# Patient Record
Sex: Male | Born: 1950
Health system: Southern US, Community
[De-identification: ages and names within clinical notes are randomized; demographics above are authoritative.]

## PROBLEM LIST (undated history)

## (undated) DIAGNOSIS — K429 Umbilical hernia without obstruction or gangrene: Secondary | ICD-10-CM

## (undated) DIAGNOSIS — E119 Type 2 diabetes mellitus without complications: Secondary | ICD-10-CM

## (undated) DIAGNOSIS — I10 Essential (primary) hypertension: Secondary | ICD-10-CM

## (undated) DIAGNOSIS — J302 Other seasonal allergic rhinitis: Secondary | ICD-10-CM

## (undated) DIAGNOSIS — E7800 Pure hypercholesterolemia, unspecified: Secondary | ICD-10-CM

## (undated) HISTORY — DX: Type 2 diabetes mellitus without complications: E11.9

---

## 2001-02-27 ENCOUNTER — Encounter: Payer: Self-pay | Admitting: Family Medicine

## 2001-02-27 ENCOUNTER — Ambulatory Visit (HOSPITAL_COMMUNITY): Admission: RE | Admit: 2001-02-27 | Discharge: 2001-02-27 | Payer: Self-pay | Admitting: Family Medicine

## 2001-03-06 ENCOUNTER — Ambulatory Visit (HOSPITAL_COMMUNITY): Admission: RE | Admit: 2001-03-06 | Discharge: 2001-03-06 | Payer: Self-pay | Admitting: Family Medicine

## 2001-03-06 ENCOUNTER — Encounter: Payer: Self-pay | Admitting: Family Medicine

## 2002-06-03 ENCOUNTER — Ambulatory Visit (HOSPITAL_COMMUNITY): Admission: RE | Admit: 2002-06-03 | Discharge: 2002-06-03 | Payer: Self-pay | Admitting: Family Medicine

## 2002-06-03 ENCOUNTER — Encounter: Payer: Self-pay | Admitting: Family Medicine

## 2004-06-22 ENCOUNTER — Ambulatory Visit (HOSPITAL_COMMUNITY): Admission: RE | Admit: 2004-06-22 | Discharge: 2004-06-22 | Payer: Self-pay | Admitting: Family Medicine

## 2005-02-22 ENCOUNTER — Ambulatory Visit (HOSPITAL_COMMUNITY): Admission: RE | Admit: 2005-02-22 | Discharge: 2005-02-22 | Payer: Self-pay | Admitting: Gastroenterology

## 2005-08-31 ENCOUNTER — Ambulatory Visit (HOSPITAL_COMMUNITY): Admission: RE | Admit: 2005-08-31 | Discharge: 2005-08-31 | Payer: Self-pay | Admitting: Internal Medicine

## 2006-05-20 ENCOUNTER — Emergency Department (HOSPITAL_COMMUNITY): Admission: EM | Admit: 2006-05-20 | Discharge: 2006-05-20 | Payer: Self-pay | Admitting: Emergency Medicine

## 2007-12-17 ENCOUNTER — Inpatient Hospital Stay (HOSPITAL_COMMUNITY): Admission: RE | Admit: 2007-12-17 | Discharge: 2007-12-20 | Payer: Self-pay | Admitting: Orthopedic Surgery

## 2007-12-17 HISTORY — PX: TOTAL HIP ARTHROPLASTY: SHX124

## 2008-01-31 ENCOUNTER — Encounter (HOSPITAL_COMMUNITY): Admission: RE | Admit: 2008-01-31 | Discharge: 2008-03-01 | Payer: Self-pay | Admitting: Orthopedic Surgery

## 2008-03-04 ENCOUNTER — Encounter (HOSPITAL_COMMUNITY): Admission: RE | Admit: 2008-03-04 | Discharge: 2008-04-08 | Payer: Self-pay | Admitting: Orthopedic Surgery

## 2008-04-10 ENCOUNTER — Ambulatory Visit: Admission: RE | Admit: 2008-04-10 | Discharge: 2008-04-10 | Payer: Self-pay | Admitting: Orthopedic Surgery

## 2008-06-10 ENCOUNTER — Emergency Department (HOSPITAL_COMMUNITY): Admission: EM | Admit: 2008-06-10 | Discharge: 2008-06-10 | Payer: Self-pay | Admitting: Emergency Medicine

## 2008-06-16 ENCOUNTER — Ambulatory Visit (HOSPITAL_BASED_OUTPATIENT_CLINIC_OR_DEPARTMENT_OTHER): Admission: RE | Admit: 2008-06-16 | Discharge: 2008-06-16 | Payer: Self-pay | Admitting: Orthopedic Surgery

## 2008-06-16 HISTORY — PX: ORIF PATELLA FRACTURE: SUR947

## 2008-08-05 ENCOUNTER — Encounter (HOSPITAL_COMMUNITY): Admission: RE | Admit: 2008-08-05 | Discharge: 2008-09-11 | Payer: Self-pay | Admitting: Orthopedic Surgery

## 2009-08-20 IMAGING — CR DG CHEST 2V
2 series · 2 of 2 positions shown · non-contrast
Comparison: 06/22/2004

CLINICAL DATA: Preoperative respiratory exam for hip surgery.
Hypertension.  Smoking history.

CHEST - 2 VIEW

[view not recorded (1 of 2)]
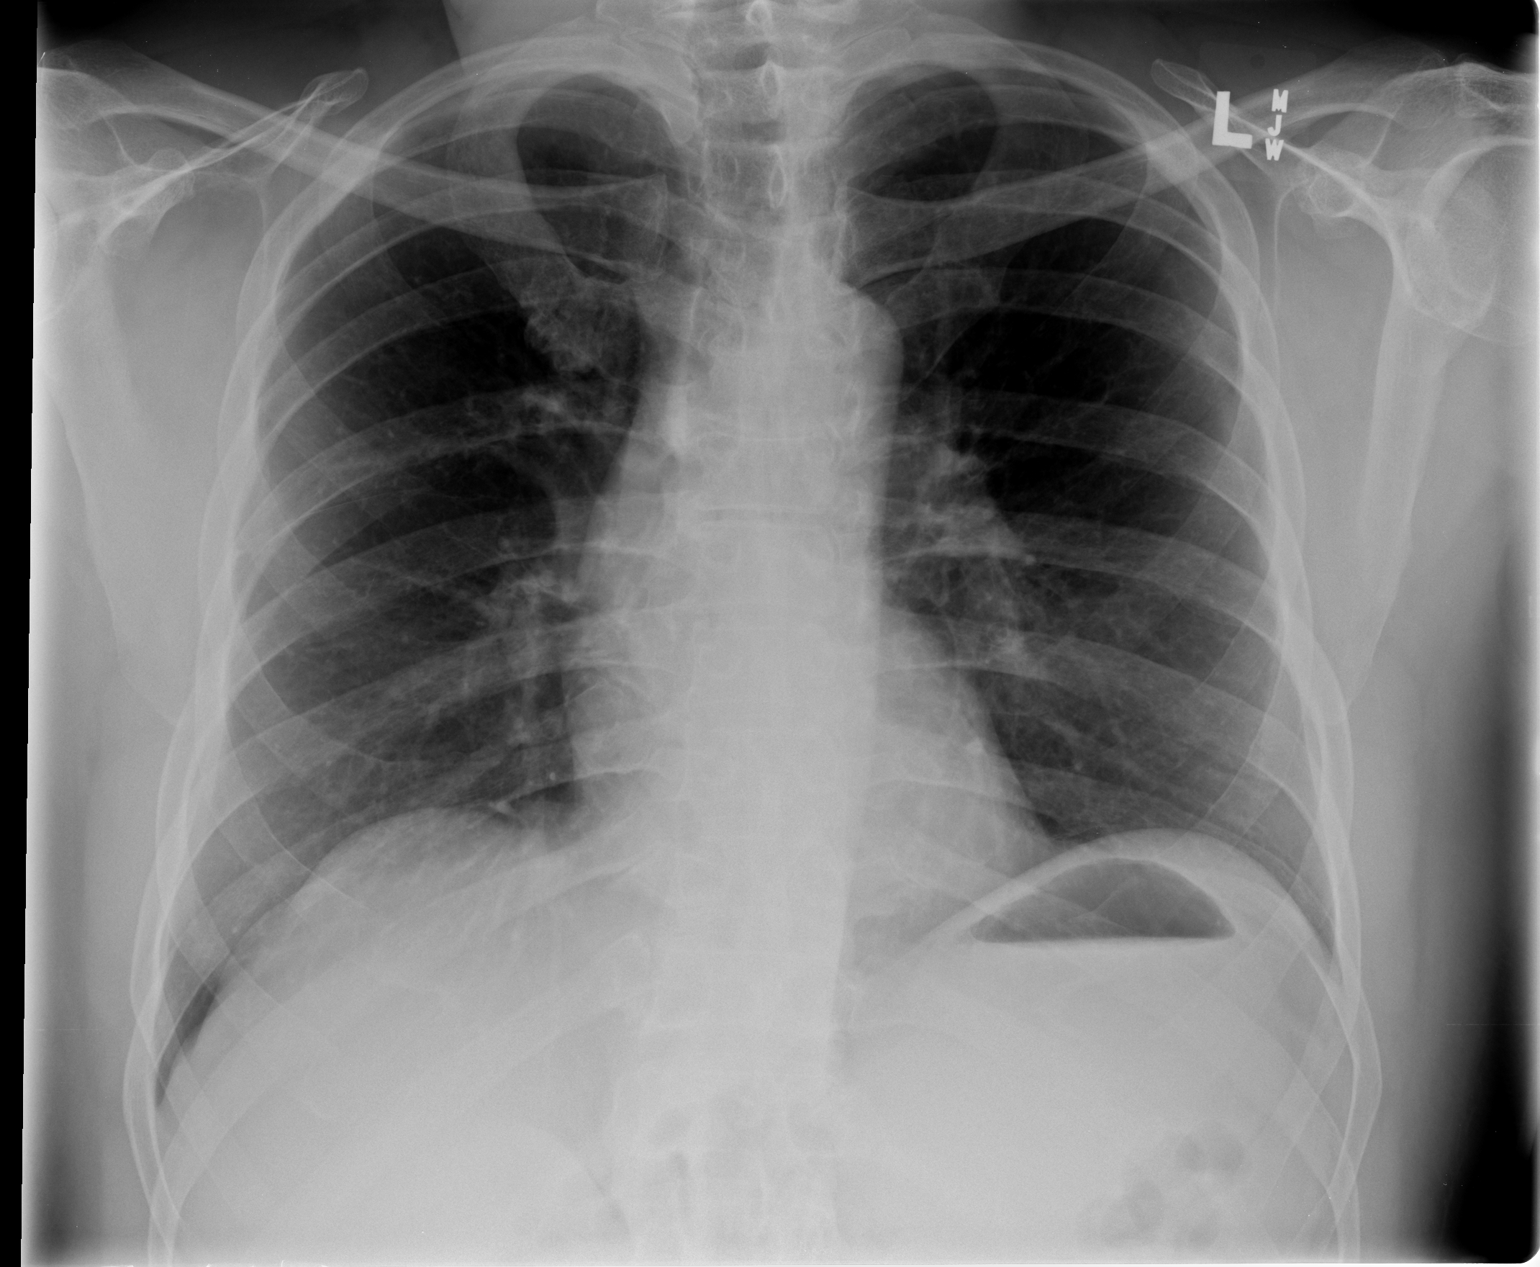

[view not recorded (2 of 2)]
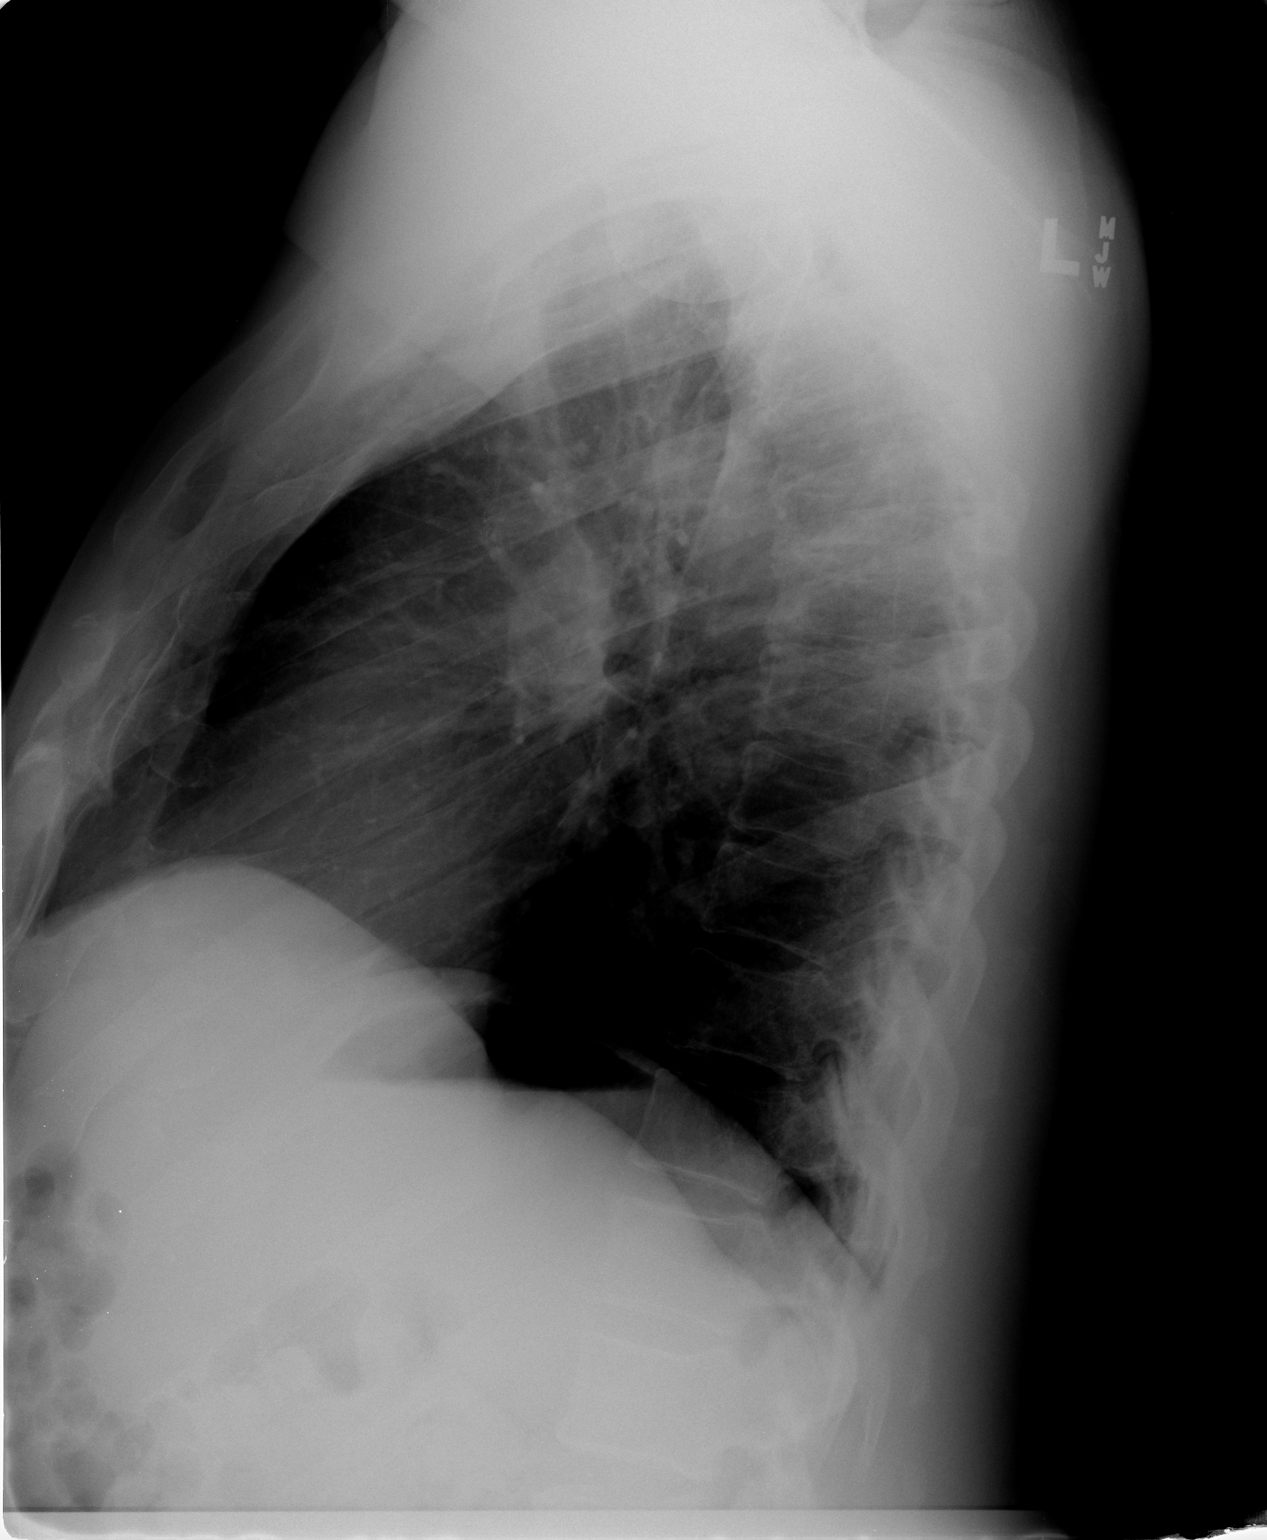

[2 of 2 positions shown; findings below may reference images not displayed]

FINDINGS: Heart size is normal.  There is mild pulmonary scarring
at the bases.  No active infiltrate, mass, effusion or collapse.
No significant bony finding.
IMPRESSION: No active disease

## 2010-02-18 IMAGING — CR DG KNEE COMPLETE 4+V*R*
4 series · 4 of 4 positions shown · non-contrast
Comparison: None.

CLINICAL DATA: Fell and injured right knee.

RIGHT KNEE - COMPLETE 4+ VIEW 06/10/2008:

[view not recorded (1 of 4)]
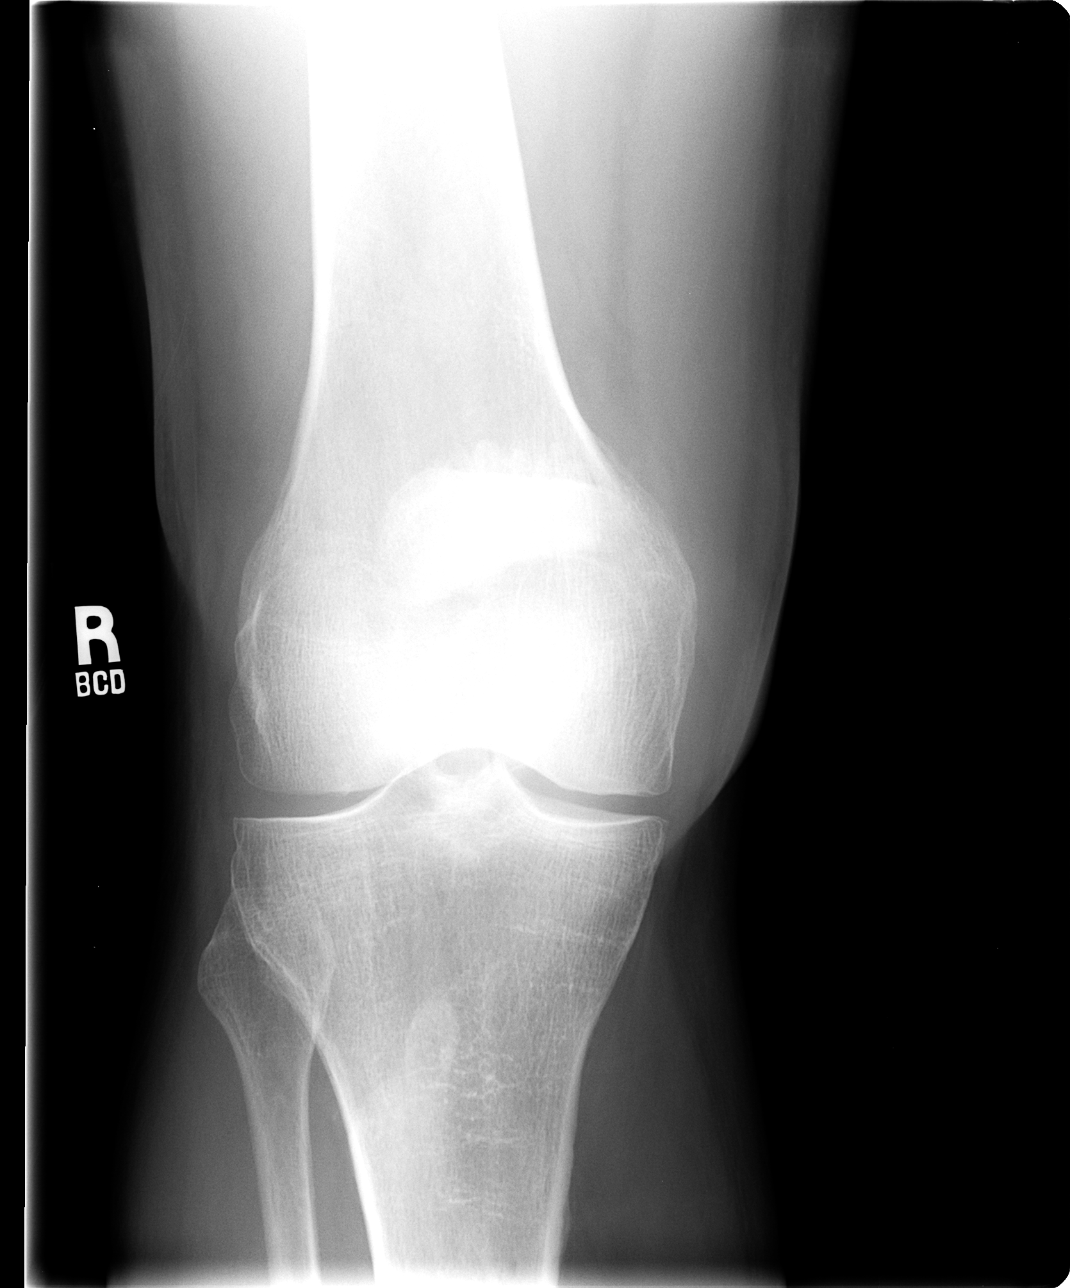

[view not recorded (2 of 4)]
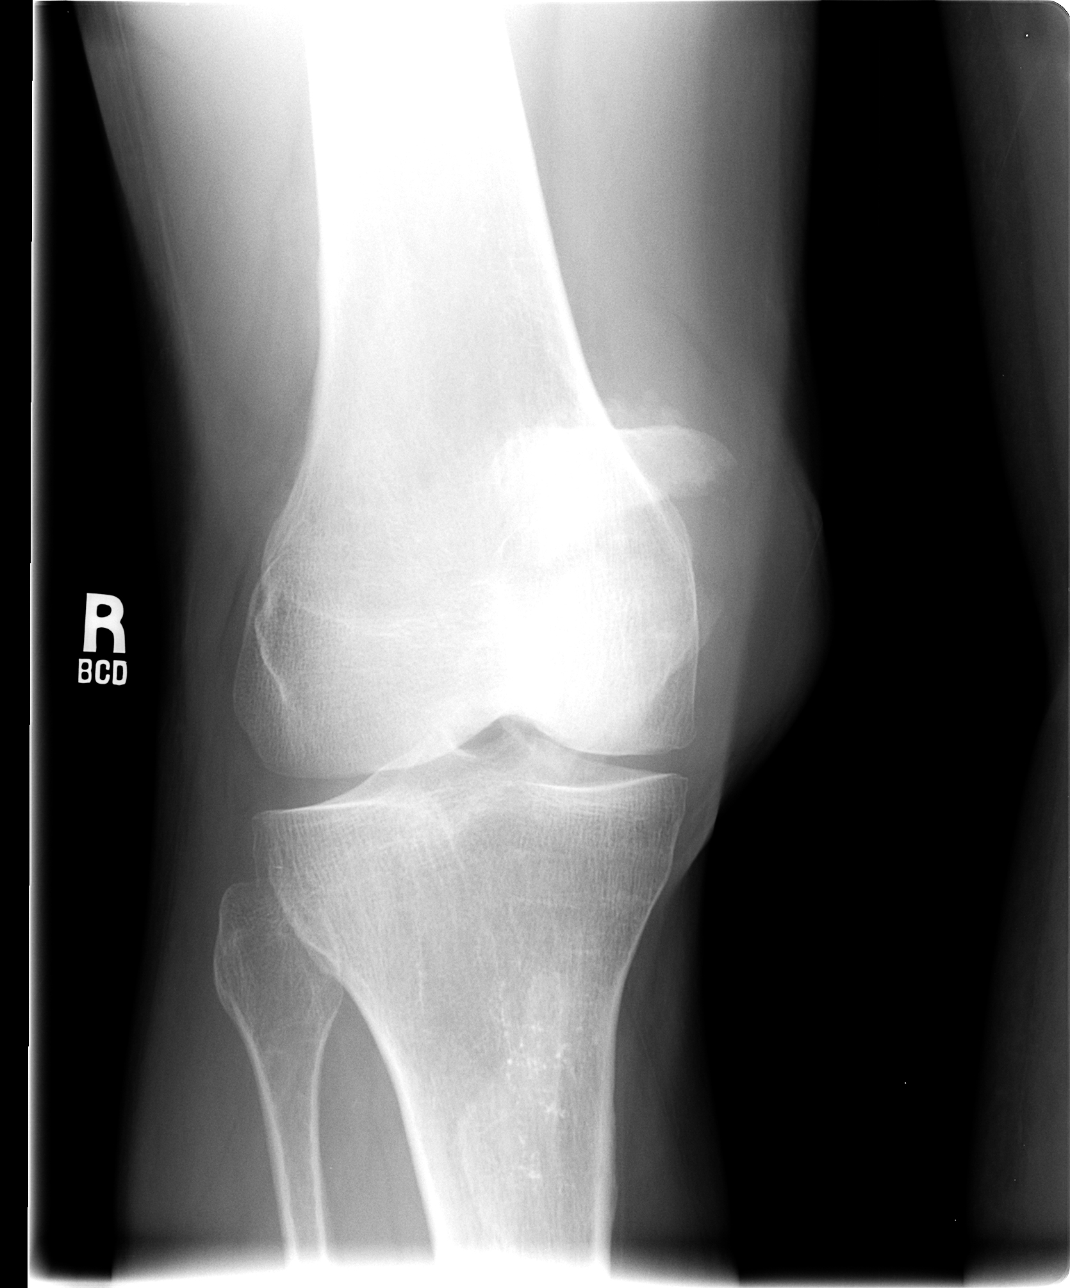

[view not recorded (3 of 4)]
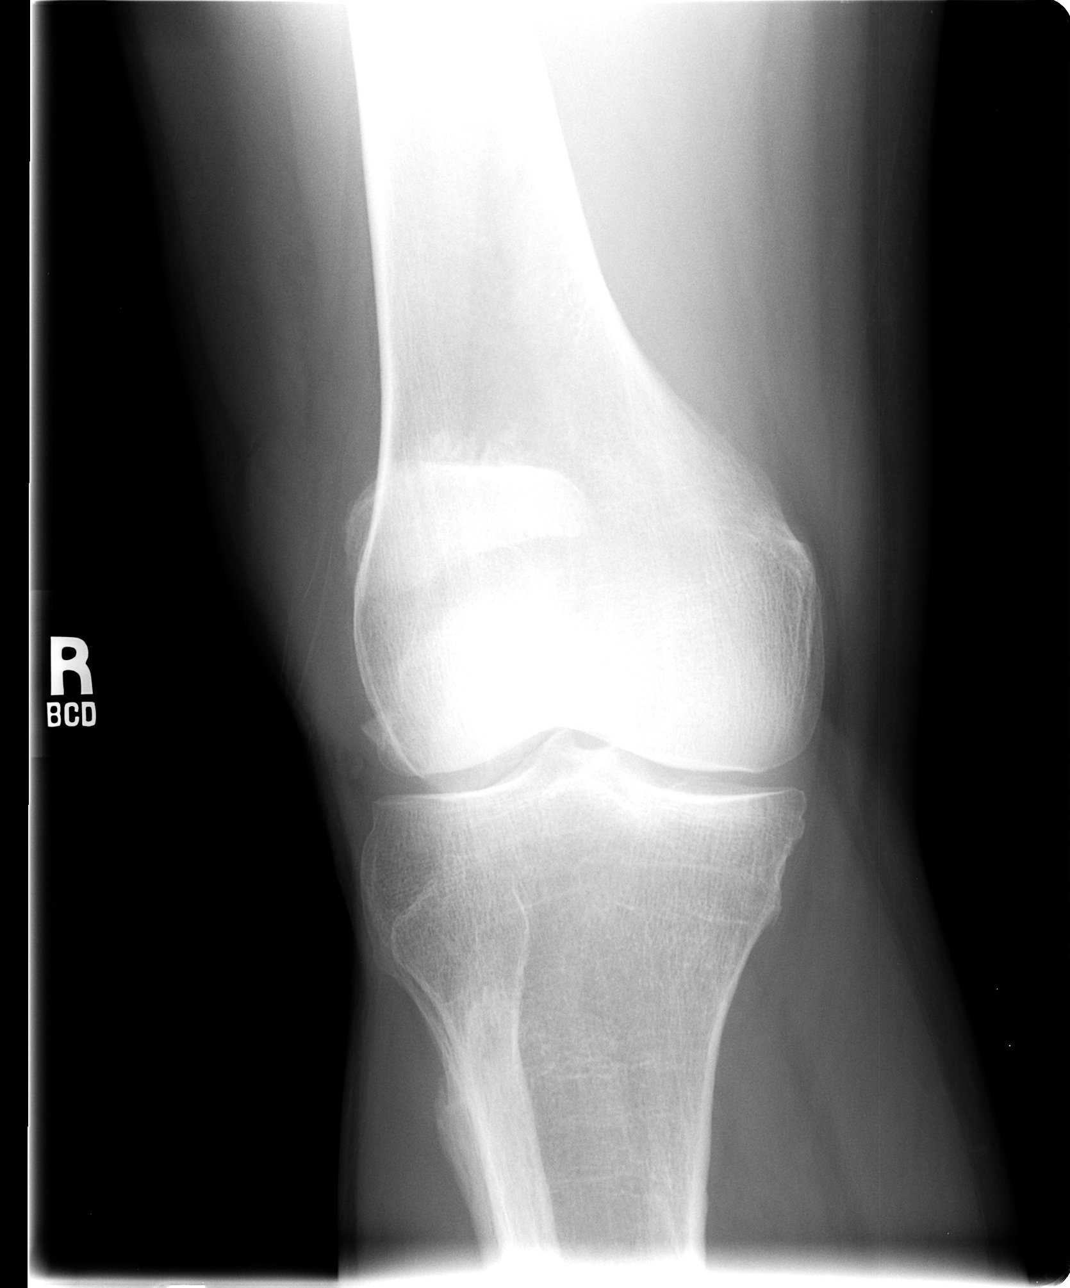

[view not recorded (4 of 4)]
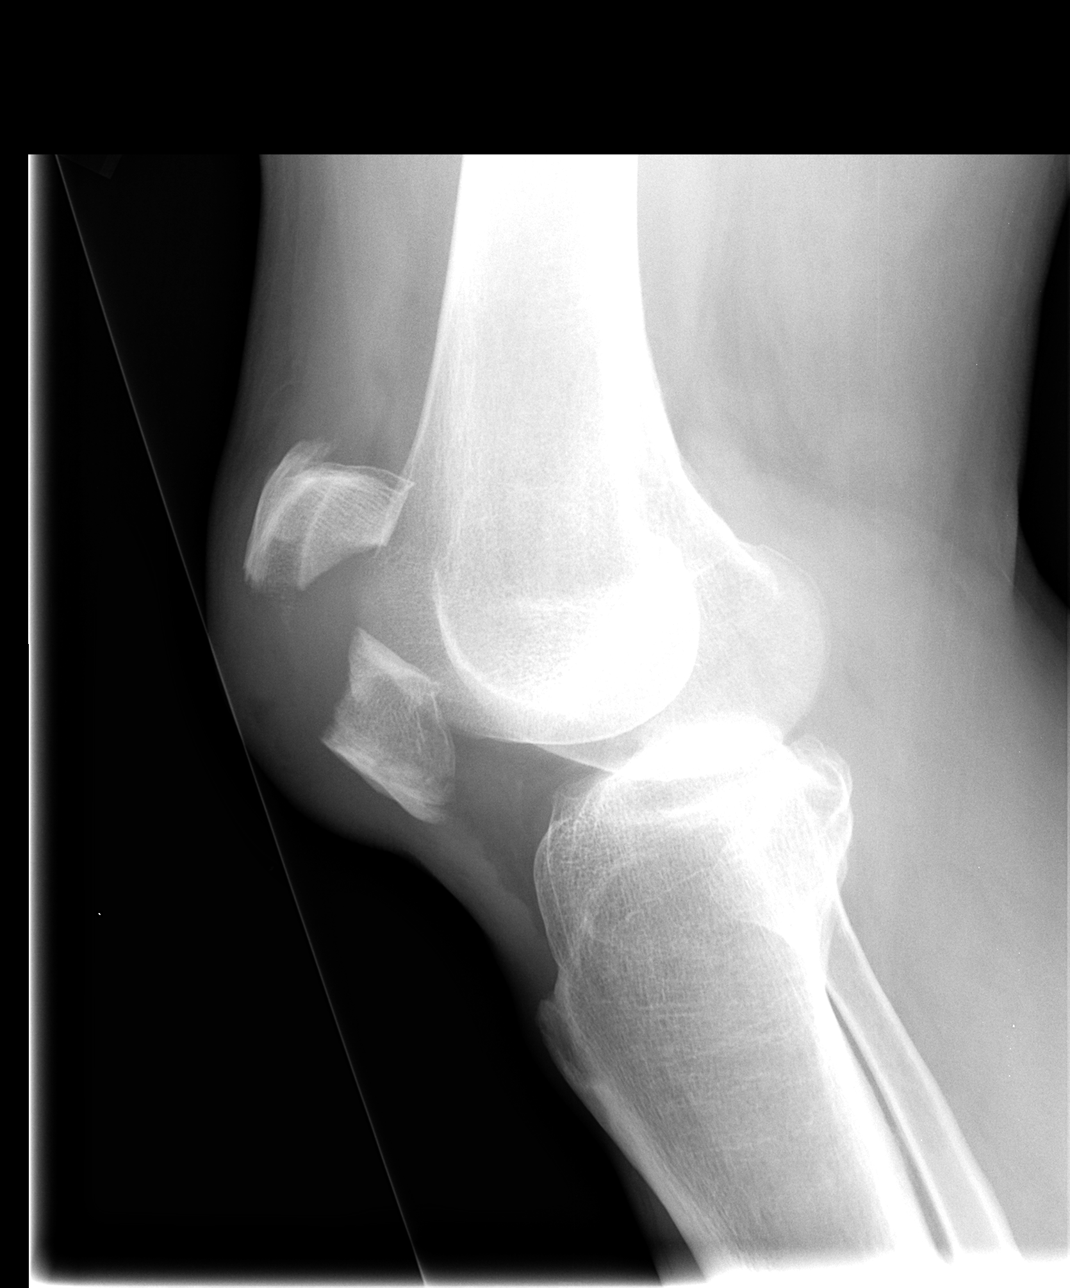

[4 of 4 positions shown; findings below may reference images not displayed]

FINDINGS: Fracture through the mid portion of the patella, with
just over 2 cm of distraction of the fracture fragments. Very large
joint effusion/hemarthrosis, and large hematoma in the subcutaneous
tissues anterior to the knee.  Ossification at the insertion of the
patellar and quadriceps tendons on both the inferior and superior
patella.  Prominent tibial tubercle.  No fractures elsewhere.
IMPRESSION: Fracture through the mid portion of the patella with just over 2 cm
of distraction of the fracture fragments.

## 2010-11-23 NOTE — Op Note (Signed)
NAMEGOR, Andres [name]               ACCOUNT NO.:  [id number]   MEDICAL RECORD NO.:  [id number]          PATIENT TYPE:  AMB   LOCATION:  DSC                          FACILITY:  [facility]   PHYSICIAN:  Feliberto Gottron. Turner Daniels, M.D.   DATE OF BIRTH:  October 03, 1950   DATE OF PROCEDURE:  06/16/2008  DATE OF DISCHARGE:                               OPERATIVE REPORT   PREOPERATIVE DIAGNOSIS:  Right patella fracture, displaced.   POSTOPERATIVE DIAGNOSIS:  Right patella fracture, displaced.   PROCEDURE:  Open reduction and internal fixation right patella fracture  using two parallel 0.062 K-wires and a double figure-of-eight #5  FiberWire loop.   SURGEON:  Feliberto Gottron.  Turner Daniels, MD   FIRST ASSISTANT:  Shirl Harris PA-C   ANESTHETIC:  General LMA.   ESTIMATED BLOOD LOSS:  100 mL.   FLUID REPLACEMENT:  1500 mL of crystalloid.   DRAINS PLACED:  None.   TOURNIQUET TIME:  45 minutes.   INDICATIONS FOR PROCEDURE:  A 60 year old man who fell and sustained a  displaced right patella fracture about 6 days ago.  Placed in a knee  immobilizer and prepared for reduction and stabilization of his patella.  He has no quad mechanism unless the patella is fixed.  Risks and  benefits of surgery were discussed and questions answered.   DESCRIPTION OF PROCEDURE:  The patient identified by armband, taken to  the operating room at [facility] after first receiving 2 g  of Ancef IV.  Appropriate anesthetic monitors were attached and general  LMA anesthesia induced with the patient in supine position.  A  tourniquet was applied high to the right thigh and the right lower  extremity prepped and draped in usual sterile fashion from the ankle to  the tourniquet.  Standard time-out procedure was then performed.  The  limb wrapped with an Esmarch bandage, tourniquet inflated to 350 mmHg.  The knee bent and we began the procedure by making the anterior midline  incision starting about 4 cm above the patella going  over the patella  and about 4 cm distal to the patella at the level of the tibial  tubercle.  Small bleeders in the skin and subcutaneous tissue identified  and cauterized.  Transverse retinaculum was reflected medially and  laterally exposing the fracture.  Early callus material was removed from  the fracture site as was periosteum and the edges were cleaned up  allowing basically an anatomic reduction.  It was primarily just a two-  part fracture with some small fragments laterally that were easily  debrided.  Indirect reduction was accomplished lining up the external  edges of the patella.  We could easily pass our finger into the joint  from the retinacular tears medially and laterally to confirm the  reduction.  Parallel 0.062 K-wires were then placed from superior in the  patella through the fracture site coming out inferiorly, one medial and  one lateral.  This provisionally stabilized the fracture and again there  was near anatomic to palpation palpating through the retinaculum and  cartilage.  We then fixed this with  two figure-of-eight #5 FiberWire  throwing at least 7-1/2 inches in each figure-of-eight loop after  bending the knee and tensioning it with two 1/2 inches.  After the  figure-of-eight FiberWire loops had been applied, the ends of the pins  were bent and cut at 90 degrees and then buried into the tendon.  C-arm  images were taken on the lateral plane confirming baseplate near  anatomic reduction.  They were taken at 0 and 45 degrees of flexion and  at 45 degrees flexion, the tension band were perfectly further reducing  the fracture from less than a millimeter to near invisible.  At this  point, the tourniquet was let down.  The wound irrigated out with normal  saline solution.  The retinacular tears medially and laterally were  closed with running #1 Vicryl suture, the subcutaneous tissue with 0  running undyed Vicryl suture and 2-0 undyed Vicryl suture and the  skin  with skin staples.  A dressing of Xeroform, 4x4 dressing sponges,  Webril, Ace wrap, a cool wrap and a knee immobilizer were then applied.  The patient was then awakened and taken to the recovery room without  difficulty.      Feliberto Gottron. Turner Daniels, M.D.  Electronically Signed     FJR/MEDQ  D:  06/16/2008  T:  06/17/2008  Job:  [id number]

## 2010-11-23 NOTE — Op Note (Signed)
NAMEDEMPSEY, AHONEN               ACCOUNT NO.:  [id number]   MEDICAL RECORD NO.:  [id number]          PATIENT TYPE:  INP   LOCATION:  5006                         FACILITY:  [facility]   PHYSICIAN:  Feliberto Gottron. Turner Daniels, M.D.   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  This would be a complex total hip.   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  This would be a complex total hip.   PROCEDURE:  Total hip arthroplasty using a DePuy 58-mm ASR cup, 22 x 19  x 175 x 42 inches S-ROM stem, 22D large cone, and NK+0 51-mm Ultimate  head.   SURGEON:  Feliberto Gottron.  Turner Daniels, MD   FIRST ASSISTANT:  Shirl Harris, PA-C   ANESTHESIA:  General endotracheal.   ESTIMATED BLOOD LOSS:  500 mL.   FLUID REPLACEMENT:  1500 mL of crystalloid.   URINE OUTPUT:  300 mL.   DRAINS:  Foley catheter.   INDICATIONS FOR PROCEDURE:  This is a 60 year old male with end-stage  arthritis of his left hip secondary to an injury as a child which has  left him with bone-on-bone arthritic changes, huge peripheral  osteophytes, and little for any motion.  The hip joint has at least a 45-  degree flexion contracture and severe unremitting pain.  In order to  decrease pain and increase function, he desires elective left total hip  arthroplasty.  Risks and benefits of surgery were discussed.  Questions  answered.   DESCRIPTION OF PROCEDURE:  The patient was identified by armband and  taken to the operating room at [facility].  Appropriate  anesthetic  monitors were attached and general endotracheal anesthesia  induced with the patient in supine position.  He was then rolled into  the right lateral decubitus position and fixed there with a Stulberg  Mark II pelvic clamp.  After insertion of a Foley catheter, he did  receive IV antibiotics preoperatively.  Once the pelvis was in the  vertical in the Stulberg clamp, the left lower extremity was prepped and  draped in usual sterile fashion from the ankle to the hemipelvis and the  skin along the lateral hip and thigh infiltrated with 20 mL of  0.5%Marcaine and epinephrine solution.  We then began the procedure by  making a posterolateral approach with the hip centered over the greater  trochanter 20 cm in length cutting through the skin and subcutaneous  tissue down to the level of the IT band which was then cut along with  skin incision exposing the greater trochanter.  A Cobra retractor was  then placed between the gluteus minimus and the superior hip joint  capsule which include a large superior osteophyte and a second between  the inferior hip joint capsule and the quadratus femoris, again  including a large osteophyte.  This allowed [place] to isolate the piriformis  and short external rotators which were then cut off their insertion on  the intertrochanteric crest.  Likewise, we incised the capsule posterior-  superior going out over the femoral neck and then out inferior-posterior  and tagged this with two #2 Ethibond sutures.  This exposed the  posterior aspect of the acetabulum which had a very large peripheral  osteophyte that had to be removed using an osteotome before we could  even see the femoral head and neck.  This went all the way from superior  to inferior and required 2 passes with the osteotome, removing about 1/2  inch of bone around the rim.  Once this had been accomplished, we  further loosened up the soft tissue superiorly and inferiorly and then  flexed, internally  rotated the hip dislocating the femoral head which  was grossly misshapen.  A standard neck cut was then performed 1  fingerbreadth above the lesser trochanter, giving [place] a good exposure of  the acetabulum.  More peripheral osteophytes were taken out anteriorly,  some up to an inch in size enhancing our exposure of the anterior column  in the anterior aspect of the acetabulum.  A posterior-inferior ring  retractor was then placed at the junction of the ischium and the  acetabulum and inferior Cobra and a superior Homan levering off the  anterior column bringing the femur forward.  We then sequentially reamed  up to a 57-mm basket reamer, touched up the edge with a 58-mm reamer in  preparation for a 58-mm ASR cup which was then inserted with the sharp  hammer blows and 45 degrees of abduction and about 20 degrees of  anteversion.  Even after the cup had been inserted, we removed more  osteophytes some up to 1/2 inch in length from anterior, posterior, and  inferior which also would allow for much better motion of the femur.  The hip was then flexed and internally rotated exposing the proximal  femur.  We opened with a box cutting chisel followed by the initiator  followed by axial reaming up to a 17-mm axial reamer, getting our first  shatter at 16.5.  We then reamed up to 17.5 full depth and touched the  proximal end of the femur for the first entry so with the 18 mm reamer.  We then conically milled up to a 22D cone to the appropriate depth for a  42 base neck and then calcar milled up to a 20D large calcar.  A 20D  large cone was then hammered into place followed by trial stem which had  an extra 20 degrees of anteversion built into it because of the  patient's deformity from childhood.  We then performed a trial reduction  using an NK+0 51-mm head.  This reduced nicely and could not be  dislocated in external rotation and extension.  The knee could be flexed  in 120 degrees extension  and then as far as flexion goes, it could flex  to 90 and internally rotate to 60 or 70 before there was any  instability.  At this point, the trial components were removed.  A 22D  large cone was inserted in the proximal femur.  We reamed through the  cone with a 17.5 reamer and then inserted a 22 x 17  x 175 x 42 stem in  20 degrees of anteversion in relation to the calcar and this seated  nicely.  An NK+0 51-mm ball was then hammered onto the stem.  The hip  was once again reduced and taken through range of motion with excellent  stability.  The wound was irrigated out with normal saline solution.  The capsule and short external rotators were repaired back to the  intertrochanteric crest through drill holes.  The IT band was closed  with running #1 Vicryl suture, the subcutaneous tissue with 0 and 2-0  undyed Vicryl suture, and the skin with running interlocking 3-0 nylon  suture.  Dressing of Xeroform and Mepilex was then applied.  The patient  was unclamped, rolled supine, awakened, and taken to the recovery room  without difficulty.  Postoperative x-rays show well-placed, well-fixed  prosthesis with no dislocation.      Feliberto Gottron. Turner Daniels, M.D.  Electronically Signed     FJR/MEDQ  D:  12/17/2007  T:  12/18/2007  Job:  [id number]

## 2010-11-26 NOTE — Discharge Summary (Signed)
NAMECLOYDE, OREGEL               ACCOUNT NO.:  [id number]   MEDICAL RECORD NO.:  [id number]          PATIENT TYPE:  INP   LOCATION:  5006                         FACILITY:  [facility]   PHYSICIAN:  Feliberto Gottron. Turner Daniels, M.D.   DATE OF BIRTH:  07/10/1951   DATE OF ADMISSION:  12/17/2007  DATE OF DISCHARGE:  12/20/2007                               DISCHARGE SUMMARY   CHIEF COMPLAINT:  Left hip pain.   HISTORY OF PRESENT ILLNESS:  Mr. Andres [name] is a 60 year old man who  sustained a traumatic injury to his left hip many years ago and  secondarily developed posttraumatic arthritis.  He is now having  unremitting hip pain in spite of conservative treatment including  physical therapy, nonsteroidal antiinflammatory drugs, and narcotic pain  medicines.  He desires a surgical intervention at this time.   PAST MEDICAL HISTORY:  Significant for hypertension and high  cholesterol.   PAST SURGICAL HISTORY:  Unremarkable.   PAST SOCIAL HISTORY:  He smokes half a pack of cigarettes per day and  drinks occasional alcohol.   FAMILY HISTORY:  Significant for hypertension.   ALLERGIES:  He has no known drug allergies.   CURRENT MEDICATIONS:  Allegra, Crestor, lisinopril, fluticasone cream  p.r.n., and multivitamin.   PHYSICAL EXAMINATION:  MUSCULOSKELETAL:  Examination of the left hip  demonstrates the patient to have flexion to approximately 50 degrees.  He has 0 degree of internal and external rotation.  Positive foot tap.  He walks with an antalgic gait.  He is neurovascularly intact.  X-rays  demonstrate bone-on-bone degenerative joint disease of the left hip with  collapse of the femoral head.   PREOPERATIVE LABORATORY DATA:  White blood cells 6.8, red blood cells  4.34, hemoglobin 13.8, hematocrit 40.2, and platelets 189.  PT 13.2, INR  1.0.  His urinalysis was within normal limits.   HOSPITAL COURSE:  He was admitted to [facility] on December 17, 2007, when  he underwent a total hip arthroplasty  on the left side.  He tolerated  the procedure well.  On the first operative day, his Foley catheter was  removed and his pain pump was discontinued.  On the second postoperative  day, he was eating well and walking with physical therapy.  On the third  postoperative day, he was ambulating independently and eating well.  No  drainage was noted from his surgical wound, so he was discharged to his  home.   DISPOSITION:  This patient was discharged to home on December 20, 2007.  Genevieve Norlander will be managing his wound, physical therapy, and Coumadin.   DISCHARGE MEDICATIONS:  Will be as per the HMR with the addition of  Percocet 5 mg tablets 1-2 tabs p.o. q.4 h. p.r.n. pain, and Coumadin 5  mg tablets, take as directed.   DISCHARGE INSTRUCTIONS:  He will be weightbearing as tolerated with the  walker and will return to the clinic to see Dr. Turner Daniels in one week.   FINAL DIAGNOSIS:  End-stage degenerative joint disease of the left hip.      Shirl Harris, [place]  Feliberto Gottron. Turner Daniels, M.D.  Electronically Signed    JW/MEDQ  D:  12/21/2007  T:  12/22/2007  Job:  [id number]

## 2011-04-07 LAB — CBC
HCT: 26 — ABNORMAL LOW
HCT: 27.6 — ABNORMAL LOW
HCT: 32 — ABNORMAL LOW
HCT: 40.2
Hemoglobin: 11 — ABNORMAL LOW
Hemoglobin: 13.8
Hemoglobin: 8.9 — ABNORMAL LOW
Hemoglobin: 9.6 — ABNORMAL LOW
MCHC: 34.3
MCHC: 34.4
MCHC: 34.4
MCHC: 34.8
MCV: 92.3
MCV: 92.5
MCV: 92.7
MCV: 93
Platelets: 147 — ABNORMAL LOW
Platelets: 149 — ABNORMAL LOW
Platelets: 176
Platelets: 189
RBC: 2.79 — ABNORMAL LOW
RBC: 2.99 — ABNORMAL LOW
RBC: 3.45 — ABNORMAL LOW
RBC: 4.34
RDW: 12.8
RDW: 12.9
RDW: 12.9
RDW: 13.1
WBC: 12.6 — ABNORMAL HIGH
WBC: 13.7 — ABNORMAL HIGH
WBC: 16.8 — ABNORMAL HIGH
WBC: 6.8

## 2011-04-07 LAB — BASIC METABOLIC PANEL
BUN: 10
BUN: 14
CO2: 27
CO2: 29
Calcium: 8 — ABNORMAL LOW
Calcium: 9.1
Chloride: 103
Chloride: 99
Creatinine, Ser: 1.18
Creatinine, Ser: 1.24
GFR calc non Af Amer: >60
GFR calc non Af Amer: >60
Glucose, Bld: 111 — ABNORMAL HIGH
Glucose, Bld: 122 — ABNORMAL HIGH
Potassium: 4
Potassium: 4.3
Sodium: 132 — ABNORMAL LOW
Sodium: 139

## 2011-04-07 LAB — DIFFERENTIAL
Basophils Absolute: 0
Basophils Relative: 0
Eosinophils Absolute: 0.5
Eosinophils Relative: 8 — ABNORMAL HIGH
Lymphocytes Relative: 20
Lymphs Abs: 1.3
Monocytes Absolute: 0.5
Monocytes Relative: 8
Neutro Abs: 4.4
Neutrophils Relative %: 65

## 2011-04-07 LAB — PROTIME-INR
INR: 1
INR: 1.1
INR: 1.4
INR: 1.5
Prothrombin Time: 13.2
Prothrombin Time: 14.5
Prothrombin Time: 17.8 — ABNORMAL HIGH
Prothrombin Time: 18.9 — ABNORMAL HIGH

## 2011-04-07 LAB — URINALYSIS, ROUTINE W REFLEX MICROSCOPIC
Bilirubin Urine: NEGATIVE
Glucose, UA: NEGATIVE
Hgb urine dipstick: NEGATIVE
Ketones, ur: NEGATIVE
Nitrite: NEGATIVE
Protein, ur: NEGATIVE
Specific Gravity, Urine: 1.025
Urobilinogen, UA: 0.2
pH: 5.5

## 2011-04-07 LAB — ABO/RH: ABO/RH(D): O POS

## 2011-04-07 LAB — TYPE AND SCREEN
ABO/RH(D): O POS
Antibody Screen: NEGATIVE

## 2011-04-07 LAB — APTT: aPTT: 32

## 2011-04-15 LAB — POCT I-STAT, CHEM 8
BUN: 17 mg/dL (ref 6–23)
Calcium, Ion: 1.14 mmol/L (ref 1.12–1.32)
Chloride: 100 mEq/L (ref 96–112)
Creatinine, Ser: 1.3 mg/dL (ref 0.4–1.5)
Glucose, Bld: 104 mg/dL — ABNORMAL HIGH (ref 70–99)
HCT: 41 % (ref 39.0–52.0)
Hemoglobin: 13.9 g/dL (ref 13.0–17.0)
Potassium: 4.5 mEq/L (ref 3.5–5.1)
Sodium: 138 mEq/L (ref 135–145)
TCO2: 27 mmol/L (ref 0–100)

## 2014-01-27 ENCOUNTER — Other Ambulatory Visit: Payer: Self-pay | Admitting: Internal Medicine

## 2014-01-27 DIAGNOSIS — R1900 Intra-abdominal and pelvic swelling, mass and lump, unspecified site: Secondary | ICD-10-CM

## 2014-02-07 ENCOUNTER — Ambulatory Visit
Admission: RE | Admit: 2014-02-07 | Discharge: 2014-02-07 | Disposition: A | Discharge status: Home / Self Care | Payer: Managed Care, Other (non HMO) | Source: Ambulatory Visit | Attending: Internal Medicine | Admitting: Internal Medicine

## 2014-02-07 DIAGNOSIS — R1900 Intra-abdominal and pelvic swelling, mass and lump, unspecified site: Secondary | ICD-10-CM

## 2014-02-07 MED ORDER — IOHEXOL 300 MG/ML  SOLN
125.0000 mL | Freq: Once | INTRAMUSCULAR | Status: AC | PRN
  Administered 2014-02-07: 125 mL via INTRAVENOUS

## 2014-08-15 ENCOUNTER — Ambulatory Visit (INDEPENDENT_AMBULATORY_CARE_PROVIDER_SITE_OTHER): Payer: Self-pay | Admitting: Surgery

## 2014-08-15 NOTE — H&P (Signed)
History of Present Illness Andres [name]. Andres Grismer MD; 08/15/2014 9:44 AM) Patient words: New patient.  The patient is a 64 year old male who presents with an umbilical hernia. Referred by Dr. Glendale Chard for evaluation of umbilical hernia.  This is a 64 year old male who presents with a one-year history of a palpable mass at his umbilicus. This has become slightly larger. He did have one episode where there was severe pain located in this area. When he is supine it does get softer. He denies obstructive symptoms.     CLINICAL DATA: Umbilical mass.  EXAM: CT ABDOMEN AND PELVIS WITH CONTRAST  TECHNIQUE: Multidetector CT imaging of the abdomen and pelvis was performed using the standard protocol following bolus administration of intravenous contrast.  CONTRAST: 123mL OMNIPAQUE IOHEXOL 300 MG/ML SOLN  COMPARISON: None.  FINDINGS: Diffuse fatty infiltration of the liver. Spleen normal. Pancreas is normal. Small duodenum diverticulum abutting the head of the pancreas. No biliary distention. The gallbladder is not distended.  Adrenals are unremarkable. 3.5 cm simple right renal cyst. No obstructing ureteral stone. Bladder is nondistended. Prostate is mildly prominent. Streak artifact in the pelvis from left hip replacement is present making evaluation difficult.  Prominent lymph nodes are noted in the left inguinal region, largest measures 1.7 cm. No iliac or retroperitoneal pathologic lymph nodes are identified. The abdominal aorta is atherosclerotic. No aneurysm. The visceral vessels are patent. Portal vein is patent.  Appendix is unremarkable. Sigmoid colonic diverticulosis is present. No evidence of diverticulitis. Large amount of stool throughout the colon. No evidence of bowel obstruction or free air. Stomach is nondistended. No mesenteric mass. Bilateral small inguinal hernias with herniation of fat only. Tiny umbilical hernia with herniation of fat only. No bowel  herniation. This most likely accounts for the palpable abnormality in this region.  Heart size normal. Lung bases clear. No acute bony abnormality. Degenerative changes lumbosacral spine. Pinpoint sclerotic density right iliac wing. Statistically this is most likely a bone island. No other sclerotic densities are noted to suggest blastic metastatic disease. Left hip replacement. Left gluteal muscular atrophy.  IMPRESSION: 1. Small umbilical hernia with herniation of fat only. No bowel herniation. This most likely accounts for palpable abnormality in the region of the umbilicus. No significant adjacent inflammation. 2. Prominent lymph nodes are noted in the left inguinal region, the largest measures 1.7 cm. Although these may be reactive, malignant adenopathy cannot be excluded 3. Diffuse fatty infiltration of the liver. 4. Sigmoid colonic diverticulosis, no diverticulitis. 5. Left hip replacement. Left gluteal muscular atrophy.   Electronically Signed By: Marcello Moores Register On: 02/07/2014 11:47      Other Problems Sharman Crate, LPN; 1/0/2585 2:77 AM) High blood pressure  Past Surgical History Sharman Crate, LPN; 02/09/4234 3:61 AM) Hip Surgery Left. Knee Surgery Right.  Diagnostic Studies History Sharman Crate, LPN; 10/12/3152 0:08 AM) Colonoscopy 5-10 years ago  Allergies Clarise Cruz Harlan, LPN; 12/15/6193 0:93 AM) No Known Drug Allergies02/11/2014  Medication History Sharman Crate, LPN; 08/16/7122 5:80 AM) Cialis (5MG  Tablet, Oral) Active. Vitamin D3 (1000UNIT Capsule, Oral) Active. Lisinopril-Hydrochlorothiazide (20-12.5MG  Tablet, Oral) Active. Allegra Allergy (60MG  Tablet, Oral) Active. Levocetirizine Dihydrochloride (5MG  Tablet, Oral) Active. Crestor (10MG  Tablet, Oral) Active.  Social History Sharman Crate, LPN; 03/19/8337 2:50 AM) Tobacco use Former smoker.  Family History Sharman Crate, LPN; 11/10/9765 3:41 AM) Arthritis Daughter.  Review of  Systems Clarise Cruz [facility] LPN; 03/13/7901 4:09 AM) General Not Present- Appetite Loss, Chills, Fatigue, Fever, Night Sweats, Weight Gain and Weight Loss. HEENT Not Present- Earache, Hearing  Loss, Hoarseness, Nose Bleed, Oral Ulcers, Ringing in the Ears, Seasonal Allergies, Sinus Pain, Sore Throat, Visual Disturbances, Wears glasses/contact lenses and Yellow Eyes. Respiratory Not Present- Bloody sputum, Chronic Cough, Difficulty Breathing, Snoring and Wheezing. Breast Not Present- Breast Mass, Breast Pain, Nipple Discharge and Skin Changes. Cardiovascular Not Present- Chest Pain, Difficulty Breathing Lying Down, Leg Cramps, Palpitations, Rapid Heart Rate, Shortness of Breath and Swelling of Extremities. Gastrointestinal Not Present- Abdominal Pain, Bloating, Bloody Stool, Change in Bowel Habits, Chronic diarrhea, Constipation, Difficulty Swallowing, Excessive gas, Gets full quickly at meals, Hemorrhoids, Indigestion, Nausea, Rectal Pain and Vomiting. Musculoskeletal Present- Joint Pain. Not Present- Back Pain, Joint Stiffness, Muscle Pain, Muscle Weakness and Swelling of Extremities. Neurological Not Present- Decreased Memory, Fainting, Headaches, Numbness, Seizures, Tingling, Tremor, Trouble walking and Weakness. Psychiatric Not Present- Anxiety, Bipolar, Change in Sleep Pattern, Depression, Fearful and Frequent crying. Endocrine Not Present- Cold Intolerance, Excessive Hunger, Hair Changes, Heat Intolerance, Hot flashes and New Diabetes. Hematology Not Present- Easy Bruising, Excessive bleeding, Gland problems, HIV and Persistent Infections.   Vitals Clarise Cruz [facility] LPN; 07/16/1094 0:45 AM) 08/15/2014 9:22 AM Weight: 246.25 lb Height: 72in Body Surface Area: 2.38 m Body Mass Index: 33.4 kg/m Temp.: 98.64F(Oral)  Pulse: 74 (Regular)  BP: 120/78 (Sitting, Right Arm, Standard)    Physical Exam Rodman Key K. Brelee Renk MD; 08/15/2014 9:44 AM) The physical exam findings are as follows: Note:WDWN in  NAD HEENT: EOMI, sclera anicteric Neck: No masses, no thyromegaly Lungs: CTA bilaterally; normal respiratory effort CV: Regular rate and rhythm; no murmurs Abd: +bowel sounds, soft, non-tender, protruding hernia at right upper side of umbilicus - partially reducible Ext: Well-perfused; no edema Skin: Warm, dry; no sign of jaundice    Assessment & Plan Rodman Key K. Mckinze Poirier MD; 4/0/9811 9:14 AM) UMBILICAL HERNIA WITHOUT OBSTRUCTION AND WITHOUT GANGRENE (553.1  K42.9) Current Plans  Schedule for Surgery -Umbilical hernia repair with mesh. The surgical procedure has been discussed with the patient. Potential risks, benefits, alternative treatments, and expected outcomes have been explained. All of the patient's questions at this time have been answered. The likelihood of reaching the patient's treatment goal is good. The patient understand the proposed surgical procedure and wishes to proceed.    Andres [name]. Georgette Dover, MD, [facility] Surgery  General/ Trauma Surgery  08/15/2014 9:46 AM

## 2014-09-09 DIAGNOSIS — K429 Umbilical hernia without obstruction or gangrene: Secondary | ICD-10-CM

## 2014-09-09 HISTORY — DX: Umbilical hernia without obstruction or gangrene: K42.9

## 2014-09-19 ENCOUNTER — Encounter (HOSPITAL_BASED_OUTPATIENT_CLINIC_OR_DEPARTMENT_OTHER): Payer: Self-pay | Admitting: *Deleted

## 2014-09-19 ENCOUNTER — Encounter (HOSPITAL_COMMUNITY)
Admission: RE | Admit: 2014-09-19 | Discharge: 2014-09-19 | Disposition: A | Discharge status: Home / Self Care | Payer: Managed Care, Other (non HMO) | Source: Ambulatory Visit | Attending: Surgery | Admitting: Surgery

## 2014-09-19 DIAGNOSIS — Z01812 Encounter for preprocedural laboratory examination: Secondary | ICD-10-CM | POA: Diagnosis present

## 2014-09-19 LAB — BASIC METABOLIC PANEL
Anion gap: 6 (ref 5–15)
BUN: 17 mg/dL (ref 6–23)
CO2: 27 mmol/L (ref 19–32)
Calcium: 9.1 mg/dL (ref 8.4–10.5)
Chloride: 105 mmol/L (ref 96–112)
Creatinine, Ser: 1.39 mg/dL — ABNORMAL HIGH (ref 0.50–1.35)
GFR calc Af Amer: 62 mL/min — ABNORMAL LOW (ref >90)
GFR calc non Af Amer: 53 mL/min — ABNORMAL LOW (ref >90)
Glucose, Bld: 117 mg/dL — ABNORMAL HIGH (ref 70–99)
Potassium: 4 mmol/L (ref 3.5–5.1)
Sodium: 138 mmol/L (ref 135–145)

## 2014-09-19 NOTE — Pre-Procedure Instructions (Signed)
EKG performed 07/19/2014, received from [facility].

## 2014-09-19 NOTE — Pre-Procedure Instructions (Addendum)
To go to [facility] for pre-op BMET EKG requested from Dr. Glendale Chard' office

## 2014-09-25 ENCOUNTER — Encounter (HOSPITAL_BASED_OUTPATIENT_CLINIC_OR_DEPARTMENT_OTHER)
Admission: RE | Disposition: A | Discharge status: Home / Self Care | Payer: Self-pay | Source: Ambulatory Visit | Attending: Surgery

## 2014-09-25 ENCOUNTER — Encounter (HOSPITAL_BASED_OUTPATIENT_CLINIC_OR_DEPARTMENT_OTHER): Payer: Self-pay | Admitting: *Deleted

## 2014-09-25 ENCOUNTER — Ambulatory Visit (HOSPITAL_BASED_OUTPATIENT_CLINIC_OR_DEPARTMENT_OTHER): Payer: Managed Care, Other (non HMO) | Admitting: Certified Registered"

## 2014-09-25 ENCOUNTER — Ambulatory Visit (HOSPITAL_BASED_OUTPATIENT_CLINIC_OR_DEPARTMENT_OTHER)
Admission: RE | Admit: 2014-09-25 | Discharge: 2014-09-25 | Disposition: A | Discharge status: Home / Self Care | Payer: Managed Care, Other (non HMO) | Source: Ambulatory Visit | Attending: Surgery | Admitting: Surgery

## 2014-09-25 DIAGNOSIS — Z87891 Personal history of nicotine dependence: Secondary | ICD-10-CM | POA: Diagnosis not present

## 2014-09-25 DIAGNOSIS — K429 Umbilical hernia without obstruction or gangrene: Secondary | ICD-10-CM | POA: Insufficient documentation

## 2014-09-25 DIAGNOSIS — Z7982 Long term (current) use of aspirin: Secondary | ICD-10-CM | POA: Diagnosis not present

## 2014-09-25 DIAGNOSIS — I10 Essential (primary) hypertension: Secondary | ICD-10-CM | POA: Insufficient documentation

## 2014-09-25 HISTORY — PX: UMBILICAL HERNIA REPAIR: SHX196

## 2014-09-25 HISTORY — PX: INSERTION OF MESH: SHX5868

## 2014-09-25 HISTORY — DX: Umbilical hernia without obstruction or gangrene: K42.9

## 2014-09-25 HISTORY — DX: Essential (primary) hypertension: I10

## 2014-09-25 HISTORY — DX: Other seasonal allergic rhinitis: J30.2

## 2014-09-25 HISTORY — DX: Pure hypercholesterolemia, unspecified: E78.00

## 2014-09-25 SURGERY — REPAIR, HERNIA, UMBILICAL, ADULT
Anesthesia: General | Site: Abdomen

## 2014-09-25 MED ORDER — OXYCODONE HCL 5 MG PO TABS
5.0000 mg | ORAL_TABLET | Freq: Once | ORAL | Status: AC | PRN
  Administered 2014-09-25: 5 mg via ORAL

## 2014-09-25 MED ORDER — MORPHINE SULFATE 2 MG/ML IJ SOLN: 2.0000 mg | INTRAMUSCULAR | Status: DC | PRN

## 2014-09-25 MED ORDER — ONDANSETRON HCL 4 MG/2ML IJ SOLN: 4.0000 mg | INTRAMUSCULAR | Status: DC | PRN

## 2014-09-25 MED ORDER — PROMETHAZINE HCL 25 MG/ML IJ SOLN: 6.2500 mg | INTRAMUSCULAR | Status: DC | PRN

## 2014-09-25 MED ORDER — CHLORHEXIDINE GLUCONATE 4 % EX LIQD: 1.0000 "application " | Freq: Once | CUTANEOUS | Status: DC

## 2014-09-25 MED ORDER — HYDROMORPHONE HCL 1 MG/ML IJ SOLN: 0.2500 mg | INTRAMUSCULAR | Status: DC | PRN

## 2014-09-25 MED ORDER — FENTANYL CITRATE 0.05 MG/ML IJ SOLN
INTRAMUSCULAR | Status: DC | PRN
  Administered 2014-09-25 (×2): 50 ug via INTRAVENOUS

## 2014-09-25 MED ORDER — FENTANYL CITRATE 0.05 MG/ML IJ SOLN: 50.0000 ug | INTRAMUSCULAR | Status: DC | PRN

## 2014-09-25 MED ORDER — EPHEDRINE SULFATE 50 MG/ML IJ SOLN
INTRAMUSCULAR | Status: DC | PRN
  Administered 2014-09-25: 10 mg via INTRAVENOUS
  Administered 2014-09-25 (×2): 5 mg via INTRAVENOUS

## 2014-09-25 MED ORDER — MIDAZOLAM HCL 2 MG/2ML IJ SOLN
INTRAMUSCULAR | Status: AC
  Filled 2014-09-25: qty 2

## 2014-09-25 MED ORDER — BUPIVACAINE-EPINEPHRINE (PF) 0.25% -1:200000 IJ SOLN
INTRAMUSCULAR | Status: AC
  Filled 2014-09-25: qty 30

## 2014-09-25 MED ORDER — OXYCODONE-ACETAMINOPHEN 5-325 MG PO TABS: 1.0000 | ORAL_TABLET | ORAL | Status: DC | PRN

## 2014-09-25 MED ORDER — OXYCODONE HCL 5 MG PO TABS
ORAL_TABLET | ORAL | Status: AC
  Filled 2014-09-25: qty 1

## 2014-09-25 MED ORDER — DEXAMETHASONE SODIUM PHOSPHATE 4 MG/ML IJ SOLN
INTRAMUSCULAR | Status: DC | PRN
  Administered 2014-09-25: 10 mg via INTRAVENOUS

## 2014-09-25 MED ORDER — FENTANYL CITRATE 0.05 MG/ML IJ SOLN
INTRAMUSCULAR | Status: AC
  Filled 2014-09-25: qty 6

## 2014-09-25 MED ORDER — BUPIVACAINE-EPINEPHRINE 0.25% -1:200000 IJ SOLN
INTRAMUSCULAR | Status: DC | PRN
  Administered 2014-09-25: 10 mL

## 2014-09-25 MED ORDER — PROPOFOL 10 MG/ML IV BOLUS
INTRAVENOUS | Status: DC | PRN
  Administered 2014-09-25: 200 mg via INTRAVENOUS

## 2014-09-25 MED ORDER — MIDAZOLAM HCL 2 MG/ML PO SYRP: 12.0000 mg | ORAL_SOLUTION | Freq: Once | ORAL | Status: DC | PRN

## 2014-09-25 MED ORDER — LACTATED RINGERS IV SOLN
INTRAVENOUS | Status: DC
  Administered 2014-09-25 (×2): via INTRAVENOUS

## 2014-09-25 MED ORDER — MIDAZOLAM HCL 5 MG/5ML IJ SOLN
INTRAMUSCULAR | Status: DC | PRN
  Administered 2014-09-25: 1 mg via INTRAVENOUS

## 2014-09-25 MED ORDER — MIDAZOLAM HCL 2 MG/2ML IJ SOLN: 1.0000 mg | INTRAMUSCULAR | Status: DC | PRN

## 2014-09-25 MED ORDER — LIDOCAINE HCL (CARDIAC) 20 MG/ML IV SOLN
INTRAVENOUS | Status: DC | PRN
  Administered 2014-09-25: 60 mg via INTRAVENOUS

## 2014-09-25 MED ORDER — ONDANSETRON HCL 4 MG/2ML IJ SOLN
INTRAMUSCULAR | Status: DC | PRN
  Administered 2014-09-25: 4 mg via INTRAVENOUS

## 2014-09-25 MED ORDER — CEFAZOLIN SODIUM-DEXTROSE 2-3 GM-% IV SOLR
INTRAVENOUS | Status: AC
  Filled 2014-09-25: qty 50

## 2014-09-25 MED ORDER — CEFAZOLIN SODIUM-DEXTROSE 2-3 GM-% IV SOLR
2.0000 g | INTRAVENOUS | Status: AC
  Administered 2014-09-25: 2 g via INTRAVENOUS

## 2014-09-25 MED ORDER — OXYCODONE HCL 5 MG/5ML PO SOLN: 5.0000 mg | Freq: Once | ORAL | Status: AC | PRN

## 2014-09-25 SURGICAL SUPPLY — 54 items
APPLICATOR COTTON TIP 6IN STRL (MISCELLANEOUS) IMPLANT
BENZOIN TINCTURE PRP APPL 2/3 (GAUZE/BANDAGES/DRESSINGS) ×2 IMPLANT
BLADE CLIPPER SURG (BLADE) ×2 IMPLANT
BLADE HEX COATED 2.75 (ELECTRODE) ×2 IMPLANT
BLADE SURG 15 STRL LF DISP TIS (BLADE) ×1 IMPLANT
BLADE SURG 15 STRL SS (BLADE) ×1
CANISTER SUCT 1200ML W/VALVE (MISCELLANEOUS) IMPLANT
CHLORAPREP W/TINT 26ML (MISCELLANEOUS) ×2 IMPLANT
COVER BACK TABLE 60X90IN (DRAPES) ×2 IMPLANT
COVER MAYO STAND STRL (DRAPES) ×2 IMPLANT
DECANTER SPIKE VIAL GLASS SM (MISCELLANEOUS) IMPLANT
DRAPE LAPAROTOMY T 102X78X121 (DRAPES) ×2 IMPLANT
DRAPE UTILITY XL STRL (DRAPES) ×2 IMPLANT
DRSG TEGADERM 4X4.75 (GAUZE/BANDAGES/DRESSINGS) ×2 IMPLANT
ELECT REM PT RETURN 9FT ADLT (ELECTROSURGICAL) ×2
ELECTRODE REM PT RTRN 9FT ADLT (ELECTROSURGICAL) ×1 IMPLANT
GLOVE BIO SURGEON STRL SZ7 (GLOVE) ×2 IMPLANT
GLOVE BIOGEL PI IND STRL 7.5 (GLOVE) ×1 IMPLANT
GLOVE BIOGEL PI INDICATOR 7.5 (GLOVE) ×1
GLOVE SURG SS PI 7.0 STRL IVOR (GLOVE) ×2 IMPLANT
GOWN STRL REUS W/ TWL LRG LVL3 (GOWN DISPOSABLE) ×3 IMPLANT
GOWN STRL REUS W/TWL LRG LVL3 (GOWN DISPOSABLE) ×3
MESH VENTRALEX ST 1-7/10 CRC S (Mesh General) ×2 IMPLANT
NDL SAFETY ECLIPSE 18X1.5 (NEEDLE) IMPLANT
NEEDLE HYPO 18GX1.5 SHARP (NEEDLE)
NEEDLE HYPO 25X1 1.5 SAFETY (NEEDLE) ×2 IMPLANT
NS IRRIG 1000ML POUR BTL (IV SOLUTION) ×2 IMPLANT
PACK BASIN DAY SURGERY FS (CUSTOM PROCEDURE TRAY) ×2 IMPLANT
PENCIL BUTTON HOLSTER BLD 10FT (ELECTRODE) ×2 IMPLANT
SLEEVE SCD COMPRESS KNEE MED (MISCELLANEOUS) ×2 IMPLANT
SPONGE GAUZE 2X2 8PLY STRL LF (GAUZE/BANDAGES/DRESSINGS) ×2 IMPLANT
SPONGE GAUZE 4X4 12PLY STER LF (GAUZE/BANDAGES/DRESSINGS) IMPLANT
STAPLER VISISTAT 35W (STAPLE) IMPLANT
STRIP CLOSURE SKIN 1/2X4 (GAUZE/BANDAGES/DRESSINGS) ×2 IMPLANT
SUT MNCRL AB 4-0 PS2 18 (SUTURE) ×2 IMPLANT
SUT NOVA 0 T19/GS 22DT (SUTURE) ×4 IMPLANT
SUT NOVA NAB DX-16 0-1 5-0 T12 (SUTURE) ×2 IMPLANT
SUT PDS AB 0 CT 36 (SUTURE) IMPLANT
SUT PROLENE 0 CT 1 CR/8 (SUTURE) IMPLANT
SUT SILK 3 0 TIES 17X18 (SUTURE)
SUT SILK 3-0 18XBRD TIE BLK (SUTURE) IMPLANT
SUT VIC AB 2-0 CT1 27 (SUTURE)
SUT VIC AB 2-0 CT1 TAPERPNT 27 (SUTURE) IMPLANT
SUT VIC AB 3-0 SH 27 (SUTURE) ×1
SUT VIC AB 3-0 SH 27X BRD (SUTURE) ×1 IMPLANT
SUT VIC AB 4-0 BRD 54 (SUTURE) IMPLANT
SUT VIC AB 4-0 SH 18 (SUTURE) IMPLANT
SUT VICRYL 4-0 PS2 18IN ABS (SUTURE) IMPLANT
SYR BULB 3OZ (MISCELLANEOUS) IMPLANT
SYR CONTROL 10ML LL (SYRINGE) ×2 IMPLANT
TOWEL OR 17X24 6PK STRL BLUE (TOWEL DISPOSABLE) ×2 IMPLANT
TOWEL OR NON WOVEN STRL DISP B (DISPOSABLE) ×2 IMPLANT
TUBE CONNECTING 20X1/4 (TUBING) IMPLANT
YANKAUER SUCT BULB TIP NO VENT (SUCTIONS) IMPLANT

## 2014-09-25 NOTE — Anesthesia Procedure Notes (Signed)
Procedure Name: LMA Insertion Date/Time: 09/25/2014 7:39 AM Performed by: Baxter Flattery Pre-anesthesia Checklist: Patient identified, Emergency Drugs available, Suction available and Patient being monitored Patient Re-evaluated:Patient Re-evaluated prior to inductionOxygen Delivery Method: Circle System Utilized Preoxygenation: Pre-oxygenation with 100% oxygen Intubation Type: IV induction Ventilation: Mask ventilation without difficulty LMA: LMA inserted LMA Size: 5.0 Number of attempts: 1 Airway Equipment and Method: Bite block Placement Confirmation: positive ETCO2 and breath sounds checked- equal and bilateral Tube secured with: Tape Dental Injury: Teeth and Oropharynx as per pre-operative assessment

## 2014-09-25 NOTE — H&P (Signed)
History of Present Illness  Patient words: New patient.  The patient is a 64 year old male who presents with an umbilical hernia. Referred by Dr. Glendale Chard for evaluation of umbilical hernia.  This is a 64 year old male who presents with a one-year history of a palpable mass at his umbilicus. This has become slightly larger. He did have one episode where there was severe pain located in this area. When he is supine it does get softer. He denies obstructive symptoms.     CLINICAL DATA: Umbilical mass.  EXAM: CT ABDOMEN AND PELVIS WITH CONTRAST  TECHNIQUE: Multidetector CT imaging of the abdomen and pelvis was performed using the standard protocol following bolus administration of intravenous contrast.  CONTRAST: 126mL OMNIPAQUE IOHEXOL 300 MG/ML SOLN  COMPARISON: None.  FINDINGS: Diffuse fatty infiltration of the liver. Spleen normal. Pancreas is normal. Small duodenum diverticulum abutting the head of the pancreas. No biliary distention. The gallbladder is not distended.  Adrenals are unremarkable. 3.5 cm simple right renal cyst. No obstructing ureteral stone. Bladder is nondistended. Prostate is mildly prominent. Streak artifact in the pelvis from left hip replacement is present making evaluation difficult.  Prominent lymph nodes are noted in the left inguinal region, largest measures 1.7 cm. No iliac or retroperitoneal pathologic lymph nodes are identified. The abdominal aorta is atherosclerotic. No aneurysm. The visceral vessels are patent. Portal vein is patent.  Appendix is unremarkable. Sigmoid colonic diverticulosis is present. No evidence of diverticulitis. Large amount of stool throughout the colon. No evidence of bowel obstruction or free air. Stomach is nondistended. No mesenteric mass. Bilateral small inguinal hernias with herniation of fat only. Tiny umbilical hernia with herniation of fat only. No bowel herniation. This most likely accounts for  the palpable abnormality in this region.  Heart size normal. Lung bases clear. No acute bony abnormality. Degenerative changes lumbosacral spine. Pinpoint sclerotic density right iliac wing. Statistically this is most likely a bone island. No other sclerotic densities are noted to suggest blastic metastatic disease. Left hip replacement. Left gluteal muscular atrophy.  IMPRESSION: 1. Small umbilical hernia with herniation of fat only. No bowel herniation. This most likely accounts for palpable abnormality in the region of the umbilicus. No significant adjacent inflammation. 2. Prominent lymph nodes are noted in the left inguinal region, the largest measures 1.7 cm. Although these may be reactive, malignant adenopathy cannot be excluded 3. Diffuse fatty infiltration of the liver. 4. Sigmoid colonic diverticulosis, no diverticulitis. 5. Left hip replacement. Left gluteal muscular atrophy.   Electronically Signed By: Marcello Moores Register On: 02/07/2014 11:47      Other Problems  High blood pressure  Past Surgical History  Hip Surgery Left. Knee Surgery Right.  Diagnostic Studies History  Colonoscopy 5-10 years ago  Allergies  No Known Drug Allergies02/11/2014  Medication History  Cialis (5MG  Tablet, Oral) Active. Vitamin D3 (1000UNIT Capsule, Oral) Active. Lisinopril-Hydrochlorothiazide (20-12.5MG  Tablet, Oral) Active. Allegra Allergy (60MG  Tablet, Oral) Active. Levocetirizine Dihydrochloride (5MG  Tablet, Oral) Active. Crestor (10MG  Tablet, Oral) Active.  Social History  Tobacco use Former smoker.  Family History  Arthritis Daughter.  Review of Systems  General Not Present- Appetite Loss, Chills, Fatigue, Fever, Night Sweats, Weight Gain and Weight Loss. HEENT Not Present- Earache, Hearing Loss, Hoarseness, Nose Bleed, Oral Ulcers, Ringing in the Ears, Seasonal Allergies, Sinus Pain, Sore Throat, Visual Disturbances, Wears glasses/contact lenses and  Yellow Eyes. Respiratory Not Present- Bloody sputum, Chronic Cough, Difficulty Breathing, Snoring and Wheezing. Breast Not Present- Breast Mass, Breast Pain, Nipple Discharge and  Skin Changes. Cardiovascular Not Present- Chest Pain, Difficulty Breathing Lying Down, Leg Cramps, Palpitations, Rapid Heart Rate, Shortness of Breath and Swelling of Extremities. Gastrointestinal Not Present- Abdominal Pain, Bloating, Bloody Stool, Change in Bowel Habits, Chronic diarrhea, Constipation, Difficulty Swallowing, Excessive gas, Gets full quickly at meals, Hemorrhoids, Indigestion, Nausea, Rectal Pain and Vomiting. Musculoskeletal Present- Joint Pain. Not Present- Back Pain, Joint Stiffness, Muscle Pain, Muscle Weakness and Swelling of Extremities. Neurological Not Present- Decreased Memory, Fainting, Headaches, Numbness, Seizures, Tingling, Tremor, Trouble walking and Weakness. Psychiatric Not Present- Anxiety, Bipolar, Change in Sleep Pattern, Depression, Fearful and Frequent crying. Endocrine Not Present- Cold Intolerance, Excessive Hunger, Hair Changes, Heat Intolerance, Hot flashes and New Diabetes. Hematology Not Present- Easy Bruising, Excessive bleeding, Gland problems, HIV and Persistent Infections.   Vitals  08/15/2014 9:22 AM Weight: 246.25 lb Height: 72in Body Surface Area: 2.38 m Body Mass Index: 33.4 kg/m Temp.: 98.62F(Oral)  Pulse: 74 (Regular)  BP: 120/78 (Sitting, Right Arm, Standard)    Physical Exam  The physical exam findings are as follows: Note:WDWN in NAD HEENT: EOMI, sclera anicteric Neck: No masses, no thyromegaly Lungs: CTA bilaterally; normal respiratory effort CV: Regular rate and rhythm; no murmurs Abd: +bowel sounds, soft, non-tender, protruding hernia at right upper side of umbilicus - partially reducible Ext: Well-perfused; no edema Skin: Warm, dry; no sign of jaundice    Assessment & Plan UMBILICAL HERNIA WITHOUT OBSTRUCTION AND WITHOUT GANGRENE  (553.1  K42.9) Current Plans  Schedule for Surgery -Umbilical hernia repair with mesh. The surgical procedure has been discussed with the patient. Potential risks, benefits, alternative treatments, and expected outcomes have been explained. All of the patient's questions at this time have been answered. The likelihood of reaching the patient's treatment goal is good. The patient understand the proposed surgical procedure and wishes to proceed.   Imogene Burn. Georgette Dover, MD, [facility]  General/ Trauma Surgery  09/25/2014 7:22 AM

## 2014-09-25 NOTE — Anesthesia Postprocedure Evaluation (Signed)
  Anesthesia Post-op Note  Patient: Andres [name]  Procedure(s) Performed: Procedure(s): UMBILICAL HERNIA REPAIR WITH MESH (N/A) INSERTION OF MESH (N/A)  Patient Location: PACU  Anesthesia Type:General  Level of Consciousness: awake and alert   Airway and Oxygen Therapy: Patient Spontanous Breathing  Post-op Pain: mild  Post-op Assessment: Post-op Vital signs reviewed  Post-op Vital Signs: Reviewed  Last Vitals:  Filed Vitals:   09/25/14 0854  BP:   Pulse: 65  Temp:   Resp: 18    Complications: No apparent anesthesia complications

## 2014-09-25 NOTE — Discharge Instructions (Signed)
[facility] ° °UMBILICAL HERNIA REPAIR: POST OP INSTRUCTIONS ° °Always review your discharge instruction sheet given to you by the facility where your surgery was performed. °IF YOU HAVE DISABILITY OR FAMILY LEAVE FORMS, YOU MUST BRING THEM TO THE OFFICE FOR PROCESSING.   °DO NOT GIVE THEM TO YOUR DOCTOR. ° °1. A  prescription for pain medication may be given to you upon discharge.  Take your pain medication as prescribed, if needed.  If narcotic pain medicine is not needed, then you may take acetaminophen (Tylenol) or ibuprofen (Advil) as needed. °2. Take your usually prescribed medications unless otherwise directed. °3. If you need a refill on your pain medication, please contact your pharmacy.  They will contact our office to request authorization. Prescriptions will not be filled after 5 pm or on week-ends. °4. You should follow a light diet the first 24 hours after arrival home, such as soup and crackers, etc.  Be sure to include lots of fluids daily.  Resume your normal diet the day after surgery. °5. Most patients will experience some swelling and bruising around the umbilicus or in the groin and scrotum.  Ice packs and reclining will help.  Swelling and bruising can take several days to resolve.  °6. It is common to experience some constipation if taking pain medication after surgery.  Increasing fluid intake and taking a stool softener (such as Colace) will usually help or prevent this problem from occurring.  A mild laxative (Milk of Magnesia or Miralax) should be taken according to package directions if there are no bowel movements after 48 hours. °7. Unless discharge instructions indicate otherwise, you may remove your bandages 24-48 hours after surgery, and you may shower at that time.  You will have steri-strips (small skin tapes) in place directly over the incision.  These strips should be left on the skin for 7-10 days. °8. ACTIVITIES:  You may resume regular (light) daily activities  beginning the next day--such as daily self-care, walking, climbing stairs--gradually increasing activities as tolerated.  You may have sexual intercourse when it is comfortable.  Refrain from any heavy lifting or straining until approved by your doctor. °a. You may drive when you are no longer taking prescription pain medication, you can comfortably wear a seatbelt, and you can safely maneuver your car and apply brakes. °b. RETURN TO WORK:  2-3 weeks with light duty - no lifting over 15 lbs. °9. You should see your doctor in the office for a follow-up appointment approximately 2-3 weeks after your surgery.  Make sure that you call for this appointment within a day or two after you arrive home to insure a convenient appointment time. °10. OTHER INSTRUCTIONS:  __________________________________________________________________________________________________________________________________________________________________________________________  °WHEN TO CALL YOUR DOCTOR: °1. Fever over 101.0 °2. Inability to urinate °3. Nausea and/or vomiting °4. Extreme swelling or bruising °5. Continued bleeding from incision. °6. Increased pain, redness, or drainage from the incision ° °The clinic staff is available to answer your questions during regular business hours.  Please don’t hesitate to call and ask to speak to one of the nurses for clinical concerns.  If you have a medical emergency, go to the nearest emergency room or call 911.  A surgeon from [facility] is always on call at the hospital ° ° °[address], [address], [place], [place]  [address] ? ° [address] [address], [place], [place]   [address] °[phone]    [phone]    FAX [phone] °Web site: [email or web address] ° ° °  Post Anesthesia Home Care Instructions ° °Activity: °Get plenty of rest for the remainder of the day. A responsible adult should stay with you for 24 hours following the procedure.  °For the next 24 hours, DO  NOT: °-Drive a car °-Operate machinery °-Drink alcoholic beverages °-Take any medication unless instructed by your physician °-Make any legal decisions or sign important papers. ° °Meals: °Start with liquid foods such as gelatin or soup. Progress to regular foods as tolerated. Avoid greasy, spicy, heavy foods. If nausea and/or vomiting occur, drink only clear liquids until the nausea and/or vomiting subsides. Call your physician if vomiting continues. ° °Special Instructions/Symptoms: °Your throat may feel dry or sore from the anesthesia or the breathing tube placed in your throat during surgery. If this causes discomfort, gargle with warm salt water. The discomfort should disappear within 24 hours. ° °

## 2014-09-25 NOTE — Op Note (Signed)
Indications:  The patient presented with a history of an enlarging umbilical hernia.  The patient was examined and we recommended umbilical hernia repair with mesh.  Pre-operative diagnosis:  Umbilical hernia  Post-operative diagnosis:  Same  Surgeon: Curties Conigliaro K.   Assistants: none  Anesthesia: General LMA anesthesia  ASA Class: 2   Procedure Details  The patient was seen again in the Holding Room. The risks, benefits, complications, treatment options, and expected outcomes were discussed with the patient. The possibilities of reaction to medication, pulmonary aspiration, perforation of viscus, bleeding, recurrent infection, the need for additional procedures, and development of a complication requiring transfusion or further operation were discussed with the patient and/or family. There was concurrence with the proposed plan, and informed consent was obtained. The site of surgery was properly noted/marked. The patient was taken to the Operating Room, identified as Andres [name], and the procedure verified as umbilical hernia repair. A Time Out was held and the above information confirmed.  After an adequate level of general anesthesia was obtained, the patient's abdomen was prepped with Chloraprep and draped in sterile fashion.  We made a transverse incision above the umbilicus.  Dissection was carried down to the hernia sac with cautery.  We dissected bluntly around the hernia sac down to the edge of the fascial defect.  We reduced the hernia sac back into the pre-peritoneal space.  The fascial defect measured 1 cm.  We cleared the fascia in all directions.  A small Ventralex mesh was inserted into the pre-peritoneal space and was deployed.  The mesh was secured with four trans-fascial sutures of 0 Novofil.  The fascial defect was closed with two interrupted figure-of-eight 1 Novofil sutures.  The base of the umbilicus was tacked down with 3-0 Vicryl.  3-0 Vicryl was used to close the  subcutaneous tissues and 4-0 Monocryl was used to close the skin.  Steri-strips and clean dressing were applied.  The patient was extubated and brought to the recovery room in stable condition.  All sponge, instrument, and needle counts were correct prior to closure and at the conclusion of the case.   Estimated Blood Loss: Minimal          Complications: None; patient tolerated the procedure well.         Disposition: PACU - hemodynamically stable.         Condition: stable   Imogene Burn. Georgette Dover, MD, [facility]  General/ Trauma Surgery  09/25/2014 8:13 AM

## 2014-09-25 NOTE — Anesthesia Preprocedure Evaluation (Addendum)
Anesthesia Evaluation  Patient identified by MRN, date of birth, ID band Patient awake    Reviewed: Allergy & Precautions, NPO status , Patient's Chart, lab work & pertinent test results  Airway Mallampati: III  TM Distance: >3 FB Neck ROM: Full    Dental  (+) Teeth Intact, Dental Advisory Given   Pulmonary former smoker,  breath sounds clear to auscultation        Cardiovascular hypertension, Pt. on medications Rhythm:Regular Rate:Normal     Neuro/Psych negative neurological ROS     GI/Hepatic negative GI ROS, Neg liver ROS,   Endo/Other  negative endocrine ROS  Renal/GU negative Renal ROS     Musculoskeletal   Abdominal   Peds  Hematology negative hematology ROS (+)   Anesthesia Other Findings   Reproductive/Obstetrics                            Anesthesia Physical Anesthesia Plan  ASA: II  Anesthesia Plan: General   Post-op Pain Management:    Induction: Intravenous  Airway Management Planned: LMA and Oral ETT  Additional Equipment:   Intra-op Plan:   Post-operative Plan: Extubation in OR  Informed Consent: I have reviewed the patients History and Physical, chart, labs and discussed the procedure including the risks, benefits and alternatives for the proposed anesthesia with the patient or authorized representative who has indicated his/her understanding and acceptance.   Dental advisory given  Plan Discussed with: CRNA  Anesthesia Plan Comments:         Anesthesia Quick Evaluation

## 2014-09-25 NOTE — Transfer of Care (Signed)
Immediate Anesthesia Transfer of Care Note  Patient: Andres [name]  Procedure(s) Performed: Procedure(s): UMBILICAL HERNIA REPAIR WITH MESH (N/A) INSERTION OF MESH (N/A)  Patient Location: PACU  Anesthesia Type:General  Level of Consciousness: awake, alert  and oriented  Airway & Oxygen Therapy: Patient Spontanous Breathing and Patient connected to face mask oxygen  Post-op Assessment: Report given to RN, Post -op Vital signs reviewed and stable and Patient moving all extremities  Post vital signs: Reviewed and stable  Last Vitals:  Filed Vitals:   09/25/14 0634  BP: 115/73  Pulse: 53  Temp: 36.6 C  Resp: 16    Complications: No apparent anesthesia complications

## 2014-09-26 ENCOUNTER — Encounter (HOSPITAL_BASED_OUTPATIENT_CLINIC_OR_DEPARTMENT_OTHER): Payer: Self-pay | Admitting: Surgery

## 2015-04-07 ENCOUNTER — Encounter: Payer: Self-pay | Admitting: Internal Medicine

## 2016-04-06 LAB — HM COLONOSCOPY

## 2017-10-18 ENCOUNTER — Other Ambulatory Visit: Payer: Self-pay | Admitting: Internal Medicine

## 2017-10-18 ENCOUNTER — Ambulatory Visit
Admission: RE | Admit: 2017-10-18 | Discharge: 2017-10-18 | Disposition: A | Discharge status: Home / Self Care | Payer: Managed Care, Other (non HMO) | Source: Ambulatory Visit | Attending: Internal Medicine | Admitting: Internal Medicine

## 2017-10-18 DIAGNOSIS — M542 Cervicalgia: Secondary | ICD-10-CM

## 2017-10-26 ENCOUNTER — Other Ambulatory Visit: Payer: Self-pay | Admitting: Internal Medicine

## 2017-10-26 DIAGNOSIS — M1990 Unspecified osteoarthritis, unspecified site: Secondary | ICD-10-CM

## 2017-11-01 ENCOUNTER — Other Ambulatory Visit: Payer: No Typology Code available for payment source

## 2017-11-21 LAB — HM DIABETES EYE EXAM

## 2018-01-15 DIAGNOSIS — R351 Nocturia: Secondary | ICD-10-CM | POA: Diagnosis not present

## 2018-01-15 DIAGNOSIS — N401 Enlarged prostate with lower urinary tract symptoms: Secondary | ICD-10-CM | POA: Diagnosis not present

## 2018-01-15 DIAGNOSIS — N400 Enlarged prostate without lower urinary tract symptoms: Secondary | ICD-10-CM | POA: Diagnosis not present

## 2018-01-25 ENCOUNTER — Telehealth: Payer: Self-pay | Admitting: Cardiology

## 2018-01-25 ENCOUNTER — Ambulatory Visit: Payer: No Typology Code available for payment source | Admitting: Cardiology

## 2018-01-25 NOTE — Telephone Encounter (Signed)
Called pt to r/s apt due to hospital census being high per Dr. Harl Bowie-- pt returned call and was upset that he was having to reschedule apt since he'd already rescheduled an eye Dr. apt so he could make this apt today, I informed Andres [name] that we would just leave him on the schedule for today and to please come on in. Andres [name] stated that he would just cancel the apt and speak with his Dr to find somewhere else to go.

## 2018-02-13 DIAGNOSIS — I129 Hypertensive chronic kidney disease with stage 1 through stage 4 chronic kidney disease, or unspecified chronic kidney disease: Secondary | ICD-10-CM | POA: Insufficient documentation

## 2018-02-13 DIAGNOSIS — E1122 Type 2 diabetes mellitus with diabetic chronic kidney disease: Secondary | ICD-10-CM | POA: Diagnosis not present

## 2018-02-13 DIAGNOSIS — Z794 Long term (current) use of insulin: Secondary | ICD-10-CM | POA: Diagnosis not present

## 2018-02-13 DIAGNOSIS — N182 Chronic kidney disease, stage 2 (mild): Secondary | ICD-10-CM | POA: Diagnosis not present

## 2018-02-13 DIAGNOSIS — N08 Glomerular disorders in diseases classified elsewhere: Secondary | ICD-10-CM | POA: Insufficient documentation

## 2018-02-13 LAB — HEMOGLOBIN A1C: Hemoglobin A1C: 7.7

## 2018-03-13 DIAGNOSIS — Z6831 Body mass index (BMI) 31.0-31.9, adult: Secondary | ICD-10-CM | POA: Diagnosis not present

## 2018-03-13 DIAGNOSIS — M542 Cervicalgia: Secondary | ICD-10-CM | POA: Diagnosis not present

## 2018-03-31 ENCOUNTER — Encounter: Payer: Self-pay | Admitting: Internal Medicine

## 2018-03-31 DIAGNOSIS — N08 Glomerular disorders in diseases classified elsewhere: Secondary | ICD-10-CM

## 2018-04-11 ENCOUNTER — Encounter: Payer: Self-pay | Admitting: Internal Medicine

## 2018-04-11 ENCOUNTER — Ambulatory Visit: Payer: Self-pay | Admitting: Internal Medicine

## 2018-04-11 ENCOUNTER — Ambulatory Visit (INDEPENDENT_AMBULATORY_CARE_PROVIDER_SITE_OTHER): Payer: Medicare HMO | Admitting: Internal Medicine

## 2018-04-11 VITALS — BP 122/78 | HR 62 | Temp 97.6°F | Ht 72.0 in | Wt 221.2 lb

## 2018-04-11 DIAGNOSIS — N182 Chronic kidney disease, stage 2 (mild): Secondary | ICD-10-CM | POA: Diagnosis not present

## 2018-04-11 DIAGNOSIS — Z79899 Other long term (current) drug therapy: Secondary | ICD-10-CM | POA: Diagnosis not present

## 2018-04-11 DIAGNOSIS — Z794 Long term (current) use of insulin: Secondary | ICD-10-CM | POA: Diagnosis not present

## 2018-04-11 DIAGNOSIS — E1122 Type 2 diabetes mellitus with diabetic chronic kidney disease: Secondary | ICD-10-CM | POA: Diagnosis not present

## 2018-04-11 DIAGNOSIS — Z5181 Encounter for therapeutic drug level monitoring: Secondary | ICD-10-CM

## 2018-04-11 DIAGNOSIS — I129 Hypertensive chronic kidney disease with stage 1 through stage 4 chronic kidney disease, or unspecified chronic kidney disease: Secondary | ICD-10-CM

## 2018-04-11 DIAGNOSIS — N186 End stage renal disease: Secondary | ICD-10-CM | POA: Diagnosis not present

## 2018-04-11 DIAGNOSIS — Z23 Encounter for immunization: Secondary | ICD-10-CM

## 2018-04-11 LAB — CMP14+EGFR
ALT: 19 IU/L (ref 0–44)
AST: 22 IU/L (ref 0–40)
Albumin/Globulin Ratio: 1.4 (ref 1.2–2.2)
Albumin: 4.4 g/dL (ref 3.6–4.8)
Alkaline Phosphatase: 62 IU/L (ref 39–117)
BUN/Creatinine Ratio: 11 (ref 10–24)
BUN: 12 mg/dL (ref 8–27)
Bilirubin Total: 0.4 mg/dL (ref 0.0–1.2)
CO2: 24 mmol/L (ref 20–29)
Calcium: 9.5 mg/dL (ref 8.6–10.2)
Chloride: 97 mmol/L (ref 96–106)
Creatinine, Ser: 1.13 mg/dL (ref 0.76–1.27)
GFR calc Af Amer: 78 mL/min/{1.73_m2} (ref >59)
GFR calc non Af Amer: 68 mL/min/{1.73_m2} (ref >59)
Globulin, Total: 3.2 g/dL (ref 1.5–4.5)
Glucose: 112 mg/dL — ABNORMAL HIGH (ref 65–99)
Potassium: 4.6 mmol/L (ref 3.5–5.2)
Sodium: 138 mmol/L (ref 134–144)
Total Protein: 7.6 g/dL (ref 6.0–8.5)

## 2018-04-11 LAB — HEMOGLOBIN A1C
Est. average glucose Bld gHb Est-mCnc: 131 mg/dL
Hgb A1c MFr Bld: 6.2 % — ABNORMAL HIGH (ref 4.8–5.6)

## 2018-04-11 NOTE — Progress Notes (Addendum)
  Subjective:     Patient ID: Andres [name], male   DOB: 1950-10-03, 67 y.o.   MRN: [id number]  Diabetes  He presents for his follow-up diabetic visit. He has type 2 diabetes mellitus. His disease course has been improving. There are no hypoglycemic associated symptoms. There are no diabetic associated symptoms. Pertinent negatives for diabetes include no blurred vision and no chest pain. Diabetic complications include nephropathy. Risk factors for coronary artery disease include diabetes mellitus, dyslipidemia, hypertension and male sex. He is compliant with treatment all of the time. He participates in exercise three times a week. His breakfast blood glucose is taken between 8-9 am. His breakfast blood glucose range is generally 110-130 mg/dl. Eye exam is current.  Hypertension  This is a chronic problem. The current episode started more than 1 year ago. The problem is unchanged. The problem is controlled. Pertinent negatives include no blurred vision, chest pain or shortness of breath. Hypertensive end-organ damage includes kidney disease.  Reports compliance with meds.   Review of Systems  Constitutional: Negative.   HENT: Negative.   Eyes: Negative.  Negative for blurred vision.  Respiratory: Negative.  Negative for shortness of breath.   Cardiovascular: Negative.  Negative for chest pain.  Genitourinary: Negative.   Psychiatric/Behavioral: Negative.        Vitals:   04/11/18 1006  Weight: 221 lb 3.2 oz (100.3 kg)  Height: 6' (1.829 m)   Vitals:   04/11/18 1006  BP: 122/78  Pulse: 62  Temp: 97.6 F (36.4 C)   Body mass index is 30 kg/m. Objective:   Physical Exam  Constitutional: He appears well-developed and well-nourished.  HENT:  Head: Normocephalic and atraumatic.  Eyes: Pupils are equal, round, and reactive to light. Conjunctivae and EOM are normal.  Neck: Normal range of motion. Neck supple.  Cardiovascular: Normal rate, regular rhythm and normal heart sounds.   Skin: Skin is warm.  Psychiatric: He has a normal mood and affect.       Assessment:    Type 2 diabetes mellitus with end-stage renal disease ([place]) - HE WAS CONGRATULATED ON HIS LIFESTYLE CHANGES AND ENCOURAGED TO KEEP UP HIS EFFORTS. HE WILL CONTINUE WITH CURRENT MEDS FOR NOW. - Plan: Hemoglobin A1c, CMP14+EGFR  Chronic kidney disease, stage II (mild) - CHRONIC, I WILL CHECK A GFR, CR TODAY. HE IS ENCOURAGED TO STAY WELL HYDRATED.   Benign hypertensive renal disease - WELL CONTROLLED.  HE WILL CONTINUE WITH HIS CURRENT MEDS. HE IS ENCOURAGED TO LIMIT HIS SALT INTAKE.   Need for prophylactic vaccination and inoculation against influenza - HE WAS GIVEN HIGH DOSE FLU VACCINE.   Encounter for monitoring antihypertensive use - Plan: CMP14+EGFR     PLAN:  HE INQUIRED ABOUT LENGTH OF TIME HE WOULD NEED TO BE ON INSULIN. HE STATES HIS CHILDREN ARE CONCERNED THAT IT MAY CAUSE HIM HARM. PT REMINDED OF PRESENCE OF NEPHROPATHY, SO HE IS ALREADY EXPERIENCING COMPLICATIONS OF NEPHROPATHY.  I WILL CHECK AN A1C TODAY AND MAKE FURTHER RECOMMENDATIONS AFTER REVIEWING HIS LABS. I WILL CONSIDER USE OF GLP-1 THERAPY IN THE FUTURE. HE IS ACCEPTING OF THIS INFORMATION.     Addendum: Correct CKD code is E11.22 w/ Stage 2 CKD.

## 2018-04-15 NOTE — Progress Notes (Signed)
Your hb1c is 6.2, this is great! I am so proud of you! Keep up the great work. Katawbba - pls update his meds, I did not see his insulin listed (it could be me). Be sure to continue with his new lifestyle. I want him to decrease his insulin by 2 units (pls enter new direction in chart)/

## 2018-04-16 ENCOUNTER — Other Ambulatory Visit: Payer: Self-pay

## 2018-06-18 ENCOUNTER — Other Ambulatory Visit: Payer: Self-pay | Admitting: Internal Medicine

## 2018-07-13 ENCOUNTER — Other Ambulatory Visit: Payer: Self-pay | Admitting: Internal Medicine

## 2018-07-23 ENCOUNTER — Encounter: Payer: Self-pay | Admitting: Internal Medicine

## 2018-07-23 ENCOUNTER — Ambulatory Visit (INDEPENDENT_AMBULATORY_CARE_PROVIDER_SITE_OTHER): Payer: Medicare HMO | Admitting: Internal Medicine

## 2018-07-23 VITALS — BP 118/74 | HR 99 | Temp 97.9°F | Ht 69.5 in | Wt 226.4 lb

## 2018-07-23 DIAGNOSIS — Z794 Long term (current) use of insulin: Secondary | ICD-10-CM | POA: Diagnosis not present

## 2018-07-23 DIAGNOSIS — I129 Hypertensive chronic kidney disease with stage 1 through stage 4 chronic kidney disease, or unspecified chronic kidney disease: Secondary | ICD-10-CM | POA: Diagnosis not present

## 2018-07-23 DIAGNOSIS — Z23 Encounter for immunization: Secondary | ICD-10-CM | POA: Diagnosis not present

## 2018-07-23 DIAGNOSIS — E6609 Other obesity due to excess calories: Secondary | ICD-10-CM

## 2018-07-23 DIAGNOSIS — E1122 Type 2 diabetes mellitus with diabetic chronic kidney disease: Secondary | ICD-10-CM

## 2018-07-23 DIAGNOSIS — E7800 Pure hypercholesterolemia, unspecified: Secondary | ICD-10-CM | POA: Diagnosis not present

## 2018-07-23 DIAGNOSIS — N182 Chronic kidney disease, stage 2 (mild): Secondary | ICD-10-CM

## 2018-07-23 DIAGNOSIS — Z79899 Other long term (current) drug therapy: Secondary | ICD-10-CM

## 2018-07-23 DIAGNOSIS — Z6832 Body mass index (BMI) 32.0-32.9, adult: Secondary | ICD-10-CM | POA: Diagnosis not present

## 2018-07-23 DIAGNOSIS — I10 Essential (primary) hypertension: Secondary | ICD-10-CM | POA: Insufficient documentation

## 2018-07-23 MED ORDER — TETANUS-DIPHTH-ACELL PERTUSSIS 5-2.5-18.5 LF-MCG/0.5 IM SUSP: 0.5000 mL | Freq: Once | INTRAMUSCULAR | 0 refills | Status: AC

## 2018-07-23 MED ORDER — SITAGLIPTIN PHOS-METFORMIN HCL 50-1000 MG PO TABS: 1.0000 | ORAL_TABLET | Freq: Two times a day (BID) | ORAL | 5 refills | Status: DC

## 2018-07-23 NOTE — Patient Instructions (Signed)
Diabetes Mellitus and Exercise Exercising regularly is important for your overall health, especially when you have diabetes (diabetes mellitus). Exercising is not only about losing weight. It has many other health benefits, such as increasing muscle strength and bone density and reducing body fat and stress. This leads to improved fitness, flexibility, and endurance, all of which result in better overall health. Exercise has additional benefits for people with diabetes, including:  Reducing appetite.  Helping to lower and control blood glucose.  Lowering blood pressure.  Helping to control amounts of fatty substances (lipids) in the blood, such as cholesterol and triglycerides.  Helping the body to respond better to insulin (improving insulin sensitivity).  Reducing how much insulin the body needs.  Decreasing the risk for heart disease by: ? Lowering cholesterol and triglyceride levels. ? Increasing the levels of good cholesterol. ? Lowering blood glucose levels. What is my activity plan? Your health care provider or certified diabetes educator can help you make a plan for the type and frequency of exercise (activity plan) that works for you. Make sure that you:  Do at least 150 minutes of moderate-intensity or vigorous-intensity exercise each week. This could be brisk walking, biking, or water aerobics. ? Do stretching and strength exercises, such as yoga or weightlifting, at least 2 times a week. ? Spread out your activity over at least 3 days of the week.  Get some form of physical activity every day. ? Do not go more than 2 days in a row without some kind of physical activity. ? Avoid being inactive for more than 30 minutes at a time. Take frequent breaks to walk or stretch.  Choose a type of exercise or activity that you enjoy, and set realistic goals.  Start slowly, and gradually increase the intensity of your exercise over time. What do I need to know about managing my  diabetes?   Check your blood glucose before and after exercising. ? If your blood glucose is 240 mg/dL (13.3 mmol/L) or higher before you exercise, check your urine for ketones. If you have ketones in your urine, do not exercise until your blood glucose returns to normal. ? If your blood glucose is 100 mg/dL (5.6 mmol/L) or lower, eat a snack containing 15-20 grams of carbohydrate. Check your blood glucose 15 minutes after the snack to make sure that your level is above 100 mg/dL (5.6 mmol/L) before you start your exercise.  Know the symptoms of low blood glucose (hypoglycemia) and how to treat it. Your risk for hypoglycemia increases during and after exercise. Common symptoms of hypoglycemia can include: ? Hunger. ? Anxiety. ? Sweating and feeling clammy. ? Confusion. ? Dizziness or feeling light-headed. ? Increased heart rate or palpitations. ? Blurry vision. ? Tingling or numbness around the mouth, lips, or tongue. ? Tremors or shakes. ? Irritability.  Keep a rapid-acting carbohydrate snack available before, during, and after exercise to help prevent or treat hypoglycemia.  Avoid injecting insulin into areas of the body that are going to be exercised. For example, avoid injecting insulin into: ? The arms, when playing tennis. ? The legs, when jogging.  Keep records of your exercise habits. Doing this can help you and your health care provider adjust your diabetes management plan as needed. Write down: ? Food that you eat before and after you exercise. ? Blood glucose levels before and after you exercise. ? The type and amount of exercise you have done. ? When your insulin is expected to peak, if you use   insulin. Avoid exercising at times when your insulin is peaking.  When you start a new exercise or activity, work with your health care provider to make sure the activity is safe for you, and to adjust your insulin, medicines, or food intake as needed.  Drink plenty of water while  you exercise to prevent dehydration or heat stroke. Drink enough fluid to keep your urine clear or pale yellow. Summary  Exercising regularly is important for your overall health, especially when you have diabetes (diabetes mellitus).  Exercising has many health benefits, such as increasing muscle strength and bone density and reducing body fat and stress.  Your health care provider or certified diabetes educator can help you make a plan for the type and frequency of exercise (activity plan) that works for you.  When you start a new exercise or activity, work with your health care provider to make sure the activity is safe for you, and to adjust your insulin, medicines, or food intake as needed. This information is not intended to replace advice given to you by your health care provider. Make sure you discuss any questions you have with your health care provider. Document Released: 09/17/2003 Document Revised: 01/05/2017 Document Reviewed: 12/07/2015 Elsevier Interactive Patient Education  2019 Elsevier Inc.  

## 2018-07-23 NOTE — Progress Notes (Signed)
Subjective:     Patient ID: Andres [name] , male    DOB: 1950/08/19 , 68 y.o.   MRN: [id number]   Chief Complaint  Patient presents with  . Diabetes  . Hypertension    HPI  Diabetes  He presents for his follow-up diabetic visit. He has type 2 diabetes mellitus. His disease course has been stable. There are no hypoglycemic associated symptoms. Pertinent negatives for diabetes include no blurred vision and no chest pain. There are no hypoglycemic complications. Diabetic complications include nephropathy. Risk factors for coronary artery disease include diabetes mellitus, dyslipidemia, hypertension, male sex, obesity and sedentary lifestyle.  Hypertension  This is a chronic problem. The current episode started more than 1 year ago. The problem has been gradually improving since onset. The problem is controlled. Pertinent negatives include no blurred vision, chest pain, palpitations or shortness of breath.     Past Medical History:  Diagnosis Date  . Diabetes ([place])   . High cholesterol   . Hypertension    states under control with med., has been on med. x 5 yr.  . Seasonal allergies   . Umbilical hernia [id number]/8464     Family History  Problem Relation Age of Onset  . Healthy Mother   . Early death Father      Current Outpatient Medications:  .  ACCU-CHEK FASTCLIX LANCETS MISC, USE TO CHECK BLOOD SUGAR TID BEFORE MEALS, Disp: , Rfl: 11 .  ACCU-CHEK GUIDE test strip, USE TO CHECK BLOOD SUGAR TID BEFORE MEALS, Disp: , Rfl: 11 .  Arginine 500 MG CAPS, Take by mouth., Disp: , Rfl:  .  cholecalciferol (VITAMIN D) 1000 UNITS tablet, Take 1,000 Units by mouth daily., Disp: , Rfl:  .  Cyanocobalamin (VITAMIN B 12 PO), Take 1 capsule by mouth., Disp: , Rfl:  .  fluticasone (CUTIVATE) 0.05 % cream, , Disp: , Rfl:  .  Insulin Glargine (TOUJEO MAX SOLOSTAR) 300 UNIT/ML SOPN, Inject 3 Units into the skin at bedtime. , Disp: , Rfl:  .  levocetirizine (XYZAL) 5 MG tablet, Take 5 mg by mouth  every evening., Disp: , Rfl:  .  Multiple Vitamin (MULTIVITAMIN) tablet, Take 1 tablet by mouth daily., Disp: , Rfl:  .  olmesartan-hydrochlorothiazide (BENICAR HCT) 20-12.5 MG tablet, TAKE 1 TABLET BY MOUTH ONCE DAILY., Disp: 90 tablet, Rfl: 1 .  rosuvastatin (CRESTOR) 10 MG tablet, Take 10 mg by mouth daily., Disp: , Rfl:  .  sitaGLIPtin-metformin ([name]) 50-1000 MG tablet, Take 1 tablet by mouth 2 (two) times daily with a meal., Disp: 60 tablet, Rfl: 5 .  SURE COMFORT PEN NEEDLES 31G X 8 MM MISC, USE AS DIRECTED WITH INSULIN PEN., Disp: 100 each, Rfl: 0 .  tadalafil (CIALIS) 5 MG tablet, TAKE 1 TABLET BY MOUTH ONCE A DAY., Disp: 30 tablet, Rfl: 0 .  aspirin 81 MG tablet, Take 81 mg by mouth daily., Disp: , Rfl:  .  Tdap (BOOSTRIX) 5-2.5-18.5 LF-MCG/0.5 injection, Inject 0.5 mLs into the muscle once for 1 dose., Disp: 0.5 mL, Rfl: 0   No Known Allergies   Review of Systems  Constitutional: Negative.   Eyes: Negative for blurred vision.  Respiratory: Negative.  Negative for shortness of breath.   Cardiovascular: Negative.  Negative for chest pain and palpitations.  Gastrointestinal: Negative.   Neurological: Negative.   Psychiatric/Behavioral: Negative.      Today's Vitals   07/23/18 1010  BP: 118/74  Pulse: 99  Temp: 97.9 F (36.6 C)  TempSrc: Oral  Weight: 226 lb 6.4 oz (102.7 kg)  Height: 5' 9.5" (1.765 m)  PainSc: 0-No pain   Body mass index is 32.95 kg/m.   Objective:  Physical Exam Vitals signs and nursing note reviewed.  Constitutional:      Appearance: Normal appearance. He is obese.  HENT:     Head: Normocephalic and atraumatic.  Neck:     Musculoskeletal: Normal range of motion.  Cardiovascular:     Rate and Rhythm: Normal rate and regular rhythm.     Heart sounds: Normal heart sounds.  Pulmonary:     Effort: Pulmonary effort is normal.     Breath sounds: Normal breath sounds.  Skin:    General: Skin is warm.  Neurological:     General: No focal  deficit present.     Mental Status: He is alert.         Assessment And Plan:     1. Type 2 diabetes mellitus with stage 2 chronic kidney disease, with long-term current use of insulin ([place])  He was congratulated on the lifestyle changes he has made thus far. I will check labs as listed below. He will rto in 3 months for re-evaluation. He is hoping that he can be weaned off of insulin. I will make further recommendations once his labs are available for review.   - CMP14+EGFR - Hemoglobin A1c - Testosterone, Total - Lipid Profile  2. Hypertensive nephropathy  Well controlled. He will continue with current meds. He is encouraged to avoid adding salt to his foods.   3. Pure hypercholesterolemia  I will check a lipid panel and LFTs. He is encouraged to avoid fried foods, increase his fish intake and exercise 30 minutes five days weekly.   4. Class 1 obesity due to excess calories with serious comorbidity and body mass index (BMI) of 32.0 to 32.9 in adult  He is encouraged to strive for BMI less than 28 to decrease cardiac risk. He is encouraged to exercise 30 minutes five days weekly. He was congratulated on the dietary changes he has made thus far and encouraged to keep up the great work.   5. Need for vaccination  He was given pneumovax today.   6. Drug therapy   [name], MD

## 2018-07-24 LAB — CMP14+EGFR
ALT: 21 IU/L (ref 0–44)
AST: 21 IU/L (ref 0–40)
Albumin/Globulin Ratio: 1.4 (ref 1.2–2.2)
Albumin: 4.4 g/dL (ref 3.6–4.8)
Alkaline Phosphatase: 61 IU/L (ref 39–117)
BUN/Creatinine Ratio: 13 (ref 10–24)
BUN: 16 mg/dL (ref 8–27)
Bilirubin Total: 0.5 mg/dL (ref 0.0–1.2)
CO2: 22 mmol/L (ref 20–29)
Calcium: 9.5 mg/dL (ref 8.6–10.2)
Chloride: 95 mmol/L — ABNORMAL LOW (ref 96–106)
Creatinine, Ser: 1.22 mg/dL (ref 0.76–1.27)
GFR calc Af Amer: 71 mL/min/{1.73_m2} (ref >59)
GFR calc non Af Amer: 62 mL/min/{1.73_m2} (ref >59)
Globulin, Total: 3.2 g/dL (ref 1.5–4.5)
Glucose: 126 mg/dL — ABNORMAL HIGH (ref 65–99)
Potassium: 4.7 mmol/L (ref 3.5–5.2)
Sodium: 141 mmol/L (ref 134–144)
Total Protein: 7.6 g/dL (ref 6.0–8.5)

## 2018-07-24 LAB — LIPID PANEL
Chol/HDL Ratio: 3.6 ratio (ref 0.0–5.0)
Cholesterol, Total: 136 mg/dL (ref 100–199)
HDL: 38 mg/dL — ABNORMAL LOW (ref >39)
LDL Calculated: 79 mg/dL (ref 0–99)
Triglycerides: 95 mg/dL (ref 0–149)
VLDL Cholesterol Cal: 19 mg/dL (ref 5–40)

## 2018-07-24 LAB — TESTOSTERONE: Testosterone: 238 ng/dL — ABNORMAL LOW (ref 264–916)

## 2018-07-24 LAB — HEMOGLOBIN A1C
Est. average glucose Bld gHb Est-mCnc: 131 mg/dL
Hgb A1c MFr Bld: 6.2 % — ABNORMAL HIGH (ref 4.8–5.6)

## 2018-07-24 NOTE — Progress Notes (Signed)
Your kidney fxn is nl. Your liver fxn is nl. Your hba1c is 6.2, this is wonderful. We can stop the insulin. You have done great! I am so proud of you! You will need to continue with the janumet. I will see you in 3 months. Your sugars will go up some, this is expected. I need to know if it goes over 180. Your testosterone levels are low. We can check them again after you try the supplement you purchased. Your chol is okay, shows me you need to exercise more.

## 2018-07-26 ENCOUNTER — Other Ambulatory Visit: Payer: Self-pay | Admitting: Internal Medicine

## 2018-07-27 ENCOUNTER — Other Ambulatory Visit: Payer: Self-pay

## 2018-07-27 MED ORDER — SITAGLIPTIN PHOS-METFORMIN HCL 50-1000 MG PO TABS: 1.0000 | ORAL_TABLET | Freq: Two times a day (BID) | ORAL | 5 refills | Status: DC

## 2018-08-13 ENCOUNTER — Other Ambulatory Visit: Payer: Self-pay | Admitting: Internal Medicine

## 2018-09-17 ENCOUNTER — Other Ambulatory Visit: Payer: Self-pay

## 2018-09-17 DIAGNOSIS — N529 Male erectile dysfunction, unspecified: Secondary | ICD-10-CM

## 2018-09-17 MED ORDER — TADALAFIL 5 MG PO TABS: 5.0000 mg | ORAL_TABLET | Freq: Every day | ORAL | 1 refills | Status: DC

## 2018-10-22 ENCOUNTER — Ambulatory Visit: Payer: Medicare HMO | Admitting: Internal Medicine

## 2018-10-23 ENCOUNTER — Ambulatory Visit: Payer: Medicare HMO | Admitting: Internal Medicine

## 2018-10-30 ENCOUNTER — Encounter: Payer: Self-pay | Admitting: Internal Medicine

## 2018-10-30 ENCOUNTER — Other Ambulatory Visit: Payer: Self-pay

## 2018-10-30 ENCOUNTER — Other Ambulatory Visit: Payer: Self-pay | Admitting: Internal Medicine

## 2018-10-30 ENCOUNTER — Ambulatory Visit (INDEPENDENT_AMBULATORY_CARE_PROVIDER_SITE_OTHER): Payer: Medicare HMO | Admitting: Internal Medicine

## 2018-10-30 VITALS — BP 116/78 | HR 90 | Temp 98.1°F | Ht 70.8 in | Wt 224.8 lb

## 2018-10-30 DIAGNOSIS — E1122 Type 2 diabetes mellitus with diabetic chronic kidney disease: Secondary | ICD-10-CM | POA: Diagnosis not present

## 2018-10-30 DIAGNOSIS — Z794 Long term (current) use of insulin: Secondary | ICD-10-CM

## 2018-10-30 DIAGNOSIS — I129 Hypertensive chronic kidney disease with stage 1 through stage 4 chronic kidney disease, or unspecified chronic kidney disease: Secondary | ICD-10-CM

## 2018-10-30 DIAGNOSIS — Z0000 Encounter for general adult medical examination without abnormal findings: Secondary | ICD-10-CM

## 2018-10-30 DIAGNOSIS — Z6831 Body mass index (BMI) 31.0-31.9, adult: Secondary | ICD-10-CM | POA: Diagnosis not present

## 2018-10-30 DIAGNOSIS — E66811 Obesity, class 1: Secondary | ICD-10-CM

## 2018-10-30 DIAGNOSIS — E7800 Pure hypercholesterolemia, unspecified: Secondary | ICD-10-CM | POA: Diagnosis not present

## 2018-10-30 DIAGNOSIS — N182 Chronic kidney disease, stage 2 (mild): Secondary | ICD-10-CM

## 2018-10-30 DIAGNOSIS — E6609 Other obesity due to excess calories: Secondary | ICD-10-CM | POA: Diagnosis not present

## 2018-10-30 LAB — POCT URINALYSIS DIPSTICK
Bilirubin, UA: NEGATIVE
Blood, UA: NEGATIVE
Glucose, UA: NEGATIVE
Ketones, UA: NEGATIVE
Leukocytes, UA: NEGATIVE
Nitrite, UA: NEGATIVE
Protein, UA: NEGATIVE
Spec Grav, UA: 1.025 (ref 1.010–1.025)
Urobilinogen, UA: 0.2 E.U./dL
pH, UA: 5.5 (ref 5.0–8.0)

## 2018-10-30 LAB — POCT UA - MICROALBUMIN
Albumin/Creatinine Ratio, Urine, POC: 30
Creatinine, POC: 200 mg/dL
Microalbumin Ur, POC: 10 mg/L

## 2018-10-30 MED ORDER — FLUTICASONE PROPIONATE 0.05 % EX CREA: TOPICAL_CREAM | Freq: Two times a day (BID) | CUTANEOUS | 2 refills | Status: DC

## 2018-10-30 MED ORDER — SITAGLIPTIN PHOS-METFORMIN HCL 50-1000 MG PO TABS: 1.0000 | ORAL_TABLET | Freq: Two times a day (BID) | ORAL | 2 refills | Status: DC

## 2018-10-30 NOTE — Patient Instructions (Addendum)
  Mr. Andres [name] , Thank you for taking time to come for your Medicare Wellness Visit. I appreciate your ongoing commitment to your health goals. Please review the following plan we discussed and let me know if I can assist you in the future.   These are the goals we discussed: Goals    . Exercise 150 min/wk Moderate Activity       This is a list of the screening recommended for you and due dates:  Health Maintenance  Topic Date Due  . Tetanus Vaccine  02/04/1972  . HIV Screening  07/24/2019*  . Eye exam for diabetics  11/22/2018  . Hemoglobin A1C  01/21/2019  . Flu Shot  02/09/2019  . Complete foot exam   10/30/2019  . Colon Cancer Screening  04/06/2025  .  Hepatitis C: One time screening is recommended by Center for Disease Control  (CDC) for  adults born from [birth year] through 1963.   Completed  . Pneumonia vaccines  Completed  *Topic was postponed. The date shown is not the original due date.

## 2018-10-30 NOTE — Progress Notes (Signed)
Subjective:   Andres [name] is a 68 y.o. male who presents for a Welcome to Medicare exam.   Diabetes  He presents for his follow-up diabetic visit. He has type 2 diabetes mellitus. His disease course has been stable. There are no hypoglycemic associated symptoms. Pertinent negatives for diabetes include no blurred vision and no chest pain. There are no hypoglycemic complications. Diabetic complications include nephropathy. Risk factors for coronary artery disease include diabetes mellitus, dyslipidemia, hypertension and male sex. His breakfast blood glucose is taken between 8-9 am. His breakfast blood glucose range is generally 90-110 mg/dl. An ACE inhibitor/angiotensin II receptor blocker is being taken.  Hypertension  This is a chronic problem. The current episode started more than 1 year ago. The problem has been gradually improving since onset. The problem is controlled. Pertinent negatives include no blurred vision, chest pain, palpitations or shortness of breath.  He reports compliance with meds.    Review of Systems:  Review of Systems  Constitutional: Negative.   HENT: Negative.   Eyes: Negative.  Negative for blurred vision.  Respiratory: Negative.  Negative for shortness of breath.   Cardiovascular: Negative.  Negative for chest pain and palpitations.  Gastrointestinal: Negative.   Genitourinary: Negative.   Musculoskeletal: Negative.   Skin: Negative.   Neurological: Negative.   Endo/Heme/Allergies: Negative.   Psychiatric/Behavioral: Negative.    Cardiac Risk Factors include: diabetes mellitus;hypertension, high cholesterol     Objective:     Vitals:   10/30/18 0939  BP: 116/78  Pulse: 90  Temp: 98.1 F (36.7 C)  Body mass index is 31.53 kg/m.  Physical Exam  Constitutional: He is oriented to person, place, and time and well-developed, well-nourished, and in no distress.  HENT:  Head: Normocephalic and atraumatic.  Right Ear: Hearing normal.  Left Ear:  Hearing normal.  Nose: Nose normal.  Mouth/Throat: Oropharynx is clear and moist. No oropharyngeal exudate.  B/l excess cerumen  Eyes: Pupils are equal, round, and reactive to light. Conjunctivae and EOM are normal.  Neck: Normal range of motion. Neck supple.  Cardiovascular: Normal rate, regular rhythm, normal heart sounds and intact distal pulses.  Pulmonary/Chest: Effort normal and breath sounds normal.  Abdominal: Soft. Bowel sounds are normal.  Genitourinary:    Genitourinary Comments: Deferred as per patient   Musculoskeletal: Normal range of motion.     Comments: Diabetic foot exam performed bilaterally. Five areas sensed, all five were positive. Pos peripheral pulses, PT/DP. No callus.   Neurological: He is alert and oriented to person, place, and time. He has normal reflexes. No cranial nerve deficit. Gait normal. GCS score is 15.  Skin: Skin is warm and dry.  Psychiatric: Affect normal.  Nursing note and vitals reviewed.   Medications Outpatient Encounter Medications as of 10/30/2018  Medication Sig  . ACCU-CHEK FASTCLIX LANCETS MISC USE TO CHECK BLOOD SUGAR TID BEFORE MEALS  . ACCU-CHEK GUIDE test strip USE TO CHECK BLOOD SUGAR TID BEFORE MEALS  . Arginine 500 MG CAPS Take by mouth.  [name] aspirin 81 MG tablet Take 81 mg by mouth daily.  . cholecalciferol (VITAMIN D) 1000 UNITS tablet Take 1,000 Units by mouth daily.  . Cyanocobalamin (VITAMIN B 12 PO) Take 1 capsule by mouth.  . fluticasone (CUTIVATE) 0.05 % cream Apply topically 2 (two) times daily.  [name] levocetirizine (XYZAL) 5 MG tablet Take 5 mg by mouth every evening.  . Multiple Vitamin (MULTIVITAMIN) tablet Take 1 tablet by mouth daily.  [name] olmesartan-hydrochlorothiazide (BENICAR HCT) 20-12.5  MG tablet TAKE 1 TABLET BY MOUTH ONCE DAILY.  . rosuvastatin (CRESTOR) 10 MG tablet Take 10 mg by mouth daily.  . sitaGLIPtin-metformin ([name]) 50-1000 MG tablet Take 1 tablet by mouth 2 (two) times daily with a meal.  . tadalafil  (CIALIS) 5 MG tablet Take 1 tablet (5 mg total) by mouth daily.  . [DISCONTINUED] fluticasone (CUTIVATE) 0.05 % cream   . [DISCONTINUED] Insulin Glargine (TOUJEO MAX SOLOSTAR) 300 UNIT/ML SOPN Inject 3 Units into the skin at bedtime.   . [DISCONTINUED] sitaGLIPtin-metformin ([name]) 50-1000 MG tablet Take 1 tablet by mouth 2 (two) times daily with a meal.  . [DISCONTINUED] SURE COMFORT PEN NEEDLES 31G X 8 MM MISC USE AS DIRECTED WITH INSULIN PEN.   No facility-administered encounter medications on file as of 10/30/2018.      History: Past Medical History:  Diagnosis Date  . Diabetes ([place])   . High cholesterol   . Hypertension    states under control with med., has been on med. x 5 yr.  . Seasonal allergies   . Umbilical hernia 03/3266   Past Surgical History:  Procedure Laterality Date  . INSERTION OF MESH N/A 09/25/2014   Procedure: INSERTION OF MESH;  Surgeon: Donnie Mesa, MD;  Location: [facility];  Service: General;  Laterality: N/A;  . ORIF PATELLA FRACTURE Right 06/16/2008  . TOTAL HIP ARTHROPLASTY Left 12/17/2007  . UMBILICAL HERNIA REPAIR N/A 09/25/2014   Procedure: UMBILICAL HERNIA REPAIR WITH MESH;  Surgeon: Donnie Mesa, MD;  Location: [facility];  Service: General;  Laterality: N/A;    Family History  Problem Relation Age of Onset  . Healthy Mother   . Early death Father    Social History   Occupational History  . Not on file  Tobacco Use  . Smoking status: Former Smoker    Packs/day: 0.50    Years: 22.00    Pack years: 11.00    Types: Cigarettes    Last attempt to quit: 07/11/2011    Years since quitting: 7.3  . Smokeless tobacco: Never Used  Substance and Sexual Activity  . Alcohol use: No  . Drug use: No  . Sexual activity: Not on file   Tobacco Counseling Counseling given: Not Answered   Immunizations and Health Maintenance Immunization History  Administered Date(s) Administered  . Influenza, High Dose Seasonal PF  04/11/2018  . Pneumococcal Polysaccharide-23 07/23/2018   Health Maintenance Due  Topic Date Due  . TETANUS/TDAP  02/04/1972    Activities of Daily Living In your present state of health, do you have any difficulty performing the following activities: 10/30/2018  Hearing? N  Vision? N  Difficulty concentrating or making decisions? N  Walking or climbing stairs? N  Dressing or bathing? N  Doing errands, shopping? N  Preparing Food and eating ? N  Using the Toilet? N  In the past six months, have you accidently leaked urine? N  Do you have problems with loss of bowel control? N  Managing your Medications? N  Managing your Finances? N  Housekeeping or managing your Housekeeping? N  Some recent data might be hidden    Advanced Directives: Does Patient Have a Medical Advance Directive?: No Would patient like information on creating a medical advance directive?: Yes (MAU/Ambulatory/Procedural Areas - Information given)    Assessment:    This is a routine wellness  examination for this patient .   Vision/Hearing screen  Hearing Screening   125Hz  250Hz  500Hz  1000Hz  2000Hz  3000Hz   4000Hz  6000Hz  8000Hz   Right ear:   25 25 25  25     Left ear:   25 25 25  25       Visual Acuity Screening   Right eye Left eye Both eyes  Without correction:     With correction: 20/30 20/30 20/20     Dietary issues and exercise activities discussed:  Current Exercise Habits: Home exercise routine, Type of exercise: walking, Time (Minutes): 60, Frequency (Times/Week): 3, Weekly Exercise (Minutes/Week): 180, Intensity: Moderate  Goals    . Exercise 150 min/wk Moderate Activity       Depression Screen PHQ 2/9 Scores 10/30/2018 07/23/2018 04/11/2018  PHQ - 2 Score 0 0 0     Fall Risk Fall Risk  10/30/2018  Falls in the past year? 0    Cognitive Function     6CIT Screen 10/30/2018  What Year? 0 points  What month? 0 points  What time? 0 points  Count back from 20 0 points  Months in reverse  0 points  Repeat phrase 2 points  Total Score 2    Patient Care Team: Glendale Chard, MD as PCP - General (Internal Medicine) Harl Bowie, Alphonse Guild, MD as PCP - Cardiology (Cardiology)     Plan:   1. Encounter for initial preventive physical examination covered by Medicare  A full exam was performed. DRE deferred per patient's request. THE ANNUAL WELLNESS VISIT WAS PERFORMED INCLUDING DISCUSSION OF ADVANCED DIRECTIVES AND ASSESSMENT OF COGNITIVE FUNCTION. PATIENT HAS BEEN ADVISED TO GET 30-45 MINUTES REGULAR EXERCISE NO LESS THAN FOUR TO FIVE DAYS PER WEEK - BOTH WEIGHTBEARING EXERCISES AND AEROBIC ARE RECOMMENDED.  HE IS ADVISED TO FOLLOW A HEALTHY DIET WITH AT LEAST SIX FRUITS/VEGGIES PER DAY, DECREASE INTAKE OF RED MEAT, AND TO INCREASE FISH INTAKE TO TWO DAYS PER WEEK.  MEATS/FISH SHOULD NOT BE FRIED, BAKED OR BROILED IS PREFERABLE.  I SUGGEST WEARING SPF 50 SUNSCREEN ON EXPOSED PARTS AND ESPECIALLY WHEN IN THE DIRECT SUNLIGHT FOR AN EXTENDED PERIOD OF TIME.  PLEASE AVOID FAST FOOD RESTAURANTS AND INCREASE YOUR WATER INTAKE.  - EKG 12-Lead  2. Type 2 diabetes mellitus with stage 2 chronic kidney disease, with long-term current use of insulin ([place])  Diabetic foot exam was performed. I DISCUSSED WITH THE PATIENT AT LENGTH REGARDING THE GOALS OF GLYCEMIC CONTROL AND POSSIBLE LONG-TERM COMPLICATIONS.  I  ALSO STRESSED THE IMPORTANCE OF COMPLIANCE WITH HOME GLUCOSE MONITORING, DIETARY RESTRICTIONS INCLUDING AVOIDANCE OF SUGARY DRINKS/PROCESSED FOODS,  ALONG WITH REGULAR EXERCISE.  I  ALSO STRESSED THE IMPORTANCE OF ANNUAL EYE EXAMS, SELF FOOT CARE AND COMPLIANCE WITH OFFICE VISITS.  - Referral to Chronic Care Management Services - Basic Metabolic Panel with GFR - Hemoglobin A1c - POCT urinalysis dipstick - POCT UA - Microalbumin - CBC no Diff  3. Hypertensive nephropathy  Well controlled. He will continue with current meds. He is encouraged to avoid adding salt to his foods. EKG performed,  no acute changes noted.   - Referral to Chronic Care Management Services  4. Pure hypercholesterolemia  I will check fasting lipid panel today. He is encouraged to avoid fried foods and to incorporate more exercise into his daily routine.   - Referral to Chronic Care Management Services - Liver Profile  5. Class 1 obesity due to excess calories with serious comorbidity and body mass index (BMI) of 31.0 to 31.9 in adult  Importance of achieving optimal weight to decrease risk of cardiovascular disease and cancers was discussed with the patient in full  detail. He is encouraged to start slowly - start with 10 minutes twice daily at least three to four days per week and to gradually build to 30 minutes five days weekly. He was given tips to incorporate more activity into her daily routine - take stairs when possible, park farther away from grocery stores, etc.    I have personally reviewed and noted the following in the patient's chart:   . Medical and social history . Use of alcohol, tobacco or illicit drugs  . Current medications and supplements . Functional ability and status . Nutritional status . Physical activity . Advanced directives . List of other physicians . Hospitalizations, surgeries, and ER visits in previous 12 months . Vitals . Screenings to include cognitive, depression, and falls . Referrals and appointments  In addition, I have reviewed and discussed with patient certain preventive protocols, quality metrics, and best practice recommendations. A written personalized care plan for preventive services as well as general preventive health recommendations were provided to patient.    [name], MD 10/30/2018

## 2018-10-31 LAB — CBC
Hematocrit: 40 % (ref 37.5–51.0)
Hemoglobin: 13.6 g/dL (ref 13.0–17.7)
MCH: 30.4 pg (ref 26.6–33.0)
MCHC: 34 g/dL (ref 31.5–35.7)
MCV: 89 fL (ref 79–97)
Platelets: 199 10*3/uL (ref 150–450)
RBC: 4.48 x10E6/uL (ref 4.14–5.80)
RDW: 12.6 % (ref 11.6–15.4)
WBC: 7.4 10*3/uL (ref 3.4–10.8)

## 2018-10-31 LAB — BMP8+EGFR
BUN/Creatinine Ratio: 12 (ref 10–24)
BUN: 14 mg/dL (ref 8–27)
CO2: 23 mmol/L (ref 20–29)
Calcium: 9.6 mg/dL (ref 8.6–10.2)
Chloride: 100 mmol/L (ref 96–106)
Creatinine, Ser: 1.21 mg/dL (ref 0.76–1.27)
GFR calc Af Amer: 72 mL/min/{1.73_m2} (ref >59)
GFR calc non Af Amer: 62 mL/min/{1.73_m2} (ref >59)
Glucose: 96 mg/dL (ref 65–99)
Potassium: 4.7 mmol/L (ref 3.5–5.2)
Sodium: 141 mmol/L (ref 134–144)

## 2018-10-31 LAB — HEPATIC FUNCTION PANEL
ALT: 16 IU/L (ref 0–44)
AST: 20 IU/L (ref 0–40)
Albumin: 4.6 g/dL (ref 3.8–4.8)
Alkaline Phosphatase: 52 IU/L (ref 39–117)
Bilirubin Total: 0.3 mg/dL (ref 0.0–1.2)
Bilirubin, Direct: 0.14 mg/dL (ref 0.00–0.40)
Total Protein: 7.4 g/dL (ref 6.0–8.5)

## 2018-10-31 LAB — HEMOGLOBIN A1C
Est. average glucose Bld gHb Est-mCnc: 134 mg/dL
Hgb A1c MFr Bld: 6.3 % — ABNORMAL HIGH (ref 4.8–5.6)

## 2018-11-06 ENCOUNTER — Encounter: Payer: Self-pay | Admitting: Internal Medicine

## 2018-11-07 ENCOUNTER — Ambulatory Visit: Payer: Self-pay

## 2018-11-07 ENCOUNTER — Telehealth: Payer: Self-pay

## 2018-11-07 DIAGNOSIS — Z794 Long term (current) use of insulin: Secondary | ICD-10-CM

## 2018-11-07 DIAGNOSIS — I129 Hypertensive chronic kidney disease with stage 1 through stage 4 chronic kidney disease, or unspecified chronic kidney disease: Secondary | ICD-10-CM

## 2018-11-07 DIAGNOSIS — E1122 Type 2 diabetes mellitus with diabetic chronic kidney disease: Secondary | ICD-10-CM

## 2018-11-07 DIAGNOSIS — N182 Chronic kidney disease, stage 2 (mild): Secondary | ICD-10-CM

## 2018-11-07 DIAGNOSIS — Z0000 Encounter for general adult medical examination without abnormal findings: Principal | ICD-10-CM

## 2018-11-07 DIAGNOSIS — E7800 Pure hypercholesterolemia, unspecified: Secondary | ICD-10-CM

## 2018-11-07 NOTE — Chronic Care Management (AMB) (Signed)
   Care Management   Outreach Note  11/07/2018 Name: Andres [name] MRN: [id number] DOB: 06-24-1951  Referred by: Glendale Chard, MD Reason for referral : Care Coordination (INITIAL CCM RN TELEPHONE OUTREACH)  An unsuccessful telephone outreach was attempted today. The patient was referred to the case management team by Glendale Chard MD for assistance with chronic disease management for type 2 diabetes mellitus with stage 2 chronic kidney disease with long-term current insulin use, Hypertensive nephropathy and Pure hypercholesterolemia.    Follow Up Plan: The CM team will reach out to the patient again over the next 5-7 days.     Barb Merino, RN,CCM Care Management Coordinator [facility]  Direct Phone: [phone]

## 2018-11-14 ENCOUNTER — Telehealth: Payer: Self-pay

## 2018-11-15 ENCOUNTER — Telehealth: Payer: Self-pay

## 2018-11-15 ENCOUNTER — Other Ambulatory Visit: Payer: Self-pay | Admitting: Internal Medicine

## 2018-11-27 ENCOUNTER — Telehealth: Payer: Self-pay

## 2018-11-29 ENCOUNTER — Ambulatory Visit: Payer: Self-pay

## 2018-11-29 DIAGNOSIS — I129 Hypertensive chronic kidney disease with stage 1 through stage 4 chronic kidney disease, or unspecified chronic kidney disease: Secondary | ICD-10-CM

## 2018-11-29 DIAGNOSIS — E1122 Type 2 diabetes mellitus with diabetic chronic kidney disease: Secondary | ICD-10-CM

## 2018-11-29 NOTE — Chronic Care Management (AMB) (Addendum)
Care Management   Outreach Note  11/29/2018 Name: Andres [name] MRN: [id number] DOB: February 04, 1951  Referred by: Glendale Chard, MD Reason for referral : Chronic Care Management (#2 INITIAL CCM RN Telephone Outreach )   A second unsuccessful telephone outreach was attempted today. The patient was referred to the case management team for assistance with chronic care management and care coordination. I left a HIPAA compliant voice message for patient, requesting a return call.   Follow Up Plan: Telephone follow up appointment with CCM team member scheduled for: 7-10 days   Barb Merino, [facility] Care Management Coordinator [place] [facility]  Direct Phone: [phone]

## 2018-11-30 ENCOUNTER — Other Ambulatory Visit: Payer: Self-pay | Admitting: Internal Medicine

## 2018-12-11 ENCOUNTER — Telehealth: Payer: Self-pay

## 2018-12-14 ENCOUNTER — Other Ambulatory Visit: Payer: Self-pay | Admitting: Internal Medicine

## 2018-12-17 ENCOUNTER — Other Ambulatory Visit: Payer: Self-pay

## 2018-12-28 ENCOUNTER — Ambulatory Visit: Payer: Self-pay

## 2018-12-28 DIAGNOSIS — E1122 Type 2 diabetes mellitus with diabetic chronic kidney disease: Secondary | ICD-10-CM

## 2018-12-28 DIAGNOSIS — E7800 Pure hypercholesterolemia, unspecified: Secondary | ICD-10-CM

## 2018-12-28 DIAGNOSIS — N182 Chronic kidney disease, stage 2 (mild): Secondary | ICD-10-CM

## 2018-12-28 DIAGNOSIS — I129 Hypertensive chronic kidney disease with stage 1 through stage 4 chronic kidney disease, or unspecified chronic kidney disease: Secondary | ICD-10-CM

## 2018-12-28 NOTE — Chronic Care Management (AMB) (Addendum)
Care Management   Outreach Note  12/28/2018 Name: Andres [name] MRN: [id number] DOB: 1951/02/26  Referred by: Glendale Chard, MD Reason for referral : Care Coordination (#3 [place] )   Third unsuccessful telephone outreach was attempted today. The patient was referred to the case management team for assistance with chronic care management and care coordination. The patient's primary care provider has been notified of our unsuccessful attempts to make or maintain contact with the patient. The care management team is pleased to engage with this patient at any time in the future should he/she be interested in assistance from the care management team.   Follow Up Plan: A HIPPA compliant phone message was left for the patient providing contact information and requesting a return call.  The care management team is available to follow up with the patient after provider conversation with the patient regarding recommendation for care management engagement and subsequent re-referral to the care management team.    Barb Merino, RN, BSN, CCM Care Management Coordinator [facility]  Direct Phone: [phone]

## 2019-01-02 ENCOUNTER — Telehealth: Payer: Self-pay

## 2019-01-02 ENCOUNTER — Other Ambulatory Visit: Payer: Self-pay

## 2019-01-02 ENCOUNTER — Ambulatory Visit (INDEPENDENT_AMBULATORY_CARE_PROVIDER_SITE_OTHER): Payer: Medicare HMO

## 2019-01-02 DIAGNOSIS — I129 Hypertensive chronic kidney disease with stage 1 through stage 4 chronic kidney disease, or unspecified chronic kidney disease: Secondary | ICD-10-CM

## 2019-01-02 DIAGNOSIS — E7800 Pure hypercholesterolemia, unspecified: Secondary | ICD-10-CM

## 2019-01-02 DIAGNOSIS — Z794 Long term (current) use of insulin: Secondary | ICD-10-CM

## 2019-01-02 DIAGNOSIS — E1122 Type 2 diabetes mellitus with diabetic chronic kidney disease: Secondary | ICD-10-CM

## 2019-01-02 DIAGNOSIS — N182 Chronic kidney disease, stage 2 (mild): Secondary | ICD-10-CM | POA: Diagnosis not present

## 2019-01-03 NOTE — Chronic Care Management (AMB) (Signed)
Chronic Care Management   Initial Visit Note  01/02/2019 Name: Andres [name] MRN: [id number] DOB: 12-Jan-1951  Referred by: Glendale Chard, MD Reason for referral : Chronic Care Management (CCM RNCM Telephone Follow Up )   Andres [name] is a 68 y.o. year old male who is a primary care patient of Glendale Chard, MD. The CCM team was consulted for assistance with chronic disease management and care coordination needs.   Review of patient status, including review of consultants reports, relevant laboratory and other test results, and collaboration with appropriate care team members and the patient's provider was performed as part of comprehensive patient evaluation and provision of chronic care management services.    I received a return call from Andres [name] today. Andres [name] agreed to receiving CCM services and a plan of care was established.   Objective:  Lab Results  Component Value Date   HGBA1C 6.3 (H) 10/30/2018   HGBA1C 6.2 (H) 07/23/2018   HGBA1C 6.2 (H) 04/11/2018   Lab Results  Component Value Date   MICROALBUR 10 10/30/2018   LDLCALC 79 07/23/2018   CREATININE 1.21 10/30/2018   BP Readings from Last 3 Encounters:  10/30/18 116/78  07/23/18 118/74  04/11/18 122/78    Goals Addressed      Patient Stated   . "I am going to start walking again" (pt-stated)       Current Barriers:  Andres [name] Knowledge Deficits related to diabetes disease management and Self health management   Nurse Case Manager Clinical Goal(s):  Andres [name] Over the next 30 days, patient will verbalize basic understanding of diabetes disease process and self health management plan as evidenced by patient will verbalize increased knowledge and understanding of meal planning using the plate method and portion control  . Over the next 30 days, patient will have a routine exercise regimen in place to include daily walking for 10 min or more per day.   CCM RN CM Interventions:  01/02/19 completed initial call with  patient   . Evaluation of current treatment plan related to diabetes and patient's adherence to plan as established by provider. . Provided education to patient re: current A1C of 6.3; discussed target A1C of less than 5.6% to be considered non-diabetic; discussed how implementing daily exercise can help lower blood sugars; discussed ADA recommendations to exercise 150 minutes per week; discussed patient can start off at less and work his way up depending on his preference and toleration; discussed walking is a good form of exercise . Reviewed medications with patient and discussed indication, usage and frequency; discussed patient is having some financial difficulty and will benefit from embedded Pharm D referral  . Collaborated with embedded Pharm D [name] regarding patient needing assistance with cost of several medications and or assistance with locating a new pharmacy . Discussed plans with patient for ongoing care management follow up and provided patient with direct contact information for care management team . Provided patient with printed educational materials related to Meal Planning using the plate method, Know your A1C; signs/symptoms of Hypo/Hyperglycemia; Tips for exercising with Diabetes . Advised patient, providing education and rationale, to check cbg daily before meals and record, calling the CCM team and or PCP office for findings outside established parameters.    Sent in-basket message to embedded BSW Daneen Schick requesting she outreach to patient to assess SDOH  Patient Self Care Activities:  . Self administers medications as prescribed . Attends all scheduled provider appointments . Calls pharmacy for medication  refills . Performs ADL's independently . Performs IADL's independently . Calls provider office for new concerns or questions  Initial goal documentation        Andres [name] was given information about Chronic Care Management services today including:   1. CCM service includes personalized support from designated clinical staff supervised by his physician, including individualized plan of care and coordination with other care providers 2. 24/7 contact phone numbers for assistance for urgent and routine care needs. 3. Service will only be billed when office clinical staff spend 20 minutes or more in a month to coordinate care. 4. Only one practitioner may furnish and bill the service in a calendar month. 5. The patient may stop CCM services at any time (effective at the end of the month) by phone call to the office staff. 6. The patient will be responsible for cost sharing (co-pay) of up to 20% of the service fee (after annual deductible is met).  Patient agreed to services and verbal consent obtained.   Plan:  Telephone follow up appointment with care management team member scheduled for: 01/16/19  Barb Merino, RN, BSN, CCM Care Management Coordinator [place] [facility]  Direct Phone: [phone]

## 2019-01-07 ENCOUNTER — Telehealth: Payer: Self-pay

## 2019-01-10 ENCOUNTER — Ambulatory Visit: Payer: Self-pay

## 2019-01-10 ENCOUNTER — Telehealth: Payer: Self-pay

## 2019-01-10 DIAGNOSIS — N182 Chronic kidney disease, stage 2 (mild): Secondary | ICD-10-CM

## 2019-01-10 DIAGNOSIS — I129 Hypertensive chronic kidney disease with stage 1 through stage 4 chronic kidney disease, or unspecified chronic kidney disease: Secondary | ICD-10-CM

## 2019-01-10 DIAGNOSIS — E1122 Type 2 diabetes mellitus with diabetic chronic kidney disease: Secondary | ICD-10-CM

## 2019-01-10 NOTE — Chronic Care Management (AMB) (Signed)
  Chronic Care Management   Outreach Note  01/10/2019 Name: Andres [name] MRN: [id number] DOB: [birth year]  Referred by: Glendale Chard, MD Reason for referral : Care Coordination   An unsuccessful telephone outreach was attempted today in efforts of conducting an SDOH (social determinants of health) screen. The patient recently enrolled in the chronic care management program.  Follow Up Plan: A HIPPA compliant phone message was left for the patient providing contact information and requesting a return call.  The care management team will reach out to the patient again over the next 10-14 days.   Daneen Schick, BSW, CDP Social Worker, Certified Dementia Practitioner [place] [facility] [place] [facility] [phone]  Total time spent performing care coordination and/or care management activities with the patient by phone or face to face = 5 minutes.

## 2019-01-15 ENCOUNTER — Ambulatory Visit: Payer: Self-pay | Admitting: Pharmacist

## 2019-01-15 ENCOUNTER — Ambulatory Visit: Payer: Medicare HMO | Admitting: Internal Medicine

## 2019-01-15 DIAGNOSIS — E7800 Pure hypercholesterolemia, unspecified: Secondary | ICD-10-CM

## 2019-01-15 DIAGNOSIS — E1122 Type 2 diabetes mellitus with diabetic chronic kidney disease: Secondary | ICD-10-CM

## 2019-01-15 DIAGNOSIS — N182 Chronic kidney disease, stage 2 (mild): Secondary | ICD-10-CM

## 2019-01-16 ENCOUNTER — Telehealth: Payer: Self-pay

## 2019-01-16 ENCOUNTER — Other Ambulatory Visit: Payer: Self-pay | Admitting: Internal Medicine

## 2019-01-16 ENCOUNTER — Other Ambulatory Visit: Payer: Self-pay | Admitting: Pharmacy Technician

## 2019-01-16 NOTE — Patient Outreach (Signed)
[facility]) Care Management  01/16/2019  LEX LINHARES 1951/04/05 [id number]                          Medication Assistance Referral  Referral From: [facility] RPh Jenne Pane [facility] RPh)  Medication/Company: [employer]ial[employer] / [employer] Cares Patient application portion:  Mailed Provider application portion: Interoffice Mailed to [name]  Medication/Company: [name] / [employer] Patient application portion:  Mailed Provider application portion: Interoffice Mailed to [name]   Follow up:  Will follow up with patient in 7-10 business days to confirm application(s) have been received.  [name] Chana Bode [place] Certified Pharmacy Technician [place] [facility] Direct Dial:[phone]

## 2019-01-17 ENCOUNTER — Other Ambulatory Visit: Payer: Self-pay | Admitting: Internal Medicine

## 2019-01-21 DIAGNOSIS — N400 Enlarged prostate without lower urinary tract symptoms: Secondary | ICD-10-CM | POA: Diagnosis not present

## 2019-01-22 ENCOUNTER — Ambulatory Visit: Payer: Self-pay

## 2019-01-22 ENCOUNTER — Telehealth: Payer: Self-pay

## 2019-01-22 DIAGNOSIS — N182 Chronic kidney disease, stage 2 (mild): Secondary | ICD-10-CM

## 2019-01-22 DIAGNOSIS — E1122 Type 2 diabetes mellitus with diabetic chronic kidney disease: Secondary | ICD-10-CM

## 2019-01-22 NOTE — Patient Instructions (Signed)
Visit Information  Goals Addressed            This Visit's Progress     Patient Stated   . I would like to apply for financial assistance for my medications (pt-stated)       Current Barriers:  . Non Adherence to prescribed medication regimen and Financial Barriers  Pharmacist Clinical Goal(s):  [name] Over the next 30 days, patient will work with CCM PharmD to address needs related to applying for financial assistance for [name]  Interventions: . Comprehensive medication review performed. . Advised patient to continue taking all medications as prescribed. Reviewed medication purpose.  Patient denies adverse events.   . Patient states he cannot afford his [name] ($45 copay/month, however patient is now in coverage gap).  He is agreeable to participate in patient assistance program through [employer].  He would also like to apply for the [employer] patient assistance program through [place] as well.  Explained programs and how patient will have to stay engaged.   . Collaboration with [facility] CPhT, Etter Sjogren, who will assist with paperwork and application process. . Will continue to follow   Patient Self Care Activities:  . Self administers medications as prescribed . Attends all scheduled provider appointments . Calls pharmacy for medication refills . Attends church or other social activities . Performs ADL's independently . Performs IADL's independently . Calls provider office for new concerns or questions  Initial goal documentation     . I would like to optimize my medication management of my chronic conditions (pt-stated)       Current Barriers:  [name] Knowledge Deficits related to disease state managment and Non Adherence to prescribed medication regimen  Pharmacist Clinical Goal(s):  [name] Over the next 90 days, patient will work with PharmD to address needs related to optimized  medication management of chronic conditions  Interventions: . Comprehensive medication review performed.   Reviewed medication fill history via insurance claims data confirming patient appears compliant with having his medications filled on time as prescribed by provider. . Reviewed & discussed the following diabetes-related information with patient: o Continue checking blood sugars as directed o Follow ADA recommended "diabetes-friendly" diet  (reviewed healthy snack/food options) o Encouraged patient to exercise as able and as recommended by PCP. o Confirmed current DM regimen: [name] 50-1000 -->1 tablet by mouth twice daily o Patient uses Accu Check Guide glucometer o Reviewed ALL medication purposes/side effects-->patient denies adverse events, denies hypoglycemia, reports FBG<87, most recently blood glucose has been ranged from 112-114 o Most recent A1c is 6.3 on 10/30/18.  Most recent Scr was 1.21 and appears at baseline. o Continue taking all medications as prescribed by provider   Patient Self Care Activities:  . Self administers medications as prescribed . Attends all scheduled provider appointments . Calls pharmacy for medication refills . Performs ADL's independently . Performs IADL's independently  Initial goal documentation        The patient verbalized understanding of instructions provided today and declined a print copy of patient instruction materials.   The care management team will reach out to the patient again over the next 3 weeks.  Regina Eck, PharmD, BCPS Clinical Pharmacist, [facility] [place]: [phone]

## 2019-01-22 NOTE — Chronic Care Management (AMB) (Signed)
  Chronic Care Management   Outreach Note  01/22/2019 Name: Andres [name] MRN: [id number] DOB: 1950/09/18  Referred by: Glendale Chard, MD Reason for referral : Care Coordination   Second unsuccessful outreach call placed to the patient to complete an SDOH screening call. CCM SW left a HIPAA compliant voice message requesting a return call.  Follow Up Plan: CCM SW will attempt a third and final outreach over the next two weeks.  Daneen Schick, BSW, CDP Social Worker, Certified Dementia Practitioner [place] [facility] [place] [facility] [phone]  Total time spent performing care coordination and/or care management activities with the patient by phone or face to face = 5 minutes.

## 2019-01-22 NOTE — Progress Notes (Signed)
Chronic Care Management   Initial Visit Note  01/15/2019 Name: Andres [name] MRN: [id number] DOB: 1951-02-21  Referred by: Glendale Chard, MD Reason for referral : Chronic Care Management   Andres [name] is a 68 y.o. year old male who is a primary care patient of Glendale Chard, MD. The CCM team was consulted for assistance with chronic disease management and care coordination needs.   Review of patient status, including review of consultants reports, relevant laboratory and other test results, and collaboration with appropriate care team members and the patient's provider was performed as part of comprehensive patient evaluation and provision of chronic care management services.    I spoke with Andres [name] by telephone today.  Objective:   Goals Addressed            This Visit's Progress     Patient Stated   . I would like to apply for financial assistance for my medications (pt-stated)       Current Barriers:  . Non Adherence to prescribed medication regimen and Financial Barriers  Pharmacist Clinical Goal(s):  [name] Over the next 30 days, patient will work with CCM PharmD to address needs related to applying for financial assistance for [name]  Interventions: . Comprehensive medication review performed. . Advised patient to continue taking all medications as prescribed. Reviewed medication purpose.  Patient denies adverse events.   . Patient states he cannot afford his [name] ($45 copay/month, however patient is now in coverage gap).  He is agreeable to participate in patient assistance program through [employer].  He would also like to apply for the Cialis patient assistance program through [place] as well.  Explained programs and how patient will have to stay engaged.   . Collaboration with [facility] CPhT, Andres [name], who will assist with paperwork and application process. . Will continue to follow   Patient Self Care Activities:  . Self administers medications as prescribed .  Attends all scheduled provider appointments . Calls pharmacy for medication refills . Attends church or other social activities . Performs ADL's independently . Performs IADL's independently . Calls provider office for new concerns or questions  Initial goal documentation     . I would like to optimize my medication management of my chronic conditions (pt-stated)       Current Barriers:  [name] Knowledge Deficits related to disease state managment and Non Adherence to prescribed medication regimen  Pharmacist Clinical Goal(s):  [name] Over the next 90 days, patient will work with PharmD to address needs related to optimized  medication management of chronic conditions  Interventions: . Comprehensive medication review performed.  Reviewed medication fill history via insurance claims data confirming patient appears compliant with having his medications filled on time as prescribed by provider. . Reviewed & discussed the following diabetes-related information with patient: o Continue checking blood sugars as directed o Follow ADA recommended "diabetes-friendly" diet  (reviewed healthy snack/food options) o Encouraged patient to exercise as able and as recommended by PCP. o Confirmed current DM regimen: [name] 50-1000 -->1 tablet by mouth twice daily o Patient uses Accu Check Guide glucometer o Reviewed ALL medication purposes/side effects-->patient denies adverse events, denies hypoglycemia, reports FBG<87, most recently blood glucose has been ranged from 112-114 o Most recent A1c is 6.3 on 10/30/18.  Most recent Scr was 1.21 and appears at baseline. o Continue taking all medications as prescribed by provider   Patient Self Care Activities:  . Self administers medications as prescribed . Attends all scheduled provider appointments .  Calls pharmacy for medication refills . Performs ADL's independently . Performs IADL's independently  Initial goal documentation         Plan:   The care  management team will reach out to the patient again over the next 3 weeks.  Regina Eck, PharmD, BCPS Clinical Pharmacist, [facility] [place]: [phone]

## 2019-01-28 DIAGNOSIS — N5201 Erectile dysfunction due to arterial insufficiency: Secondary | ICD-10-CM | POA: Diagnosis not present

## 2019-01-28 DIAGNOSIS — R3912 Poor urinary stream: Secondary | ICD-10-CM | POA: Diagnosis not present

## 2019-02-06 ENCOUNTER — Telehealth: Payer: Self-pay

## 2019-02-06 ENCOUNTER — Ambulatory Visit: Payer: Self-pay

## 2019-02-06 DIAGNOSIS — N182 Chronic kidney disease, stage 2 (mild): Secondary | ICD-10-CM

## 2019-02-06 DIAGNOSIS — E1122 Type 2 diabetes mellitus with diabetic chronic kidney disease: Secondary | ICD-10-CM

## 2019-02-06 NOTE — Chronic Care Management (AMB) (Signed)
  Chronic Care Management   Social Work Note  02/06/2019 Name: Andres [name] MRN: [id number] DOB: 04-22-1951  Third unsuccessful outreach attempt placed by CCM SW to conduct an SDOH (Social Determinants of Health) screening call. CCM SW left a HIPAA compliant voice message requesting a return call,  Follow Up Plan: No planned SW follow up at this time. CCM SW has collaborated with CCM team to notify CCM SW signing off due to inability to contact the patient. The patient will remain active with CCM pharmD and RN Case Manager to address chronic care needs.  Daneen Schick, BSW, CDP Social Worker, Certified Dementia Practitioner [place] [facility] [place] [facility] [phone]

## 2019-02-07 ENCOUNTER — Ambulatory Visit: Payer: Self-pay | Admitting: Pharmacist

## 2019-02-07 DIAGNOSIS — Z794 Long term (current) use of insulin: Secondary | ICD-10-CM

## 2019-02-07 DIAGNOSIS — E1122 Type 2 diabetes mellitus with diabetic chronic kidney disease: Secondary | ICD-10-CM

## 2019-02-07 DIAGNOSIS — N182 Chronic kidney disease, stage 2 (mild): Secondary | ICD-10-CM

## 2019-02-07 DIAGNOSIS — E7800 Pure hypercholesterolemia, unspecified: Secondary | ICD-10-CM

## 2019-02-08 ENCOUNTER — Telehealth: Payer: Self-pay

## 2019-02-12 ENCOUNTER — Other Ambulatory Visit: Payer: Self-pay

## 2019-02-12 ENCOUNTER — Other Ambulatory Visit: Payer: Self-pay | Admitting: Internal Medicine

## 2019-02-12 DIAGNOSIS — N529 Male erectile dysfunction, unspecified: Secondary | ICD-10-CM

## 2019-02-12 MED ORDER — TADALAFIL 5 MG PO TABS: 5.0000 mg | ORAL_TABLET | Freq: Every day | ORAL | 1 refills | Status: DC

## 2019-02-12 NOTE — Patient Instructions (Signed)
Visit Information  Goals Addressed            This Visit's Progress     Patient Stated   . I would like to apply for financial assistance for my medications (pt-stated)       Current Barriers:  . Non Adherence to prescribed medication regimen and Financial Barriers  Pharmacist Clinical Goal(s):  [name] Over the next 45 days, patient will work with CCM PharmD to address needs related to applying for financial assistance for [name]  Interventions: . Comprehensive medication review performed. . Advised patient to continue taking all medications as prescribed. Reviewed medication purpose.  Patient denies adverse events.   . Patient states he cannot afford his [name] ($45 copay/month, however patient is now in coverage gap).  He is agreeable to participate in patient assistance program through [employer].  After further review, patient exceeds income requirements for [employer] Patient assistance program.  We will continue to obtain [employer] patient assistance approval for [name].  Encouraged patient to mail back information for [employer].  Explained [employer] program and how patient will have to stay engaged.   . Collaboration with [facility] CPhT, Etter Sjogren, who will assist with paperwork and application process. . Will continue to follow   Patient Self Care Activities:  . Self administers medications as prescribed . Attends all scheduled provider appointments . Calls pharmacy for medication refills . Attends church or other social activities . Performs ADL's independently . Performs IADL's independently . Calls provider office for new concerns or questions  Please see past updates related to this goal by clicking on the "Past Updates" button in the selected goal      . I would like to optimize my medication management of my chronic conditions (pt-stated)       Current Barriers:  [name] Knowledge Deficits related to disease state managment and Non Adherence to prescribed medication regimen  Pharmacist  Clinical Goal(s):  [name] Over the next 90 days, patient will work with PharmD to address needs related to optimized  medication management of chronic conditions  Interventions: . Comprehensive medication review performed.  Reviewed medication fill history via insurance claims data confirming patient appears compliant with having his medications filled on time as prescribed by provider. . Reviewed & discussed the following diabetes-related information with patient: o Continue checking blood sugars as directed o Follow ADA recommended "diabetes-friendly" diet  (reviewed healthy snack/food options) o Encouraged patient to exercise as able and as recommended by PCP. o Confirmed current DM regimen: [name] 50-1000 -->1 tablet by mouth twice daily o Patient uses Accu Check Guide glucometer o Reviewed ALL medication purposes/side effects-->patient denies adverse events, denies hypoglycemia, reports FBG 110-115.  Encouraged patient to continue taking all medications as prescribed. o Most recent A1c is 6.3 on 10/30/18.  Most recent Scr was 1.21 and appears at baseline. o Antihyperlipidemia regimen: rosuvastatin 10mg  daily. o Antihypertensive regimen: olmesartan/HCTZ 20mg /12.5mg  daily o Continue taking all medications as prescribed by provider   Patient Self Care Activities:  . Self administers medications as prescribed . Attends all scheduled provider appointments . Calls pharmacy for medication refills . Performs ADL's independently . Performs IADL's independently  Please see past updates related to this goal by clicking on the "Past Updates" button in the selected goal         The patient verbalized understanding of instructions provided today and declined a print copy of patient instruction materials.   The care management team will reach out to the patient again over the next  4-6 weeks.  Regina Eck, PharmD, BCPS Clinical Pharmacist, [facility] [place]: [phone]

## 2019-02-12 NOTE — Progress Notes (Signed)
Chronic Care Management   Visit Note  02/07/2019 Name: Andres [name] MRN: [id number] DOB: Oct 21, 1950  Referred by: Glendale Chard, MD Reason for referral : Chronic Care Management   Andres [name] is a 68 y.o. year old male who is a primary care patient of Glendale Chard, MD. The CCM team was consulted for assistance with chronic disease management and care coordination needs.   Review of patient status, including review of consultants reports, relevant laboratory and other test results, and collaboration with appropriate care team members and the patient's provider was performed as part of comprehensive patient evaluation and provision of chronic care management services.    I spoke with Mr. Brander by telephone today.  Objective:   Goals Addressed            This Visit's Progress     Patient Stated   . I would like to apply for financial assistance for my medications (pt-stated)       Current Barriers:  . Non Adherence to prescribed medication regimen and Financial Barriers  Pharmacist Clinical Goal(s):  [name] Over the next 45 days, patient will work with CCM PharmD to address needs related to applying for financial assistance for [name]  Interventions: . Comprehensive medication review performed. . Advised patient to continue taking all medications as prescribed. Reviewed medication purpose.  Patient denies adverse events.   . Patient states he cannot afford his [name] ($45 copay/month, however patient is now in coverage gap).  He is agreeable to participate in patient assistance program through [employer].  After further review, patient exceeds income requirements for [employer] Patient assistance program.  We will continue to obtain [employer] patient assistance approval for [name].  Encouraged patient to mail back information for [employer].  Explained [employer] program and how patient will have to stay engaged.   . Collaboration with [facility] CPhT, Etter Sjogren, who will assist with paperwork and  application process. . Will continue to follow   Patient Self Care Activities:  . Self administers medications as prescribed . Attends all scheduled provider appointments . Calls pharmacy for medication refills . Attends church or other social activities . Performs ADL's independently . Performs IADL's independently . Calls provider office for new concerns or questions  Please see past updates related to this goal by clicking on the "Past Updates" button in the selected goal      . I would like to optimize my medication management of my chronic conditions (pt-stated)       Current Barriers:  [name] Knowledge Deficits related to disease state managment and Non Adherence to prescribed medication regimen  Pharmacist Clinical Goal(s):  [name] Over the next 90 days, patient will work with PharmD to address needs related to optimized  medication management of chronic conditions  Interventions: . Comprehensive medication review performed.  Reviewed medication fill history via insurance claims data confirming patient appears compliant with having his medications filled on time as prescribed by provider. . Reviewed & discussed the following diabetes-related information with patient: o Continue checking blood sugars as directed o Follow ADA recommended "diabetes-friendly" diet  (reviewed healthy snack/food options) o Encouraged patient to exercise as able and as recommended by PCP. o Confirmed current DM regimen: [name] 50-1000 -->1 tablet by mouth twice daily o Patient uses Accu Check Guide glucometer o Reviewed ALL medication purposes/side effects-->patient denies adverse events, denies hypoglycemia, reports FBG 110-115.  Encouraged patient to continue taking all medications as prescribed. o Most recent A1c is 6.3 on 10/30/18.  Most recent  Scr was 1.21 and appears at baseline. o Antihyperlipidemia regimen: rosuvastatin 10mg  daily. o Antihypertensive regimen: olmesartan/HCTZ 20mg /12.5mg  daily o  Continue taking all medications as prescribed by provider   Patient Self Care Activities:  . Self administers medications as prescribed . Attends all scheduled provider appointments . Calls pharmacy for medication refills . Performs ADL's independently . Performs IADL's independently  Please see past updates related to this goal by clicking on the "Past Updates" button in the selected goal          Plan:   The care management team will reach out to the patient again over the next 4-6 weeks.  Regina Eck, PharmD, BCPS Clinical Pharmacist, [facility] [place]: [phone]

## 2019-02-18 ENCOUNTER — Telehealth: Payer: Self-pay

## 2019-02-19 DIAGNOSIS — H5200 Hypermetropia, unspecified eye: Secondary | ICD-10-CM | POA: Diagnosis not present

## 2019-02-19 LAB — HM DIABETES EYE EXAM

## 2019-03-05 ENCOUNTER — Other Ambulatory Visit: Payer: Self-pay

## 2019-03-05 ENCOUNTER — Ambulatory Visit (INDEPENDENT_AMBULATORY_CARE_PROVIDER_SITE_OTHER): Payer: Medicare HMO | Admitting: Internal Medicine

## 2019-03-05 ENCOUNTER — Encounter: Payer: Self-pay | Admitting: Internal Medicine

## 2019-03-05 VITALS — BP 118/66 | HR 78 | Temp 97.6°F | Ht 70.8 in | Wt 226.6 lb

## 2019-03-05 DIAGNOSIS — N182 Chronic kidney disease, stage 2 (mild): Secondary | ICD-10-CM

## 2019-03-05 DIAGNOSIS — Z794 Long term (current) use of insulin: Secondary | ICD-10-CM

## 2019-03-05 DIAGNOSIS — E1122 Type 2 diabetes mellitus with diabetic chronic kidney disease: Secondary | ICD-10-CM

## 2019-03-05 DIAGNOSIS — Z6831 Body mass index (BMI) 31.0-31.9, adult: Secondary | ICD-10-CM | POA: Diagnosis not present

## 2019-03-05 DIAGNOSIS — E6609 Other obesity due to excess calories: Secondary | ICD-10-CM

## 2019-03-05 DIAGNOSIS — Z23 Encounter for immunization: Secondary | ICD-10-CM

## 2019-03-05 DIAGNOSIS — I129 Hypertensive chronic kidney disease with stage 1 through stage 4 chronic kidney disease, or unspecified chronic kidney disease: Secondary | ICD-10-CM

## 2019-03-05 MED ORDER — TETANUS-DIPHTH-ACELL PERTUSSIS 5-2.5-18.5 LF-MCG/0.5 IM SUSP: 0.5000 mL | Freq: Once | INTRAMUSCULAR | 0 refills | Status: AC

## 2019-03-05 NOTE — Progress Notes (Signed)
Subjective:     Patient ID: Andres [name] , male    DOB: [birth year] , 68 y.o.   MRN: [id number]   Chief Complaint  Patient presents with  . Diabetes  . Hypertension    HPI  Diabetes He presents for his follow-up diabetic visit. He has type 2 diabetes mellitus. His disease course has been stable. There are no hypoglycemic associated symptoms. Pertinent negatives for diabetes include no blurred vision and no chest pain. There are no hypoglycemic complications. Diabetic complications include nephropathy. Risk factors for coronary artery disease include diabetes mellitus, dyslipidemia, hypertension and male sex. He is following a diabetic diet. His breakfast blood glucose is taken between 8-9 am. His breakfast blood glucose range is generally 90-110 mg/dl. An ACE inhibitor/angiotensin II receptor blocker is being taken. Eye exam is not current.  Hypertension This is a chronic problem. The current episode started more than 1 year ago. The problem has been gradually improving since onset. The problem is controlled. Pertinent negatives include no blurred vision, chest pain, palpitations or shortness of breath. Past treatments include angiotensin blockers and diuretics. The current treatment provides moderate improvement.     Past Medical History:  Diagnosis Date  . Diabetes ([place])   . High cholesterol   . Hypertension    states under control with med., has been on med. x 5 yr.  . Seasonal allergies   . Umbilical hernia [id number]/9037     Family History  Problem Relation Age of Onset  . Healthy Mother   . Early death Father      Current Outpatient Medications:  .  ACCU-CHEK FASTCLIX LANCETS MISC, USE TO CHECK BLOOD SUGAR TID BEFORE MEALS, Disp: , Rfl: 11 .  ACCU-CHEK GUIDE test strip, USE TO CHECK BLOOD SUGAR TID BEFORE MEALS, Disp: , Rfl: 11 .  Arginine 500 MG CAPS, Take by mouth., Disp: , Rfl:  .  aspirin 81 MG tablet, Take 81 mg by mouth daily., Disp: , Rfl:  .  cholecalciferol (VITAMIN  D) 1000 UNITS tablet, Take 1,000 Units by mouth daily., Disp: , Rfl:  .  Cyanocobalamin (VITAMIN B 12 PO), Take 1 capsule by mouth., Disp: , Rfl:  .  fluticasone (CUTIVATE) 0.05 % cream, Apply topically 2 (two) times daily., Disp: 60 g, Rfl: 2 .  [name] 50-1000 MG tablet, TAKE 1 TABLET BY MOUTH TWICE DAILY WITH A MEAL., Disp: 60 tablet, Rfl: 0 .  levocetirizine (XYZAL) 5 MG tablet, TAKE 1 TABLET BY MOUTH EVERY EVENING., Disp: 90 tablet, Rfl: 0 .  Multiple Vitamin (MULTIVITAMIN) tablet, Take 1 tablet by mouth daily., Disp: , Rfl:  .  olmesartan-hydrochlorothiazide (BENICAR HCT) 20-12.5 MG tablet, TAKE 1 TABLET BY MOUTH ONCE DAILY., Disp: 30 tablet, Rfl: 0 .  rosuvastatin (CRESTOR) 10 MG tablet, TAKE (1) TABLET BY MOUTH DAILY., Disp: 90 tablet, Rfl: 0 .  tadalafil (CIALIS) 5 MG tablet, Take 1 tablet (5 mg total) by mouth daily., Disp: 90 tablet, Rfl: 1 .  Tdap (BOOSTRIX) 5-2.5-18.5 LF-MCG/0.5 injection, Inject 0.5 mLs into the muscle once for 1 dose., Disp: 0.5 mL, Rfl: 0   No Known Allergies   Review of Systems  Constitutional: Negative.   Eyes: Negative for blurred vision.  Respiratory: Negative.  Negative for shortness of breath.   Cardiovascular: Negative.  Negative for chest pain and palpitations.  Gastrointestinal: Negative.   Neurological: Negative.   Psychiatric/Behavioral: Negative.      Today's Vitals   03/05/19 0951  BP: 118/66  Pulse: 78  Temp:  97.6 F (36.4 C)  TempSrc: Oral  Weight: 226 lb 9.6 oz (102.8 kg)  Height: 5' 10.8" (1.798 m)  PainSc: 1   PainLoc: Neck   Body mass index is 31.78 kg/m.   Objective:  Physical Exam Vitals signs and nursing note reviewed.  Constitutional:      Appearance: Normal appearance.  Cardiovascular:     Rate and Rhythm: Normal rate and regular rhythm.     Heart sounds: Normal heart sounds.  Pulmonary:     Effort: Pulmonary effort is normal.     Breath sounds: Normal breath sounds.  Skin:    General: Skin is warm.   Neurological:     General: No focal deficit present.     Mental Status: He is alert.  Psychiatric:        Mood and Affect: Mood normal.         Assessment And Plan:     1. Type 2 diabetes mellitus with stage 2 chronic kidney disease, with long-term current use of insulin (HCC)  Chronic, this has improved greatly over the past several months. I will check an a1c today. He was taken off of insulin at his last visit. If there has been minimal change in his levels, he will be able to continue to remain off of insulin.   - Lipid panel - CMP14+EGFR - Hemoglobin A1c  2. Hypertensive nephropathy  Chronic. Well controlled. He will continue with current meds.  I will check his renal function today as well.   3. Class 1 obesity due to excess calories with serious comorbidity and body mass index (BMI) of 31.0 to 31.9 in adult  He is encouraged to strive for BMI less than 27 to decrease cardiac risk. He is encouraged to increase daily activity, he was reminded this will help to further improve his blood sugars.   4. Need for vaccination  Rx for Boostrix (Tdap) was sent to his pharmacy. If they are not administering vaccines at this time, he is encouraged to pick up rx and bring to this office for administration. He verbalizes understanding of his treatment plan.   Andres Greenland, MD    THE PATIENT IS ENCOURAGED TO PRACTICE SOCIAL DISTANCING DUE TO THE COVID-19 PANDEMIC.

## 2019-03-05 NOTE — Patient Instructions (Signed)
Diabetes Mellitus and Exercise Exercising regularly is important for your overall health, especially when you have diabetes (diabetes mellitus). Exercising is not only about losing weight. It has many other health benefits, such as increasing muscle strength and bone density and reducing body fat and stress. This leads to improved fitness, flexibility, and endurance, all of which result in better overall health. Exercise has additional benefits for people with diabetes, including:  Reducing appetite.  Helping to lower and control blood glucose.  Lowering blood pressure.  Helping to control amounts of fatty substances (lipids) in the blood, such as cholesterol and triglycerides.  Helping the body to respond better to insulin (improving insulin sensitivity).  Reducing how much insulin the body needs.  Decreasing the risk for heart disease by: ? Lowering cholesterol and triglyceride levels. ? Increasing the levels of good cholesterol. ? Lowering blood glucose levels. What is my activity plan? Your health care provider or certified diabetes educator can help you make a plan for the type and frequency of exercise (activity plan) that works for you. Make sure that you:  Do at least 150 minutes of moderate-intensity or vigorous-intensity exercise each week. This could be brisk walking, biking, or water aerobics. ? Do stretching and strength exercises, such as yoga or weightlifting, at least 2 times a week. ? Spread out your activity over at least 3 days of the week.  Get some form of physical activity every day. ? Do not go more than 2 days in a row without some kind of physical activity. ? Avoid being inactive for more than 30 minutes at a time. Take frequent breaks to walk or stretch.  Choose a type of exercise or activity that you enjoy, and set realistic goals.  Start slowly, and gradually increase the intensity of your exercise over time. What do I need to know about managing my  diabetes?   Check your blood glucose before and after exercising. ? If your blood glucose is 240 mg/dL (13.3 mmol/L) or higher before you exercise, check your urine for ketones. If you have ketones in your urine, do not exercise until your blood glucose returns to normal. ? If your blood glucose is 100 mg/dL (5.6 mmol/L) or lower, eat a snack containing 15-20 grams of carbohydrate. Check your blood glucose 15 minutes after the snack to make sure that your level is above 100 mg/dL (5.6 mmol/L) before you start your exercise.  Know the symptoms of low blood glucose (hypoglycemia) and how to treat it. Your risk for hypoglycemia increases during and after exercise. Common symptoms of hypoglycemia can include: ? Hunger. ? Anxiety. ? Sweating and feeling clammy. ? Confusion. ? Dizziness or feeling light-headed. ? Increased heart rate or palpitations. ? Blurry vision. ? Tingling or numbness around the mouth, lips, or tongue. ? Tremors or shakes. ? Irritability.  Keep a rapid-acting carbohydrate snack available before, during, and after exercise to help prevent or treat hypoglycemia.  Avoid injecting insulin into areas of the body that are going to be exercised. For example, avoid injecting insulin into: ? The arms, when playing tennis. ? The legs, when jogging.  Keep records of your exercise habits. Doing this can help you and your health care provider adjust your diabetes management plan as needed. Write down: ? Food that you eat before and after you exercise. ? Blood glucose levels before and after you exercise. ? The type and amount of exercise you have done. ? When your insulin is expected to peak, if you use   insulin. Avoid exercising at times when your insulin is peaking.  When you start a new exercise or activity, work with your health care provider to make sure the activity is safe for you, and to adjust your insulin, medicines, or food intake as needed.  Drink plenty of water while  you exercise to prevent dehydration or heat stroke. Drink enough fluid to keep your urine clear or pale yellow. Summary  Exercising regularly is important for your overall health, especially when you have diabetes (diabetes mellitus).  Exercising has many health benefits, such as increasing muscle strength and bone density and reducing body fat and stress.  Your health care provider or certified diabetes educator can help you make a plan for the type and frequency of exercise (activity plan) that works for you.  When you start a new exercise or activity, work with your health care provider to make sure the activity is safe for you, and to adjust your insulin, medicines, or food intake as needed. This information is not intended to replace advice given to you by your health care provider. Make sure you discuss any questions you have with your health care provider. Document Released: 09/17/2003 Document Revised: 01/19/2017 Document Reviewed: 12/07/2015 Elsevier Patient Education  2020 Elsevier Inc.  

## 2019-03-06 LAB — CMP14+EGFR
ALT: 13 IU/L (ref 0–44)
AST: 13 IU/L (ref 0–40)
Albumin/Globulin Ratio: 1.6 (ref 1.2–2.2)
Albumin: 4.6 g/dL (ref 3.8–4.8)
Alkaline Phosphatase: 52 IU/L (ref 39–117)
BUN/Creatinine Ratio: 14 (ref 10–24)
BUN: 16 mg/dL (ref 8–27)
Bilirubin Total: 0.3 mg/dL (ref 0.0–1.2)
CO2: 21 mmol/L (ref 20–29)
Calcium: 9.8 mg/dL (ref 8.6–10.2)
Chloride: 100 mmol/L (ref 96–106)
Creatinine, Ser: 1.12 mg/dL (ref 0.76–1.27)
GFR calc Af Amer: 79 mL/min/{1.73_m2} (ref >59)
GFR calc non Af Amer: 68 mL/min/{1.73_m2} (ref >59)
Globulin, Total: 2.9 g/dL (ref 1.5–4.5)
Glucose: 102 mg/dL — ABNORMAL HIGH (ref 65–99)
Potassium: 4.8 mmol/L (ref 3.5–5.2)
Sodium: 138 mmol/L (ref 134–144)
Total Protein: 7.5 g/dL (ref 6.0–8.5)

## 2019-03-06 LAB — LIPID PANEL
Chol/HDL Ratio: 3 ratio (ref 0.0–5.0)
Cholesterol, Total: 89 mg/dL — ABNORMAL LOW (ref 100–199)
HDL: 30 mg/dL — ABNORMAL LOW (ref >39)
LDL Calculated: 39 mg/dL (ref 0–99)
Triglycerides: 98 mg/dL (ref 0–149)
VLDL Cholesterol Cal: 20 mg/dL (ref 5–40)

## 2019-03-06 LAB — HEMOGLOBIN A1C
Est. average glucose Bld gHb Est-mCnc: 137 mg/dL
Hgb A1c MFr Bld: 6.4 % — ABNORMAL HIGH (ref 4.8–5.6)

## 2019-03-07 ENCOUNTER — Encounter: Payer: Self-pay | Admitting: Internal Medicine

## 2019-03-11 ENCOUNTER — Telehealth: Payer: Self-pay

## 2019-03-12 ENCOUNTER — Ambulatory Visit: Payer: Self-pay | Admitting: Pharmacist

## 2019-03-12 DIAGNOSIS — E1122 Type 2 diabetes mellitus with diabetic chronic kidney disease: Secondary | ICD-10-CM

## 2019-03-12 DIAGNOSIS — I129 Hypertensive chronic kidney disease with stage 1 through stage 4 chronic kidney disease, or unspecified chronic kidney disease: Secondary | ICD-10-CM

## 2019-03-12 DIAGNOSIS — N182 Chronic kidney disease, stage 2 (mild): Secondary | ICD-10-CM

## 2019-03-12 NOTE — Progress Notes (Signed)
  Chronic Care Management   Outreach Note  03/12/2019 Name: Andres [name] MRN: [id number] DOB: 1951-05-03  Referred by: Glendale Chard, MD Reason for referral : Chronic Care Management   An unsuccessful telephone outreach was attempted today. The patient was referred to the case management team by for assistance with chronic care management and care coordination.   Follow Up Plan: A HIPPA compliant phone message was left for the patient providing contact information and requesting a return call.  The care management team will reach out to the patient again over the next 5-7 business days.    Regina Eck, PharmD, BCPS Clinical Pharmacist, [facility] [place]: [phone]

## 2019-03-15 ENCOUNTER — Other Ambulatory Visit: Payer: Self-pay | Admitting: Internal Medicine

## 2019-03-20 ENCOUNTER — Ambulatory Visit: Payer: Self-pay | Admitting: Pharmacist

## 2019-03-20 ENCOUNTER — Other Ambulatory Visit: Payer: Self-pay | Admitting: Internal Medicine

## 2019-03-20 ENCOUNTER — Telehealth: Payer: Self-pay

## 2019-03-20 DIAGNOSIS — N182 Chronic kidney disease, stage 2 (mild): Secondary | ICD-10-CM

## 2019-03-20 DIAGNOSIS — E7800 Pure hypercholesterolemia, unspecified: Secondary | ICD-10-CM

## 2019-03-20 DIAGNOSIS — E1122 Type 2 diabetes mellitus with diabetic chronic kidney disease: Secondary | ICD-10-CM

## 2019-03-22 ENCOUNTER — Telehealth: Payer: Self-pay

## 2019-03-22 NOTE — Patient Instructions (Signed)
Visit Information  Goals Addressed            This Visit's Progress     Patient Stated   . I would like to apply for financial assistance for my medications (pt-stated)       Current Barriers:  . Non Adherence to prescribed medication regimen and Financial Barriers  Pharmacist Clinical Goal(s):  [name] Over the next 45 days, patient will work with CCM PharmD to address needs related to applying for financial assistance for [name]  Interventions: . Comprehensive medication review performed. . Advised patient to continue taking all medications as prescribed. Reviewed medication purpose.  Patient denies adverse events.   . Patient states he cannot afford his [name] ($45 copay/month, however patient is now in coverage gap and copay is $110).  He is agreeable to participate in patient assistance program through [employer].  After further review, patient exceeds income requirements for [employer] Patient assistance program.  We will continue to obtain [employer] patient assistance approval for [name].  Encouraged patient to mail back information for [employer].  Explained [employer] program and how patient will have to stay engaged.  Prepared another application and left at front desk for patient to complete. . Will continue to follow   Patient Self Care Activities:  . Self administers medications as prescribed . Attends all scheduled provider appointments . Calls pharmacy for medication refills . Attends church or other social activities . Performs ADL's independently . Performs IADL's independently . Calls provider office for new concerns or questions  Please see past updates related to this goal by clicking on the "Past Updates" button in the selected goal      . I would like to optimize my medication management of my chronic conditions (pt-stated)       Current Barriers:  [name] Knowledge Deficits related to disease state managment and Non Adherence to prescribed medication regimen  Pharmacist Clinical Goal(s):   [name] Over the next 90 days, patient will work with PharmD to address needs related to optimized  medication management of chronic conditions  Interventions: . Comprehensive medication review performed.  Reviewed medication fill history via insurance claims data confirming patient appears compliant with having his medications filled on time as prescribed by provider. . Reviewed & discussed the following diabetes-related information with patient: o Continue checking blood sugars as directed o Follow ADA recommended "diabetes-friendly" diet  (reviewed healthy snack/food options) o Encouraged patient to exercise as able and as recommended by PCP. o Confirmed current DM regimen: [name] 50-1000 -->1 tablet by mouth twice daily o Patient uses Accu Check Guide glucometer o Reviewed ALL medication purposes/side effects-->patient denies adverse events, denies hypoglycemia, reports FBG 90s-110s.  Encouraged patient to continue taking all medications as prescribed. o Most recent A1c is 6.4 on 03/05/19.  Most recent Scr was 1.12 (baseline). o Antihyperlipidemia regimen: rosuvastatin 10mg  daily. o Antihypertensive regimen: olmesartan/HCTZ 20mg /12.5mg  daily o Continue taking all medications as prescribed by provider   Patient Self Care Activities:  . Self administers medications as prescribed . Attends all scheduled provider appointments . Calls pharmacy for medication refills . Performs ADL's independently . Performs IADL's independently  Please see past updates related to this goal by clicking on the "Past Updates" button in the selected goal         The patient verbalized understanding of instructions provided today and declined a print copy of patient instruction materials.   The care management team will reach out to the patient again over the next 4 WEEKS.  Regina Eck, PharmD, BCPS Clinical Pharmacist, [facility] [place]: [phone]

## 2019-03-22 NOTE — Progress Notes (Signed)
Chronic Care Management   Visit Note  03/20/2019 Name: Andres [name] MRN: [id number] DOB: 1950/10/03  Referred by: Andres Chard, MD Reason for referral : Chronic Care Management   Andres [name] is a 68 y.o. year old male who is a primary care patient of Andres Chard, MD. The CCM team was consulted for assistance with chronic disease management and care coordination needs.   Review of patient status, including review of consultants reports, relevant laboratory and other test results, and collaboration with appropriate care team members and the patient's provider was performed as part of comprehensive patient evaluation and provision of chronic care management services.    I spoke with Mr. Andres [name] by telephone today.  Medications: Outpatient Encounter Medications as of 03/20/2019  Medication Sig  . ACCU-CHEK FASTCLIX LANCETS MISC USE TO CHECK BLOOD SUGAR TID BEFORE MEALS  . ACCU-CHEK GUIDE test strip USE TO CHECK BLOOD SUGAR TID BEFORE MEALS  . Arginine 500 MG CAPS Take by mouth.  [name] aspirin 81 MG tablet Take 81 mg by mouth daily.  . cholecalciferol (VITAMIN D) 1000 UNITS tablet Take 1,000 Units by mouth daily.  . Cyanocobalamin (VITAMIN B 12 PO) Take 1 capsule by mouth.  . fluticasone (CUTIVATE) 0.05 % cream Apply topically 2 (two) times daily.  [name] levocetirizine (XYZAL) 5 MG tablet TAKE 1 TABLET BY MOUTH EVERY EVENING.  . Multiple Vitamin (MULTIVITAMIN) tablet Take 1 tablet by mouth daily.  [name] olmesartan-hydrochlorothiazide (BENICAR HCT) 20-12.5 MG tablet TAKE 1 TABLET BY MOUTH ONCE DAILY.  . rosuvastatin (CRESTOR) 10 MG tablet TAKE (1) TABLET BY MOUTH DAILY.  . tadalafil (CIALIS) 5 MG tablet Take 1 tablet (5 mg total) by mouth daily.  . [DISCONTINUED] [name] 50-1000 MG tablet TAKE 1 TABLET BY MOUTH TWICE DAILY WITH A MEAL.   No facility-administered encounter medications on file as of 03/20/2019.      Objective:   Goals Addressed            This Visit's Progress     Patient Stated   . I would like to apply for financial assistance for my medications (pt-stated)       Current Barriers:  . Non Adherence to prescribed medication regimen and Financial Barriers  Pharmacist Clinical Goal(s):  [name] Over the next 45 days, patient will work with CCM PharmD to address needs related to applying for financial assistance for [name]  Interventions: . Comprehensive medication review performed. . Advised patient to continue taking all medications as prescribed. Reviewed medication purpose.  Patient denies adverse events.   . Patient states he cannot afford his [name] ($45 copay/month, however patient is now in coverage gap and copay is $110).  He is agreeable to participate in patient assistance program through [employer].  After further review, patient exceeds income requirements for [employer] Patient assistance program.  We will continue to obtain [employer] patient assistance approval for [name].  Encouraged patient to mail back information for [employer].  Explained [employer] program and how patient will have to stay engaged.  Prepared another application and left at front desk for patient to complete. . Will continue to follow   Patient Self Care Activities:  . Self administers medications as prescribed . Attends all scheduled provider appointments . Calls pharmacy for medication refills . Attends church or other social activities . Performs ADL's independently . Performs IADL's independently . Calls provider office for new concerns or questions  Please see past updates related to this goal by clicking on the "Past Updates" button in  the selected goal      . I would like to optimize my medication management of my chronic conditions (pt-stated)       Current Barriers:  [name] Knowledge Deficits related to disease state managment and Non Adherence to prescribed medication regimen  Pharmacist Clinical Goal(s):  [name] Over the next 90 days, patient will work with PharmD to address needs  related to optimized  medication management of chronic conditions  Interventions: . Comprehensive medication review performed.  Reviewed medication fill history via insurance claims data confirming patient appears compliant with having his medications filled on time as prescribed by provider. . Reviewed & discussed the following diabetes-related information with patient: o Continue checking blood sugars as directed o Follow ADA recommended "diabetes-friendly" diet  (reviewed healthy snack/food options) o Encouraged patient to exercise as able and as recommended by PCP. o Confirmed current DM regimen: [name] 50-1000 -->1 tablet by mouth twice daily o Patient uses Accu Check Guide glucometer o Reviewed ALL medication purposes/side effects-->patient denies adverse events, denies hypoglycemia, reports FBG 90s-110s.  Encouraged patient to continue taking all medications as prescribed. o Most recent A1c is 6.4 on 03/05/19.  Most recent Scr was 1.12 (baseline). o Antihyperlipidemia regimen: rosuvastatin 10mg  daily. o Antihypertensive regimen: olmesartan/HCTZ 20mg /12.5mg  daily o Continue taking all medications as prescribed by provider   Patient Self Care Activities:  . Self administers medications as prescribed . Attends all scheduled provider appointments . Calls pharmacy for medication refills . Performs ADL's independently . Performs IADL's independently  Please see past updates related to this goal by clicking on the "Past Updates" button in the selected goal          Plan:   The care management team will reach out to the patient again over the next 4 weeks.  Regina Eck, PharmD, BCPS Clinical Pharmacist, [facility] [place]: [phone]

## 2019-03-28 ENCOUNTER — Other Ambulatory Visit: Payer: Self-pay | Admitting: Pharmacy Technician

## 2019-03-28 NOTE — Patient Outreach (Signed)
[facility]) Care Management  03/28/2019  ASAI VANTREASE May 22, 1951 [id number]   Informed by [employer] [facility] RPh Jenne Pane that patient had brought his portion of [employer] application for [employer]. Almyra Free mailed out patient and provider portion of application to [employer].  Will follow up with [employer] in 10-14 business days to check application status.  [name] Chana Bode [place] Certified Pharmacy Technician [place] Management Direct Dial:[phone]

## 2019-04-01 ENCOUNTER — Ambulatory Visit (INDEPENDENT_AMBULATORY_CARE_PROVIDER_SITE_OTHER): Payer: Medicare HMO

## 2019-04-01 ENCOUNTER — Other Ambulatory Visit: Payer: Self-pay

## 2019-04-01 VITALS — BP 118/76 | HR 64 | Temp 98.6°F | Ht 70.8 in | Wt 226.0 lb

## 2019-04-01 DIAGNOSIS — Z23 Encounter for immunization: Secondary | ICD-10-CM | POA: Diagnosis not present

## 2019-04-01 NOTE — Progress Notes (Signed)
Pt presented today for a flu shot

## 2019-04-08 ENCOUNTER — Ambulatory Visit (INDEPENDENT_AMBULATORY_CARE_PROVIDER_SITE_OTHER): Payer: Medicare HMO | Admitting: Pharmacist

## 2019-04-08 DIAGNOSIS — E7800 Pure hypercholesterolemia, unspecified: Secondary | ICD-10-CM | POA: Diagnosis not present

## 2019-04-08 DIAGNOSIS — N182 Chronic kidney disease, stage 2 (mild): Secondary | ICD-10-CM

## 2019-04-08 DIAGNOSIS — Z794 Long term (current) use of insulin: Secondary | ICD-10-CM

## 2019-04-08 DIAGNOSIS — E1122 Type 2 diabetes mellitus with diabetic chronic kidney disease: Secondary | ICD-10-CM | POA: Diagnosis not present

## 2019-04-08 NOTE — Progress Notes (Signed)
Chronic Care Management   Visit Note  04/08/2019 Name: Andres [name] MRN: [id number] DOB: [birth year]  Referred by: Andres Chard, MD Reason for referral : Chronic Care Management   Andres [name] is a 68 y.o. year old male who is a primary care patient of Andres Chard, MD. The CCM team was consulted for assistance with chronic disease management and care coordination needs.   Review of patient status, including review of consultants reports, relevant laboratory and other test results, and collaboration with appropriate care team members and the patient's provider was performed as part of comprehensive patient evaluation and provision of chronic care management services.    I spoke with Andres [name] by telephone today.  Advanced Directives Status: N See Care Plan and [employer] application for related entries.   Medications: Outpatient Encounter Medications as of 04/08/2019  Medication Sig  . ACCU-CHEK FASTCLIX LANCETS MISC USE TO CHECK BLOOD SUGAR TID BEFORE MEALS  . ACCU-CHEK GUIDE test strip USE TO CHECK BLOOD SUGAR TID BEFORE MEALS  . Arginine 500 MG CAPS Take by mouth.  [name] aspirin 81 MG tablet Take 81 mg by mouth daily.  . cholecalciferol (VITAMIN D) 1000 UNITS tablet Take 1,000 Units by mouth daily.  . Cyanocobalamin (VITAMIN B 12 PO) Take 1 capsule by mouth.  . fluticasone (CUTIVATE) 0.05 % cream Apply topically 2 (two) times daily.  [name] [name] 50-1000 MG tablet TAKE 1 TABLET BY MOUTH TWICE DAILY WITH A MEAL.  [name] levocetirizine (XYZAL) 5 MG tablet TAKE 1 TABLET BY MOUTH EVERY EVENING.  . Multiple Vitamin (MULTIVITAMIN) tablet Take 1 tablet by mouth daily.  [name] olmesartan-hydrochlorothiazide (BENICAR HCT) 20-12.5 MG tablet TAKE 1 TABLET BY MOUTH ONCE DAILY.  . rosuvastatin (CRESTOR) 10 MG tablet TAKE (1) TABLET BY MOUTH DAILY.  . tadalafil (CIALIS) 5 MG tablet Take 1 tablet (5 mg total) by mouth daily.   No facility-administered encounter medications on file as of 04/08/2019.       Objective:   Goals Addressed            This Visit's Progress     Patient Stated   . I would like to apply for financial assistance for my medications (pt-stated)       Current Barriers:  . Non Adherence to prescribed medication regimen and Financial Barriers  Pharmacist Clinical Goal(s):  [name] Over the next 45 days, patient will work with CCM PharmD to address needs related to applying for financial assistance for [name]  Interventions: . Comprehensive medication review performed. . Advised patient to continue taking all medications as prescribed. Reviewed medication purpose.  Patient denies adverse events.   . Patient states he cannot afford his [name] ($45 copay/month, however patient is now in coverage gap and copay is $110).  He is agreeable to participate in patient assistance program through [employer].  After further review, patient exceeds income requirements for [employer] Patient assistance program.  We will continue to obtain [employer] patient assistance approval for [name].  Patient came to clinic to fill out paperwork for [employer] [facility]) Patient assistance program.  Andres [name], Andres [name] following. Explained [employer] program and how patient will have to stay engaged for letter of attestation.  Patient verbalizes understanding.  Application reviewed and mailed. . Will continue to follow   Patient Self Care Activities:  . Self administers medications as prescribed . Attends all scheduled provider appointments . Calls pharmacy for medication refills . Attends church or other social activities . Performs ADL's independently . Performs IADL's independently .  Calls provider office for new concerns or questions  Please see past updates related to this goal by clicking on the "Past Updates" button in the selected goal      . I would like to optimize my medication management of my chronic conditions (pt-stated)       Current Barriers:  [name] Knowledge Deficits related to disease state  managment and Non Adherence to prescribed medication regimen  Pharmacist Clinical Goal(s):  [name] Over the next 90 days, patient will work with PharmD to address needs related to optimized  medication management of chronic conditions  Interventions: . Comprehensive medication review performed.  Reviewed medication fill history via insurance claims data confirming patient appears compliant with having his medications filled on time as prescribed by provider. . Reviewed & discussed the following diabetes-related information with patient: o Continue checking blood sugars as directed o Follow ADA recommended "diabetes-friendly" diet  (reviewed healthy snack/food options) o Encouraged patient to exercise as able and as recommended by PCP. o Confirmed current DM regimen: [name] 50-1000 -->1 tablet by mouth twice daily o Patient uses Accu Check Guide glucometer.  He checks his blood sugar each morning o Reviewed ALL medication purposes/side effects-->patient denies adverse events, denies hypoglycemia, reports FBG 90s-110s.  Encouraged patient to continue taking all medications as prescribed. o Most recent A1c is 6.4 on 03/05/19 (was 6.3 on 10/30/18).  Most recent Scr was 1.12 (baseline). o Antihyperlipidemia regimen: rosuvastatin 10mg  daily o Antihypertensive regimen: olmesartan/HCTZ 20mg /12.5mg  daily o Continue taking all medications as prescribed by provider   Patient Self Care Activities:  . Self administers medications as prescribed . Attends all scheduled provider appointments . Calls pharmacy for medication refills . Performs ADL's independently . Performs IADL's independently  Please see past updates related to this goal by clicking on the "Past Updates" button in the selected goal         Plan:   The care management team will reach out to the patient again over the next 6 weeks.  Andres [name], PharmD, BCPS Clinical Pharmacist, [facility] [place]: [phone]

## 2019-04-08 NOTE — Patient Instructions (Signed)
Visit Information  Goals Addressed            This Visit's Progress     Patient Stated   . I would like to apply for financial assistance for my medications (pt-stated)       Current Barriers:  . Non Adherence to prescribed medication regimen and Financial Barriers  Pharmacist Clinical Goal(s):  [name] Over the next 45 days, patient will work with CCM PharmD to address needs related to applying for financial assistance for [name]  Interventions: . Comprehensive medication review performed. . Advised patient to continue taking all medications as prescribed. Reviewed medication purpose.  Patient denies adverse events.   . Patient states he cannot afford his [name] ($45 copay/month, however patient is now in coverage gap and copay is $110).  He is agreeable to participate in patient assistance program through [employer].  After further review, patient exceeds income requirements for [employer] Patient assistance program.  We will continue to obtain [employer] patient assistance approval for [name].  Patient came to clinic to fill out paperwork for Merck [facility]) Patient assistance program.  [facility] CPhT, Etter Sjogren following. Explained [employer]ck program and how patient will have to stay engaged for letter of attestation.  Patient verbalizes understanding.  Application reviewed and mailed. . Will continue to follow   Patient Self Care Activities:  . Self administers medications as prescribed . Attends all scheduled provider appointments . Calls pharmacy for medication refills . Attends church or other social activities . Performs ADL's independently . Performs IADL's independently . Calls provider office for new concerns or questions  Please see past updates related to this goal by clicking on the "Past Updates" button in the selected goal      . I would like to optimize my medication management of my chronic conditions (pt-stated)       Current Barriers:  [name] Knowledge Deficits related to disease state  managment and Non Adherence to prescribed medication regimen  Pharmacist Clinical Goal(s):  [name] Over the next 90 days, patient will work with PharmD to address needs related to optimized  medication management of chronic conditions  Interventions: . Comprehensive medication review performed.  Reviewed medication fill history via insurance claims data confirming patient appears compliant with having his medications filled on time as prescribed by provider. . Reviewed & discussed the following diabetes-related information with patient: o Continue checking blood sugars as directed o Follow ADA recommended "diabetes-friendly" diet  (reviewed healthy snack/food options) o Encouraged patient to exercise as able and as recommended by PCP. o Confirmed current DM regimen: [name] 50-1000 -->1 tablet by mouth twice daily o Patient uses Accu Check Guide glucometer.  He checks his blood sugar each morning o Reviewed ALL medication purposes/side effects-->patient denies adverse events, denies hypoglycemia, reports FBG 90s-110s.  Encouraged patient to continue taking all medications as prescribed. o Most recent A1c is 6.4 on 03/05/19 (was 6.3 on 10/30/18).  Most recent Scr was 1.12 (baseline). o Antihyperlipidemia regimen: rosuvastatin 10mg  daily o Antihypertensive regimen: olmesartan/HCTZ 20mg /12.5mg  daily o Continue taking all medications as prescribed by provider   Patient Self Care Activities:  . Self administers medications as prescribed . Attends all scheduled provider appointments . Calls pharmacy for medication refills . Performs ADL's independently . Performs IADL's independently  Please see past updates related to this goal by clicking on the "Past Updates" button in the selected goal         The patient verbalized understanding of instructions provided today and declined a print copy  of patient instruction materials.   The care management team will reach out to the patient again over the  next 6 weeks   Regina Eck, PharmD, [place] Pharmacist, [place]: [phone]

## 2019-04-16 ENCOUNTER — Other Ambulatory Visit: Payer: Self-pay | Admitting: Pharmacy Technician

## 2019-04-16 NOTE — Patient Outreach (Signed)
[facility]) Care Management  04/16/2019  Andres [name] 10-31-1950 [id number]    Follow up call placed to [employer] regarding patient assistance application(s) for [name] , [name] states that patient has been denied due to being over income for the program.   Follow up:  Will route note to [place] to  inform  [name]. Chana Bode [place] Certified Pharmacy Technician [place] Management Direct Dial:[phone]

## 2019-04-17 ENCOUNTER — Ambulatory Visit: Payer: Self-pay | Admitting: Pharmacist

## 2019-04-17 DIAGNOSIS — E7800 Pure hypercholesterolemia, unspecified: Secondary | ICD-10-CM

## 2019-04-17 DIAGNOSIS — N182 Chronic kidney disease, stage 2 (mild): Secondary | ICD-10-CM

## 2019-04-17 DIAGNOSIS — E1122 Type 2 diabetes mellitus with diabetic chronic kidney disease: Secondary | ICD-10-CM

## 2019-04-18 ENCOUNTER — Ambulatory Visit: Payer: Self-pay

## 2019-04-18 ENCOUNTER — Other Ambulatory Visit: Payer: Self-pay

## 2019-04-18 ENCOUNTER — Other Ambulatory Visit: Payer: Self-pay | Admitting: Internal Medicine

## 2019-04-18 ENCOUNTER — Telehealth: Payer: Self-pay

## 2019-04-18 ENCOUNTER — Encounter: Payer: Self-pay | Admitting: Internal Medicine

## 2019-04-18 DIAGNOSIS — E1122 Type 2 diabetes mellitus with diabetic chronic kidney disease: Secondary | ICD-10-CM

## 2019-04-18 DIAGNOSIS — N182 Chronic kidney disease, stage 2 (mild): Secondary | ICD-10-CM

## 2019-04-18 DIAGNOSIS — E7800 Pure hypercholesterolemia, unspecified: Secondary | ICD-10-CM

## 2019-04-18 DIAGNOSIS — I129 Hypertensive chronic kidney disease with stage 1 through stage 4 chronic kidney disease, or unspecified chronic kidney disease: Secondary | ICD-10-CM

## 2019-04-18 NOTE — Progress Notes (Signed)
  Chronic Care Management   Outreach Note  04/17/2019 Name: Andres [name] MRN: [id number] DOB: 10-15-1950  Referred by: [place] Chard, MD Reason for referral : Chronic Care Management   An unsuccessful telephone outreach was attempted today. The patient was referred to the case management team by for assistance with care management and care coordination.   Follow Up Plan: The care management team will reach out to the patient again over the next 5-7 business days.   Regina Eck, PharmD, BCPS Clinical Pharmacist, [facility] [place]: [phone]

## 2019-04-19 NOTE — Patient Instructions (Signed)
Visit Information  Goals Addressed      Patient Stated   . "I am going to start walking again" (pt-stated)       Current Barriers:  [name] Knowledge Deficits related to diabetes disease management and Self health management   Nurse Case Manager Clinical Goal(s):  [name] Over the next 30 days, patient will verbalize basic understanding of diabetes disease process and self health management plan as evidenced by patient will verbalize increased knowledge and understanding of meal planning using the plate method and portion control  Goal Met . Over the next 30 days, patient will have a routine exercise regimen in place to include daily walking for 10 min or more per day. Goal Met . 04/18/19 Over the next 30 days patient will verbalize better understanding about how walking and or exercising routinely can help lower his A1C and improve his overall stamina  CCM RN CM Interventions: 04/18/19 completed call with patient   . Evaluation of current treatment plan related to diabetes and patient's adherence to plan as established by provider. . Provided education to patient re: current A1C of 6.4 obtained on 03/05/19; reviewed target A1C of less than 5.6% to be considered non-diabetic; reiterated how implementing daily exercise can help lower blood sugars; discussed ADA recommendations to exercise 150 minutes per week; discussed patient can start off at less and work his way up depending on his preference and toleration; discussed walking is a good form of exercise; patient confirmed he is walking "about everyday" . Reviewed medications with patient and discussed indication, usage and frequency; discussed patient is having some financial difficulty with med cost; discussed he was denied for financial assistance with [name] due to exceeding the income limit; patient is receiving samples from the office at this time and [name] will continue to follow/assist  . Discussed plans with patient for ongoing care  management follow up and provided patient with direct contact information for care management team . Provided patient with printed educational materials related to "Healthy Weigh; Senior Exercise and Fitness" . Advised patient, providing education and rationale, to check cbg daily before meals and record, calling the CCM team and or PCP office for findings outside established parameters.  <80 and or >250  Patient Self Care Activities:  . Self administers medications as prescribed . Attends all scheduled provider appointments . Calls pharmacy for medication refills . Performs ADL's independently . Performs IADL's independently . Calls provider office for new concerns or questions  Please see past updates related to this goal by clicking on the "Past Updates" button in the selected goal         The patient verbalized understanding of instructions provided today and declined a print copy of patient instruction materials.   Telephone follow up appointment with care management team member scheduled for: 05/17/19  Barb Merino, RN, BSN, CCM Care Management Coordinator [place] [facility]  Direct Phone: [phone]

## 2019-04-19 NOTE — Chronic Care Management (AMB) (Signed)
Chronic Care Management   Follow Up Note   04/18/2019 Name: Andres [name] MRN: [id number] DOB: April 08, 1951  Referred by: Glendale Chard, MD Reason for referral : Chronic Care Management (CCM RNCM Telephone Follow up )   ANTAEUS [name] is a 68 y.o. year old male who is a primary care patient of Glendale Chard, MD. The CCM team was consulted for assistance with chronic disease management and care coordination needs.    Review of patient status, including review of consultants reports, relevant laboratory and other test results, and collaboration with appropriate care team members and the patient's provider was performed as part of comprehensive patient evaluation and provision of chronic care management services.    SDOH (Social Determinants of Health) screening performed today: None. See Care Plan for related entries.   Advanced Directives Status: N See Care Plan and Vynca application for related entries.  I spoke with Andres [name] by telephone today for a CCM follow up, Andres [name] states he is driving and needs to keep the call brief.   Outpatient Encounter Medications as of 04/18/2019  Medication Sig  . ACCU-CHEK FASTCLIX LANCETS MISC USE TO CHECK BLOOD SUGAR TID BEFORE MEALS  . ACCU-CHEK GUIDE test strip USE TO CHECK BLOOD SUGAR TID BEFORE MEALS  . Arginine 500 MG CAPS Take by mouth.  [name] aspirin 81 MG tablet Take 81 mg by mouth daily.  . cholecalciferol (VITAMIN [name]) 1000 UNITS tablet Take 1,000 Units by mouth daily.  . Cyanocobalamin (VITAMIN B 12 PO) Take 1 capsule by mouth.  . fluticasone (CUTIVATE) 0.05 % cream Apply topically 2 (two) times daily.  [name] [name] 50-1000 MG tablet TAKE 1 TABLET BY MOUTH TWICE DAILY WITH A MEAL.  [name] levocetirizine (XYZAL) 5 MG tablet TAKE 1 TABLET BY MOUTH EVERY EVENING.  . Multiple Vitamin (MULTIVITAMIN) tablet Take 1 tablet by mouth daily.  [name] olmesartan-hydrochlorothiazide (BENICAR HCT) 20-12.5 MG tablet TAKE 1 TABLET BY MOUTH ONCE DAILY.  .  rosuvastatin (CRESTOR) 10 MG tablet TAKE (1) TABLET BY MOUTH DAILY.  . tadalafil (CIALIS) 5 MG tablet Take 1 tablet (5 mg total) by mouth daily.   No facility-administered encounter medications on file as of 04/18/2019.      Goals Addressed      Patient Stated   . "I am going to start walking again" (pt-stated)       Current Barriers:  [name] Knowledge Deficits related to diabetes disease management and Self health management   Nurse Case Manager Clinical Goal(s):  [name] Over the next 30 days, patient will verbalize basic understanding of diabetes disease process and self health management plan as evidenced by patient will verbalize increased knowledge and understanding of meal planning using the plate method and portion control  Goal Met . Over the next 30 days, patient will have a routine exercise regimen in place to include daily walking for 10 min or more per day. Goal Met . 04/18/19 Over the next 30 days patient will verbalize better understanding about how walking and or exercising routinely can help lower his A1C and improve his overall stamina  CCM RN CM Interventions: 04/18/19 completed call with patient   . Evaluation of current treatment plan related to diabetes and patient's adherence to plan as established by provider. . Provided education to patient re: current A1C of 6.4 obtained on 03/05/19; reviewed target A1C of less than 5.6% to be considered non-diabetic; reiterated how implementing daily exercise can help lower blood sugars; discussed ADA recommendations to exercise 150  minutes per week; discussed patient can start off at less and work his way up depending on his preference and toleration; discussed walking is a good form of exercise; patient confirmed he is walking "about everyday" . Reviewed medications with patient and discussed indication, usage and frequency; discussed patient is having some financial difficulty with med cost; discussed he was denied for financial assistance  with [name] due to exceeding the income limit; patient is receiving samples from the office at this time and Andres [name] will continue to follow/assist  . Discussed plans with patient for ongoing care management follow up and provided patient with direct contact information for care management team . Provided patient with printed educational materials related to "Healthy Weigh; Senior Exercise and Fitness" . Advised patient, providing education and rationale, to check cbg daily before meals and record, calling the CCM team and or PCP office for findings outside established parameters.  <80 and or >250  Patient Self Care Activities:  . Self administers medications as prescribed . Attends all scheduled provider appointments . Calls pharmacy for medication refills . Performs ADL's independently . Performs IADL's independently . Calls provider office for new concerns or questions  Please see past updates related to this goal by clicking on the "Past Updates" button in the selected goal          Telephone follow up appointment with care management team member scheduled for: 05/17/19   Barb Merino, RN, BSN, CCM Care Management Coordinator [place] [facility]  Direct Phone: [phone]

## 2019-04-29 ENCOUNTER — Telehealth: Payer: Self-pay

## 2019-05-06 ENCOUNTER — Ambulatory Visit: Payer: Self-pay | Admitting: Pharmacist

## 2019-05-06 ENCOUNTER — Telehealth: Payer: Self-pay

## 2019-05-06 DIAGNOSIS — Z794 Long term (current) use of insulin: Secondary | ICD-10-CM

## 2019-05-06 DIAGNOSIS — E1122 Type 2 diabetes mellitus with diabetic chronic kidney disease: Secondary | ICD-10-CM

## 2019-05-06 DIAGNOSIS — N182 Chronic kidney disease, stage 2 (mild): Secondary | ICD-10-CM

## 2019-05-06 NOTE — Progress Notes (Signed)
  Chronic Care Management   Outreach Note  05/06/2019 Name: Andres [name] MRN: [id number] DOB: 02-10-1951  Referred by: Glendale Chard, MD Reason for referral : Chronic Care Management   An unsuccessful telephone outreach was attempted today. The patient was referred to the case management team by for assistance with care management and care coordination.   Follow Up Plan: A HIPPA compliant phone message was left for the patient providing contact information and requesting a return call.  The care management team will reach out to the patient again over the next 7-10 business days.   SIGNATURE Regina Eck, PharmD, BCPS Clinical Pharmacist, [facility] [place]: [phone]

## 2019-05-08 ENCOUNTER — Ambulatory Visit: Payer: Self-pay | Admitting: Pharmacist

## 2019-05-08 DIAGNOSIS — E1122 Type 2 diabetes mellitus with diabetic chronic kidney disease: Secondary | ICD-10-CM

## 2019-05-08 DIAGNOSIS — N182 Chronic kidney disease, stage 2 (mild): Secondary | ICD-10-CM

## 2019-05-09 NOTE — Patient Instructions (Signed)
Visit Information  Goals Addressed            This Visit's Progress     Patient Stated   . I would like to apply for financial assistance for my medications (pt-stated)       Current Barriers:  . Non Adherence to prescribed medication regimen and Financial Barriers  Pharmacist Clinical Goal(s):  [name] Over the next 45 days, patient will work with CCM PharmD to address needs related to applying for financial assistance for [name]  Interventions: . Comprehensive medication review performed. . Advised patient to continue taking all medications as prescribed. Reviewed medication purpose.  Patient denies adverse events.   . Patient states he cannot afford his [name] ($45 copay/month, however patient is now in coverage gap and copay is $110).  He is agreeable to participate in patient assistance program through [employer].  After further review, patient exceeds income requirements for [employer] Patient assistance program.  We will continue to obtain [employer] patient assistance approval for [name].  Patient came to clinic to fill out paperwork for [employer] [facility] Patient assistance program-pending approval.  [facility] CPhT, Etter Sjogren following.  Patient states he has not received the letter or attestation from the drug company.  Will continue to provide samples as able and apply for assistance Kandis Fantasia) on Dec 15th, 2020 for the next calendar year 2021.  Patient verbalizes understanding . Will continue to follow   Patient Self Care Activities:  . Self administers medications as prescribed . Attends all scheduled provider appointments . Calls pharmacy for medication refills . Attends church or other social activities . Performs ADL's independently . Performs IADL's independently . Calls provider office for new concerns or questions  Please see past updates related to this goal by clicking on the "Past Updates" button in the selected goal         The patient verbalized understanding of  instructions provided today and declined a print copy of patient instruction materials.   The care management team will reach out to the patient again over the next 30 days.   SIGNATURE Regina Eck, PharmD, BCPS Clinical Pharmacist, [facility] [place]: [phone]

## 2019-05-09 NOTE — Progress Notes (Signed)
Chronic Care Management   Visit Note  04/12/2019 Name: Andres [name] MRN: [id number] DOB: 1951-04-19  Referred by: Glendale Chard, MD Reason for referral : Chronic Care Management   Andres [name] is a 68 y.o. year old male who is a primary care patient of Glendale Chard, MD. The CCM team was consulted for assistance with chronic disease management and care coordination needs related to DMII  Review of patient status, including review of consultants reports, relevant laboratory and other test results, and collaboration with appropriate care team members and the patient's provider was performed as part of comprehensive patient evaluation and provision of chronic care management services.    I spoke with Andres [name] by telephone today.  Advanced Directives Status: N See Care Plan and Vynca application for related entries.   Medications: Outpatient Encounter Medications as of 05/08/2019  Medication Sig  . ACCU-CHEK FASTCLIX LANCETS MISC USE TO CHECK BLOOD SUGAR TID BEFORE MEALS  . ACCU-CHEK GUIDE test strip USE TO CHECK BLOOD SUGAR TID BEFORE MEALS  . Arginine 500 MG CAPS Take by mouth.  [name] aspirin 81 MG tablet Take 81 mg by mouth daily.  . cholecalciferol (VITAMIN D) 1000 UNITS tablet Take 1,000 Units by mouth daily.  . Cyanocobalamin (VITAMIN B 12 PO) Take 1 capsule by mouth.  . fluticasone (CUTIVATE) 0.05 % cream Apply topically 2 (two) times daily.  [name] [name] 50-1000 MG tablet TAKE 1 TABLET BY MOUTH TWICE DAILY WITH A MEAL.  [name] levocetirizine (XYZAL) 5 MG tablet TAKE 1 TABLET BY MOUTH EVERY EVENING.  . Multiple Vitamin (MULTIVITAMIN) tablet Take 1 tablet by mouth daily.  [name] olmesartan-hydrochlorothiazide (BENICAR HCT) 20-12.5 MG tablet TAKE 1 TABLET BY MOUTH ONCE DAILY.  . rosuvastatin (CRESTOR) 10 MG tablet TAKE (1) TABLET BY MOUTH DAILY.  . tadalafil (CIALIS) 5 MG tablet Take 1 tablet (5 mg total) by mouth daily.   No facility-administered encounter medications on file as of  05/08/2019.      Objective:   Goals Addressed            This Visit's Progress     Patient Stated   . I would like to apply for financial assistance for my medications (pt-stated)       Current Barriers:  . Non Adherence to prescribed medication regimen and Financial Barriers  Pharmacist Clinical Goal(s):  [name] Over the next 45 days, patient will work with CCM PharmD to address needs related to applying for financial assistance for [name]  Interventions: . Comprehensive medication review performed. . Advised patient to continue taking all medications as prescribed. Reviewed medication purpose.  Patient denies adverse events.   . Patient states he cannot afford his [name] ($45 copay/month, however patient is now in coverage gap and copay is $110).  He is agreeable to participate in patient assistance program through [employer].  After further review, patient exceeds income requirements for [employer] Patient assistance program.  We will continue to obtain [employer] patient assistance approval for [name].  Patient came to clinic to fill out paperwork for [employer] [facility]) Patient assistance program-pending approval.  [facility] CPhT, Etter Sjogren following.  Patient states he has not received the letter or attestation from the drug company.  Will continue to provide samples as able and apply for assistance Kandis Fantasia) on Dec 15th, 2020 for the next calendar year 2021.  Patient verbalizes understanding . Will continue to follow   Patient Self Care Activities:  . Self administers medications as prescribed . Attends all scheduled provider appointments .  Calls pharmacy for medication refills . Attends church or other social activities . Performs ADL's independently . Performs IADL's independently . Calls provider office for new concerns or questions  Please see past updates related to this goal by clicking on the "Past Updates" button in the selected goal         Plan:   The care management team  will reach out to the patient again over the next 30 days.   Provider Signature Regina Eck, PharmD, BCPS Clinical Pharmacist, [facility] [place]: [phone]

## 2019-05-17 ENCOUNTER — Telehealth: Payer: Self-pay

## 2019-05-20 ENCOUNTER — Telehealth: Payer: Self-pay

## 2019-05-31 ENCOUNTER — Telehealth: Payer: Self-pay

## 2019-06-04 ENCOUNTER — Ambulatory Visit (INDEPENDENT_AMBULATORY_CARE_PROVIDER_SITE_OTHER): Payer: Medicare HMO | Admitting: Pharmacist

## 2019-06-04 DIAGNOSIS — N182 Chronic kidney disease, stage 2 (mild): Secondary | ICD-10-CM | POA: Diagnosis not present

## 2019-06-04 DIAGNOSIS — E1122 Type 2 diabetes mellitus with diabetic chronic kidney disease: Secondary | ICD-10-CM | POA: Diagnosis not present

## 2019-06-04 DIAGNOSIS — Z794 Long term (current) use of insulin: Secondary | ICD-10-CM | POA: Diagnosis not present

## 2019-06-04 NOTE — Progress Notes (Signed)
  Chronic Care Management   Outreach Note  06/04/2019 Name: Andres [name] MRN: [id number] DOB: 1950/09/06  Referred by: Glendale Chard, MD Reason for referral : Chronic Care Management   An unsuccessful telephone outreach was attempted today. The patient was referred to the case management team by for assistance with care management and care coordination.   Follow Up Plan: A HIPPA compliant phone message was left for the patient providing contact information and requesting a return call.  The care management team will reach out to the patient again over the next 7 days.   SIGNATURE Regina Eck, PharmD, BCPS Clinical Pharmacist, [facility] [place]: [phone]

## 2019-06-12 ENCOUNTER — Ambulatory Visit: Payer: Self-pay | Admitting: Pharmacist

## 2019-06-12 DIAGNOSIS — N182 Chronic kidney disease, stage 2 (mild): Secondary | ICD-10-CM

## 2019-06-12 DIAGNOSIS — E1122 Type 2 diabetes mellitus with diabetic chronic kidney disease: Secondary | ICD-10-CM

## 2019-06-12 NOTE — Patient Instructions (Signed)
Visit Information  Goals Addressed            This Visit's Progress     Patient Stated   . I would like to apply for financial assistance for my medications (pt-stated)       Current Barriers:  . Non Adherence to prescribed medication regimen and Financial Barriers  Pharmacist Clinical Goal(s):  [name] Over the next 45 days, patient will work with CCM PharmD to address needs related to applying for financial assistance for [name]  Interventions: . Comprehensive medication review performed. . Advised patient to continue taking all medications as prescribed. Reviewed medication purpose.  Patient denies adverse events.   . Patient states he cannot afford his [name] ($45 copay/month, however patient is now in coverage gap and copay is $110).  He is agreeable to participate in patient assistance program through [employer]. Will prepare documents and leave at front PCP office for patient signature.  We will submit this for the year 2021 . [facility] CPhT, Etter Sjogren following.   . Will continue to provide samples as able and apply for assistance Kandis Fantasia) on Dec 15th, 2020 for the next calendar year 2021.  Patient verbalizes understanding . [employer]ck [id number] application for [employer] on 06/12/19 . Will continue to follow   Patient Self Care Activities:  . Self administers medications as prescribed . Attends all scheduled provider appointments . Calls pharmacy for medication refills . Attends church or other social activities . Performs ADL's independently . Performs IADL's independently . Calls provider office for new concerns or questions  Please see past updates related to this goal by clicking on the "Past Updates" button in the selected goal      . I would like to optimize my medication management of my chronic conditions (pt-stated)       Current Barriers:  [name] Knowledge Deficits related to disease state managment and Non Adherence to prescribed medication regimen  Pharmacist Clinical  Goal(s):  [name] Over the next 90 days, patient will work with PharmD to address needs related to optimized  medication management of chronic conditions  Goal updated on 06/12/2019  Interventions: . Comprehensive medication review performed.  Reviewed medication fill history via insurance claims data confirming patient appears compliant with having his medications filled on time as prescribed by provider. . Reviewed & discussed the following diabetes-related information with patient: o Continue checking blood sugars as directed o Follow ADA recommended "diabetes-friendly" diet  (reviewed healthy snack/food options) o Encouraged patient to exercise as able and as recommended by PCP. o Confirmed current DM regimen: [name] 50-1000mg  -->1 tablet by mouth twice daily o Patient uses Accu Check Guide glucometer.  He checks his blood sugar each morning o Reviewed ALL medication purposes/side effects-->patient denies adverse events, denies hypoglycemia, reports FBG 90s-110s.  Encouraged patient to continue taking all medications as prescribed. o Most recent A1c is 6.4% on 03/05/19 (was 6.3% on 10/30/18).  Most recent Scr was 1.12 (baseline). o Antihyperlipidemia regimen: rosuvastatin 10mg  daily o Antihypertensive regimen: olmesartan/HCTZ 20mg /12.5mg  daily o Continue taking all medications as prescribed by provider   Patient Self Care Activities:  . Self administers medications as prescribed . Attends all scheduled provider appointments . Calls pharmacy for medication refills . Performs ADL's independently . Performs IADL's independently  Please see past updates related to this goal by clicking on the "Past Updates" button in the selected goal         The patient verbalized understanding of instructions provided today and declined a print copy  of patient instruction materials.   The care management team will reach out to the patient again over the next 6-8 weeks  SIGNATURE Regina Eck,  PharmD, [place] Pharmacist, [place]: [phone]

## 2019-06-12 NOTE — Progress Notes (Signed)
Chronic Care Management   Visit Note  06/12/2019 Name: Andres [name] MRN: [id number] DOB: 1951/03/01  Referred by: Dorothyann Peng, MD Reason for referral : Chronic Care Management   Andres [name] is a 68 y.o. year old male who is a primary care patient of Dorothyann Peng, MD. The CCM team was consulted for assistance with chronic disease management and care coordination needs related to DMII  Review of patient status, including review of consultants reports, relevant laboratory and other test results, and collaboration with appropriate care team members and the patient's provider was performed as part of comprehensive patient evaluation and provision of chronic care management services.    Medications: Outpatient Encounter Medications as of 06/12/2019  Medication Sig  . ACCU-CHEK FASTCLIX LANCETS MISC USE TO CHECK BLOOD SUGAR TID BEFORE MEALS  . ACCU-CHEK GUIDE test strip USE TO CHECK BLOOD SUGAR TID BEFORE MEALS  . Arginine 500 MG CAPS Take by mouth.  [name] aspirin 81 MG tablet Take 81 mg by mouth daily.  . cholecalciferol (VITAMIN D) 1000 UNITS tablet Take 1,000 Units by mouth daily.  . Cyanocobalamin (VITAMIN B 12 PO) Take 1 capsule by mouth.  . fluticasone (CUTIVATE) 0.05 % cream Apply topically 2 (two) times daily.  [name] [name] 50-1000 MG tablet TAKE 1 TABLET BY MOUTH TWICE DAILY WITH A MEAL.  [name] levocetirizine (XYZAL) 5 MG tablet TAKE 1 TABLET BY MOUTH EVERY EVENING.  . Multiple Vitamin (MULTIVITAMIN) tablet Take 1 tablet by mouth daily.  [name] olmesartan-hydrochlorothiazide (BENICAR HCT) 20-12.5 MG tablet TAKE 1 TABLET BY MOUTH ONCE DAILY.  . rosuvastatin (CRESTOR) 10 MG tablet TAKE (1) TABLET BY MOUTH DAILY.  . tadalafil (CIALIS) 5 MG tablet Take 1 tablet (5 mg total) by mouth daily.   No facility-administered encounter medications on file as of 06/12/2019.      Objective:   Goals Addressed            This Visit's Progress     Patient Stated   . I would like to apply for  financial assistance for my medications (pt-stated)       Current Barriers:  . Non Adherence to prescribed medication regimen and Financial Barriers  Pharmacist Clinical Goal(s):  [name] Over the next 45 days, patient will work with CCM PharmD to address needs related to applying for financial assistance for [name]  Interventions: . Comprehensive medication review performed. . Advised patient to continue taking all medications as prescribed. Reviewed medication purpose.  Patient denies adverse events.   . Patient states he cannot afford his [name] ($45 copay/month, however patient is now in coverage gap and copay is $110).  He is agreeable to participate in patient assistance program through [employer]. Will prepare documents and leave at front PCP office for patient signature.  We will submit this for the year 2021 . [facility] CPhT, Lilla Shook following.   . Will continue to provide samples as able and apply for assistance Ferol Luz) on Dec 15th, 2020 for the next calendar year 2021.  Patient verbalizes understanding . [employer]ck 2021 application for [employer] on 06/12/19 . Will continue to follow   Patient Self Care Activities:  . Self administers medications as prescribed . Attends all scheduled provider appointments . Calls pharmacy for medication refills . Attends church or other social activities . Performs ADL's independently . Performs IADL's independently . Calls provider office for new concerns or questions  Please see past updates related to this goal by clicking on the "Past Updates" button in the  selected goal      . I would like to optimize my medication management of my chronic conditions (pt-stated)       Current Barriers:  [name] Knowledge Deficits related to disease state managment and Non Adherence to prescribed medication regimen  Pharmacist Clinical Goal(s):  [name] Over the next 90 days, patient will work with PharmD to address needs related to optimized  medication management  of chronic conditions  Goal updated on 06/12/2019  Interventions: . Comprehensive medication review performed.  Reviewed medication fill history via insurance claims data confirming patient appears compliant with having his medications filled on time as prescribed by provider. . Reviewed & discussed the following diabetes-related information with patient: o Continue checking blood sugars as directed o Follow ADA recommended "diabetes-friendly" diet  (reviewed healthy snack/food options) o Encouraged patient to exercise as able and as recommended by PCP. o Confirmed current DM regimen: [name] 50-1000mg  -->1 tablet by mouth twice daily o Patient uses Accu Check Guide glucometer.  He checks his blood sugar each morning o Reviewed ALL medication purposes/side effects-->patient denies adverse events, denies hypoglycemia, reports FBG 90s-110s.  Encouraged patient to continue taking all medications as prescribed. o Most recent A1c is 6.4% on 03/05/19 (was 6.3% on 10/30/18).  Most recent Scr was 1.12 (baseline). o Antihyperlipidemia regimen: rosuvastatin 10mg  daily o Antihypertensive regimen: olmesartan/HCTZ 20mg /12.5mg  daily o Continue taking all medications as prescribed by provider   Patient Self Care Activities:  . Self administers medications as prescribed . Attends all scheduled provider appointments . Calls pharmacy for medication refills . Performs ADL's independently . Performs IADL's independently  Please see past updates related to this goal by clicking on the "Past Updates" button in the selected goal         Plan:   The care management team will reach out to the patient again over the next 6-8 weeks.  Provider Signature Kieth Brightly, PharmD, BCPS Clinical Pharmacist, [facility]  [facility]  Direct Dial: [phone]

## 2019-06-13 ENCOUNTER — Other Ambulatory Visit: Payer: Self-pay | Admitting: Internal Medicine

## 2019-06-26 ENCOUNTER — Telehealth: Payer: Self-pay

## 2019-07-01 ENCOUNTER — Encounter: Payer: Self-pay | Admitting: Internal Medicine

## 2019-07-01 ENCOUNTER — Other Ambulatory Visit: Payer: Self-pay

## 2019-07-01 ENCOUNTER — Ambulatory Visit (INDEPENDENT_AMBULATORY_CARE_PROVIDER_SITE_OTHER): Payer: Medicare HMO | Admitting: Internal Medicine

## 2019-07-01 VITALS — BP 110/82 | HR 79 | Temp 98.0°F | Ht 70.6 in | Wt 233.6 lb

## 2019-07-01 DIAGNOSIS — N182 Chronic kidney disease, stage 2 (mild): Secondary | ICD-10-CM

## 2019-07-01 DIAGNOSIS — E66811 Obesity, class 1: Secondary | ICD-10-CM

## 2019-07-01 DIAGNOSIS — Z794 Long term (current) use of insulin: Secondary | ICD-10-CM

## 2019-07-01 DIAGNOSIS — E7800 Pure hypercholesterolemia, unspecified: Secondary | ICD-10-CM | POA: Diagnosis not present

## 2019-07-01 DIAGNOSIS — Z6832 Body mass index (BMI) 32.0-32.9, adult: Secondary | ICD-10-CM

## 2019-07-01 DIAGNOSIS — I129 Hypertensive chronic kidney disease with stage 1 through stage 4 chronic kidney disease, or unspecified chronic kidney disease: Secondary | ICD-10-CM

## 2019-07-01 DIAGNOSIS — E1122 Type 2 diabetes mellitus with diabetic chronic kidney disease: Secondary | ICD-10-CM | POA: Diagnosis not present

## 2019-07-01 DIAGNOSIS — E6609 Other obesity due to excess calories: Secondary | ICD-10-CM

## 2019-07-01 MED ORDER — ROSUVASTATIN CALCIUM 10 MG PO TABS: ORAL_TABLET | ORAL | 2 refills | Status: DC

## 2019-07-01 MED ORDER — ACCU-CHEK FASTCLIX LANCETS MISC: 11 refills | Status: AC

## 2019-07-01 MED ORDER — OLMESARTAN MEDOXOMIL-HCTZ 20-12.5 MG PO TABS: 1.0000 | ORAL_TABLET | Freq: Every day | ORAL | 2 refills | Status: DC

## 2019-07-01 MED ORDER — ACCU-CHEK GUIDE VI STRP: ORAL_STRIP | 11 refills | Status: DC

## 2019-07-01 MED ORDER — LEVOCETIRIZINE DIHYDROCHLORIDE 5 MG PO TABS: 5.0000 mg | ORAL_TABLET | Freq: Every evening | ORAL | 2 refills | Status: DC

## 2019-07-01 NOTE — Progress Notes (Signed)
This visit occurred during the SARS-CoV-2 public health emergency.  Safety protocols were in place, including screening questions prior to the visit, additional usage of staff PPE, and extensive cleaning of exam room while observing appropriate contact time as indicated for disinfecting solutions.  Subjective:     Patient ID: Andres [name] , male    DOB: [birth year] , 68 y.o.   MRN: [id number]   Chief Complaint  Patient presents with  . Diabetes  . Hypertension    HPI  Diabetes He presents for his follow-up diabetic visit. He has type 2 diabetes mellitus. His disease course has been stable. There are no hypoglycemic associated symptoms. Pertinent negatives for diabetes include no blurred vision and no chest pain. There are no hypoglycemic complications. Diabetic complications include nephropathy. Risk factors for coronary artery disease include diabetes mellitus, dyslipidemia, hypertension and male sex. He is following a diabetic diet. His breakfast blood glucose is taken between 8-9 am. His breakfast blood glucose range is generally 90-110 mg/dl. An ACE inhibitor/angiotensin II receptor blocker is being taken. Eye exam is not current.  Hypertension This is a chronic problem. The current episode started more than 1 year ago. The problem has been gradually improving since onset. The problem is controlled. Pertinent negatives include no blurred vision, chest pain, palpitations or shortness of breath. Past treatments include angiotensin blockers and diuretics. The current treatment provides moderate improvement.     Past Medical History:  Diagnosis Date  . Diabetes ([place])   . High cholesterol   . Hypertension    states under control with med., has been on med. x 5 yr.  . Seasonal allergies   . Umbilical hernia 10/9673     Family History  Problem Relation Age of Onset  . Healthy Mother   . Early death Father      Current Outpatient Medications:  .  Accu-Chek FastClix Lancets MISC,  One tab po qd dx; e11.65, Disp: 100 each, Rfl: 11 .  ACCU-CHEK GUIDE test strip, Use to check BS qd dx: E11.65, Disp: 100 each, Rfl: 11 .  Arginine 500 MG CAPS, Take by mouth., Disp: , Rfl:  .  aspirin 81 MG tablet, Take 81 mg by mouth daily., Disp: , Rfl:  .  cholecalciferol (VITAMIN D) 1000 UNITS tablet, Take 1,000 Units by mouth daily., Disp: , Rfl:  .  Cyanocobalamin (VITAMIN B 12 PO), Take 1 capsule by mouth., Disp: , Rfl:  .  fluticasone (CUTIVATE) 0.05 % cream, Apply topically 2 (two) times daily., Disp: 60 g, Rfl: 2 .  levocetirizine (XYZAL) 5 MG tablet, Take 1 tablet (5 mg total) by mouth every evening., Disp: 90 tablet, Rfl: 2 .  Multiple Vitamin (MULTIVITAMIN) tablet, Take 1 tablet by mouth daily., Disp: , Rfl:  .  olmesartan-hydrochlorothiazide (BENICAR HCT) 20-12.5 MG tablet, Take 1 tablet by mouth daily., Disp: 90 tablet, Rfl: 2 .  rosuvastatin (CRESTOR) 10 MG tablet, TAKE (1) TABLET BY MOUTH DAILY., Disp: 90 tablet, Rfl: 2 .  SitaGLIPtin-MetFORMIN HCl ([name] XR) 50-1000 MG TB24, Take 1 tablet by mouth daily., Disp: , Rfl:  .  tadalafil (CIALIS) 5 MG tablet, Take 1 tablet (5 mg total) by mouth daily., Disp: 90 tablet, Rfl: 1 .  [name] 50-1000 MG tablet, TAKE 1 TABLET BY MOUTH TWICE DAILY WITH A MEAL. (Patient not taking: Reported on 07/01/2019), Disp: 60 tablet, Rfl: 0   No Known Allergies   Review of Systems  Constitutional: Negative.   Eyes: Negative for blurred vision.  Respiratory:  Negative.  Negative for shortness of breath.   Cardiovascular: Negative.  Negative for chest pain and palpitations.  Gastrointestinal: Negative.   Neurological: Negative.   Psychiatric/Behavioral: Negative.      Today's Vitals   07/01/19 1113  BP: 110/82  Pulse: 79  Temp: 98 F (36.7 C)  TempSrc: Oral  Weight: 233 lb 9.6 oz (106 kg)  Height: 5' 10.6" (1.793 m)  PainSc: 0-No pain   Body mass index is 32.95 kg/m.   Objective:  Physical Exam Vitals and nursing note reviewed.   Constitutional:      Appearance: Normal appearance.  Cardiovascular:     Rate and Rhythm: Normal rate and regular rhythm.     Heart sounds: Normal heart sounds.  Pulmonary:     Effort: Pulmonary effort is normal.     Breath sounds: Normal breath sounds.  Skin:    General: Skin is warm.  Neurological:     General: No focal deficit present.     Mental Status: He is alert.  Psychiatric:        Mood and Affect: Mood normal.         ASSESSMENT/PLAN:  1. Type 2 diabetes mellitus with stage 2 chronic kidney disease, with long-term current use of insulin (HCC)  Chronic.  I will check labs as listed below. Importance of regular exercise was discussed with the patient. He is also encouraged to take meds as directed. I will adjust meds as needed based on labs results once reviewed.   - Hemoglobin A1c - BMP8+EGFR  2. Hypertensive nephropathy  Chronic, well controlled. He will continue with current meds. I will check renal function today.   3. Pure hypercholesterolemia  Chronic. He was given refill of rosuvastatin. Importance of statin compliance was discussed with the patient.   4. Class 1 obesity due to excess calories with serious comorbidity and body mass index (BMI) of 32.0 to 32.9 in adult  He is encouraged to strive for BMI less than 28 to decrease cardiac risk. He is encouraged to aim for 150 minutes of exercise per week.    [name], MD    THE PATIENT IS ENCOURAGED TO PRACTICE SOCIAL DISTANCING DUE TO THE COVID-19 PANDEMIC.

## 2019-07-02 LAB — BMP8+EGFR
BUN/Creatinine Ratio: 11 (ref 10–24)
BUN: 13 mg/dL (ref 8–27)
CO2: 25 mmol/L (ref 20–29)
Calcium: 9.7 mg/dL (ref 8.6–10.2)
Chloride: 99 mmol/L (ref 96–106)
Creatinine, Ser: 1.23 mg/dL (ref 0.76–1.27)
GFR calc Af Amer: 70 mL/min/{1.73_m2} (ref >59)
GFR calc non Af Amer: 61 mL/min/{1.73_m2} (ref >59)
Glucose: 122 mg/dL — ABNORMAL HIGH (ref 65–99)
Potassium: 4.8 mmol/L (ref 3.5–5.2)
Sodium: 139 mmol/L (ref 134–144)

## 2019-07-02 LAB — HEMOGLOBIN A1C
Est. average glucose Bld gHb Est-mCnc: 146 mg/dL
Hgb A1c MFr Bld: 6.7 % — ABNORMAL HIGH (ref 4.8–5.6)

## 2019-07-17 ENCOUNTER — Telehealth: Payer: Self-pay | Admitting: Pharmacist

## 2019-07-29 ENCOUNTER — Ambulatory Visit: Payer: Self-pay | Admitting: Pharmacist

## 2019-07-29 DIAGNOSIS — E1122 Type 2 diabetes mellitus with diabetic chronic kidney disease: Secondary | ICD-10-CM

## 2019-07-31 ENCOUNTER — Ambulatory Visit (INDEPENDENT_AMBULATORY_CARE_PROVIDER_SITE_OTHER): Payer: Medicare HMO | Admitting: Pharmacist

## 2019-07-31 DIAGNOSIS — N182 Chronic kidney disease, stage 2 (mild): Secondary | ICD-10-CM | POA: Diagnosis not present

## 2019-07-31 DIAGNOSIS — Z794 Long term (current) use of insulin: Secondary | ICD-10-CM | POA: Diagnosis not present

## 2019-07-31 DIAGNOSIS — E1122 Type 2 diabetes mellitus with diabetic chronic kidney disease: Secondary | ICD-10-CM

## 2019-07-31 NOTE — Patient Instructions (Signed)
Visit Information  Goals Addressed            This Visit's Progress     Patient Stated   . I would like to optimize my medication management of my chronic conditions (pt-stated)       Current Barriers:  [name] Knowledge Deficits related to disease state managment and Non Adherence to prescribed medication regimen  Pharmacist Clinical Goal(s):  [name] Over the next 90 days, patient will work with PharmD to address needs related to optimized  medication management of chronic conditions  Goal updated on 06/12/2019  Interventions: . Comprehensive medication review performed.  Reviewed medication fill history via insurance claims data confirming patient appears compliant with having his medications filled on time as prescribed by provider. . Reviewed & discussed the following diabetes-related information with patient: o Continue checking blood sugars as directed o Follow ADA recommended "diabetes-friendly" diet  (reviewed healthy snack/food options) o Encouraged patient to exercise as able and as recommended by PCP. o Confirmed current DM regimen: [name] 50-1000mg  -->1 tablet by mouth twice daily - Consider switching SGLT2-/metformin combo - Difficulty obtaining janumet via [employer] and better outcomes w/ SGLT2s o Patient uses Accu Check Guide glucometer.  He checks his blood sugar each morning o Reviewed ALL medication purposes/side effects-->patient denies adverse events, denies hypoglycemia, reports FBG 90s-110s.  Encouraged patient to continue taking all medications as prescribed. o Most recent A1c is 6.7% on 07/02/19 (was 6.5%).  Most recent Scr 1.23 o Antihyperlipidemia regimen: rosuvastatin 10mg  daily o Antihypertensive regimen: olmesartan/HCTZ 20mg /12.5mg  daily  Patient Self Care Activities:  . Self administers medications as prescribed . Attends all scheduled provider appointments . Calls pharmacy for medication refills . Performs ADL's independently . Performs IADL's  independently  Please see past updates related to this goal by clicking on the "Past Updates" button in the selected goal         The patient verbalized understanding of instructions provided today and declined a print copy of patient instruction materials.   The care management team will reach out to the patient again over the next 14 days.   SIGNATURE Regina Eck, PharmD, BCPS Clinical Pharmacist, [facility] [place]: [phone]

## 2019-07-31 NOTE — Progress Notes (Signed)
Chronic Care Management    Visit Note  07/31/2019 Name: Andres [name] MRN: [id number] DOB: [birth year]  Referred by: Glendale Chard, MD Reason for referral : Chronic Care Management (Diabetes)   Andres [name] is a 69 y.o. year old male who is a primary care patient of Glendale Chard, MD. The CCM team was consulted for assistance with chronic disease management and care coordination needs related to DMII  Review of patient status, including review of consultants reports, relevant laboratory and other test results, and collaboration with appropriate care team members and the patient's provider was performed as part of comprehensive patient evaluation and provision of chronic care management services.    Patient was contacted via telephone for today's visit regarding diabetes and chronic care management  Medications: Outpatient Encounter Medications as of 07/31/2019  Medication Sig  . Accu-Chek FastClix Lancets MISC One tab po qd dx; e11.65  . ACCU-CHEK GUIDE test strip Use to check BS qd dx: E11.65  . Arginine 500 MG CAPS Take by mouth.  [name] aspirin 81 MG tablet Take 81 mg by mouth daily.  . cholecalciferol (VITAMIN D) 1000 UNITS tablet Take 1,000 Units by mouth daily.  . Cyanocobalamin (VITAMIN B 12 PO) Take 1 capsule by mouth.  . fluticasone (CUTIVATE) 0.05 % cream Apply topically 2 (two) times daily.  [name] [name] 50-1000 MG tablet TAKE 1 TABLET BY MOUTH TWICE DAILY WITH A MEAL. (Patient not taking: Reported on 07/01/2019)  . levocetirizine (XYZAL) 5 MG tablet Take 1 tablet (5 mg total) by mouth every evening.  . Multiple Vitamin (MULTIVITAMIN) tablet Take 1 tablet by mouth daily.  [name] olmesartan-hydrochlorothiazide (BENICAR HCT) 20-12.5 MG tablet Take 1 tablet by mouth daily.  . rosuvastatin (CRESTOR) 10 MG tablet TAKE (1) TABLET BY MOUTH DAILY.  [name] SitaGLIPtin-MetFORMIN HCl ([name] XR) 50-1000 MG TB24 Take 1 tablet by mouth daily.  . tadalafil (CIALIS) 5 MG tablet Take 1 tablet (5 mg  total) by mouth daily.   No facility-administered encounter medications on file as of 07/31/2019.     Objective:   Goals Addressed            This Visit's Progress     Patient Stated   . I would like to optimize my medication management of my chronic conditions (pt-stated)       Current Barriers:  [name] Knowledge Deficits related to disease state managment and Non Adherence to prescribed medication regimen  Pharmacist Clinical Goal(s):  [name] Over the next 90 days, patient will work with PharmD to address needs related to optimized  medication management of chronic conditions  Goal updated on 06/12/2019  Interventions: . Comprehensive medication review performed.  Reviewed medication fill history via insurance claims data confirming patient appears compliant with having his medications filled on time as prescribed by provider. . Reviewed & discussed the following diabetes-related information with patient: o Continue checking blood sugars as directed o Follow ADA recommended "diabetes-friendly" diet  (reviewed healthy snack/food options) o Encouraged patient to exercise as able and as recommended by PCP. o Confirmed current DM regimen: [name] 50-1000mg  -->1 tablet by mouth twice daily - Consider switching SGLT2-/metformin combo - Difficulty obtaining janumet via [employer] and better outcomes w/ SGLT2s o Patient uses Accu Check Guide glucometer.  He checks his blood sugar each morning o Reviewed ALL medication purposes/side effects-->patient denies adverse events, denies hypoglycemia, reports FBG 90s-110s.  Encouraged patient to continue taking all medications as prescribed. o Most recent A1c is 6.7% on 07/02/19 (was 6.5%).  Most recent Scr 1.23 o Antihyperlipidemia regimen: rosuvastatin 10mg  daily o Antihypertensive regimen: olmesartan/HCTZ 20mg /12.5mg  daily  Patient Self Care Activities:  . Self administers medications as prescribed . Attends all scheduled provider appointments . Calls  pharmacy for medication refills . Performs ADL's independently . Performs IADL's independently  Please see past updates related to this goal by clicking on the "Past Updates" button in the selected goal         Plan:   The care management team will reach out to the patient again over the next 14 days.   Provider Signature Regina Eck, PharmD, BCPS Clinical Pharmacist, [facility] [place]: [phone]

## 2019-07-31 NOTE — Progress Notes (Signed)
  Chronic Care Management   Outreach Note  07/29/2019 Name: Andres [name] MRN: [id number] DOB: Jul 17, 1950  Referred by: Glendale Chard, MD Reason for referral : Chronic Care Management (Diabetes)   An unsuccessful telephone outreach was attempted today. The patient was referred to the case management team by for assistance with care management and care coordination.   Follow Up Plan: A HIPPA compliant phone message was left for the patient providing contact information and requesting a return call.  The care management team will reach out to the patient again over the next 7-10 days.   SIGNATURE Regina Eck, PharmD, BCPS Clinical Pharmacist, [facility] [place]: [phone]

## 2019-08-07 ENCOUNTER — Ambulatory Visit: Payer: Self-pay | Admitting: Pharmacist

## 2019-08-07 DIAGNOSIS — E1122 Type 2 diabetes mellitus with diabetic chronic kidney disease: Secondary | ICD-10-CM

## 2019-08-08 NOTE — Progress Notes (Signed)
Chronic Care Management    Visit Note  08/07/2019 Name: Andres [name] MRN: [id number] DOB: 1950/12/08  Referred by: Glendale Chard, MD Reason for referral : Chronic Care Management   Andres [name] is a 69 y.o. year old male who is a primary care patient of Glendale Chard, MD. The CCM team was consulted for assistance with chronic disease management and care coordination needs related to DMII  Review of patient status, including review of consultants reports, relevant laboratory and other test results, and collaboration with appropriate care team members and the patient's provider was performed as part of comprehensive patient evaluation and provision of chronic care management services.     Medications: Outpatient Encounter Medications as of 08/07/2019  Medication Sig  . Accu-Chek FastClix Lancets MISC One tab po qd dx; e11.65  . ACCU-CHEK GUIDE test strip Use to check BS qd dx: E11.65  . Arginine 500 MG CAPS Take by mouth.  [name] aspirin 81 MG tablet Take 81 mg by mouth daily.  . cholecalciferol (VITAMIN D) 1000 UNITS tablet Take 1,000 Units by mouth daily.  . Cyanocobalamin (VITAMIN B 12 PO) Take 1 capsule by mouth.  . fluticasone (CUTIVATE) 0.05 % cream Apply topically 2 (two) times daily.  [name] [name] 50-1000 MG tablet TAKE 1 TABLET BY MOUTH TWICE DAILY WITH A MEAL. (Patient not taking: Reported on 07/01/2019)  . levocetirizine (XYZAL) 5 MG tablet Take 1 tablet (5 mg total) by mouth every evening.  . Multiple Vitamin (MULTIVITAMIN) tablet Take 1 tablet by mouth daily.  [name] olmesartan-hydrochlorothiazide (BENICAR HCT) 20-12.5 MG tablet Take 1 tablet by mouth daily.  . rosuvastatin (CRESTOR) 10 MG tablet TAKE (1) TABLET BY MOUTH DAILY.  [name] SitaGLIPtin-MetFORMIN HCl ([name] XR) 50-1000 MG TB24 Take 1 tablet by mouth 2 (two) times daily.   . tadalafil (CIALIS) 5 MG tablet Take 1 tablet (5 mg total) by mouth daily.   No facility-administered encounter medications on file as of 08/07/2019.       Objective:   Goals Addressed            This Visit's Progress     Patient Stated   . I would like to apply for financial assistance for my medications (pt-stated)       Current Barriers:  . Non Adherence to prescribed medication regimen and Financial Barriers  Pharmacist Clinical Goal(s):  [name] Over the next 45 days, patient will work with CCM PharmD to address needs related to applying for financial assistance for [name]  Interventions: . Comprehensive medication review performed. . Advised patient to continue taking all medications as prescribed. Reviewed medication purpose.  Patient denies adverse events.   . Patient states he cannot afford his [name] ($45 copay/month, however patient is now in coverage gap and copay is $110).  He is agreeable to participate in patient assistance program through [employer]. Will prepare documents and leave at front PCP office for patient signature.  We will submit this for the year 2021 . [facility] CPhT, Etter Sjogren following.   . Will continue to provide samples as able and apply for assistance Andres [name]) on Dec 15th, 2020 for the next calendar year 2021.  Patient verbalizes understanding . [employer] [id number] application for [employer] again on 08/07/19 . Will continue to follow   Patient Self Care Activities:  . Self administers medications as prescribed . Attends all scheduled provider appointments . Calls pharmacy for medication refills . Attends church or other social activities . Performs ADL's independently . Performs IADL's independently .  Calls provider office for new concerns or questions  Please see past updates related to this goal by clicking on the "Past Updates" button in the selected goal            Plan:   The care management team will reach out to the patient again over the next 30 days.   Provider Signature Regina Eck, PharmD, BCPS Clinical Pharmacist, [facility] [place]: [phone]

## 2019-08-08 NOTE — Patient Instructions (Signed)
Visit Information  Goals Addressed            This Visit's Progress     Patient Stated   . I would like to apply for financial assistance for my medications (pt-stated)       Current Barriers:  . Non Adherence to prescribed medication regimen and Financial Barriers  Pharmacist Clinical Goal(s):  [name] Over the next 45 days, patient will work with CCM PharmD to address needs related to applying for financial assistance for [name]  Interventions: . Comprehensive medication review performed. . Advised patient to continue taking all medications as prescribed. Reviewed medication purpose.  Patient denies adverse events.   . Patient states he cannot afford his [name] ($45 copay/month, however patient is now in coverage gap and copay is $110).  He is agreeable to participate in patient assistance program through [employer]. Will prepare documents and leave at front PCP office for patient signature.  We will submit this for the year 2021 . [facility] CPhT, Etter Sjogren following.   . Will continue to provide samples as able and apply for assistance Kandis Fantasia) on Dec 15th, 2020 for the next calendar year 2021.  Patient verbalizes understanding . [employer] [id number] application for [employer] again on 08/07/19 . Will continue to follow   Patient Self Care Activities:  . Self administers medications as prescribed . Attends all scheduled provider appointments . Calls pharmacy for medication refills . Attends church or other social activities . Performs ADL's independently . Performs IADL's independently . Calls provider office for new concerns or questions  Please see past updates related to this goal by clicking on the "Past Updates" button in the selected goal         The patient verbalized understanding of instructions provided today and declined a print copy of patient instruction materials.   The care management team will reach out to the patient again over the next 30 days.    SIGNATURE Regina Eck, PharmD, BCPS Clinical Pharmacist, [facility] [place]: [phone]

## 2019-08-13 ENCOUNTER — Other Ambulatory Visit: Payer: Self-pay | Admitting: Internal Medicine

## 2019-08-13 DIAGNOSIS — N529 Male erectile dysfunction, unspecified: Secondary | ICD-10-CM

## 2019-08-14 ENCOUNTER — Telehealth: Payer: Self-pay

## 2019-08-16 ENCOUNTER — Telehealth: Payer: Self-pay

## 2019-08-17 ENCOUNTER — Ambulatory Visit: Payer: No Typology Code available for payment source | Attending: Internal Medicine

## 2019-08-17 DIAGNOSIS — Z23 Encounter for immunization: Secondary | ICD-10-CM

## 2019-08-17 NOTE — Progress Notes (Signed)
   Covid-19 Vaccination Clinic  Name:  Andres [name]    MRN: [id number] DOB: [birth year]  08/17/2019  Mr. Hand was observed post Covid-19 immunization for 15 minutes without incidence. He was provided with Vaccine Information Sheet and instruction to access the V-Safe system.   [name] was instructed to call 911 with any severe reactions post vaccine: [name] Difficulty breathing  . Swelling of your face and throat  . A fast heartbeat  . A bad rash all over your body  . Dizziness and weakness    Immunizations Administered    Name Date Dose VIS Date Route   Moderna COVID-19 Vaccine 08/17/2019  1:06 PM 0.5 mL 06/11/2019 Intramuscular   Manufacturer: Moderna   Lot: [id number]   [id number]

## 2019-09-04 ENCOUNTER — Ambulatory Visit (INDEPENDENT_AMBULATORY_CARE_PROVIDER_SITE_OTHER): Payer: Medicare HMO | Admitting: Pharmacist

## 2019-09-04 DIAGNOSIS — N182 Chronic kidney disease, stage 2 (mild): Secondary | ICD-10-CM | POA: Diagnosis not present

## 2019-09-04 DIAGNOSIS — E1122 Type 2 diabetes mellitus with diabetic chronic kidney disease: Secondary | ICD-10-CM

## 2019-09-04 DIAGNOSIS — Z794 Long term (current) use of insulin: Secondary | ICD-10-CM | POA: Diagnosis not present

## 2019-09-04 NOTE — Patient Instructions (Signed)
Visit Information  Goals Addressed            This Visit's Progress     Patient Stated   . I would like to optimize my medication management of my chronic conditions (pt-stated)       Current Barriers:  [name] Knowledge Deficits related to disease state managment and Non Adherence to prescribed medication regimen  Pharmacist Clinical Goal(s):  [name] Over the next 90 days, patient will work with PharmD to address needs related to optimized  medication management of chronic conditions  Goal updated on 06/12/2019  Interventions: . Comprehensive medication review performed.  Reviewed medication fill history via insurance claims data confirming patient appears compliant with having his medications filled on time as prescribed by provider. . Reviewed & discussed the following diabetes-related information with patient: o Continue checking blood sugars as directed o Follow ADA recommended "diabetes-friendly" diet  (reviewed healthy snack/food options) o Encouraged patient to exercise as able and as recommended by PCP. o Confirmed current DM regimen: [name] 50-1000mg  XR -->1 tablet by mouth twice daily (samples provided) - Consider switching SGLT2-/metformin combo--pt will consider - Difficulty obtaining janumet via [employer] and better outcomes w/ SGLT2s o Patient uses Accu Check Guide glucometer.  He checks his blood sugar each morning o Reviewed ALL medication purposes/side effects-->patient denies adverse events, denies hypoglycemia, reports FBG 90s-110s.  Encouraged patient to continue taking all medications as prescribed. o Most recent A1c is 6.7% on 07/02/19 (was 6.5%).  Most recent Scr 1.23 o Antihyperlipidemia regimen: rosuvastatin 10mg  daily o Antihypertensive regimen: olmesartan/HCTZ 20mg /12.5mg  daily  Patient Self Care Activities:  . Self administers medications as prescribed . Attends all scheduled provider appointments . Calls pharmacy for medication refills . Performs ADL's  independently . Performs IADL's independently  Please see past updates related to this goal by clicking on the "Past Updates" button in the selected goal         The patient verbalized understanding of instructions provided today and declined a print copy of patient instruction materials.   The care management team will reach out to the patient again over the next 60 days.   SIGNATURE Regina Eck, PharmD, BCPS Clinical Pharmacist, [facility] [place]: [phone]

## 2019-09-04 NOTE — Progress Notes (Signed)
Chronic Care Management    Visit Note  09/04/2019 Name: Andres [name] MRN: [id number] DOB: Feb 14, 1951  Referred by: Glendale Chard, MD Reason for referral : Chronic Care Management and Diabetes   Andres [name] is a 69 y.o. year old male who is a primary care patient of Glendale Chard, MD. The CCM team was consulted for assistance with chronic disease management and care coordination needs related to DMII  Review of patient status, including review of consultants reports, relevant laboratory and other test results, and collaboration with appropriate care team members and the patient's provider was performed as part of comprehensive patient evaluation and provision of chronic care management services.    SDOH (Social Determinants of Health) assessments performed: Yes See Care Plan activities for detailed interventions related to SDOH)     Medications: Outpatient Encounter Medications as of 09/04/2019  Medication Sig  . Accu-Chek FastClix Lancets MISC One tab po qd dx; e11.65  . ACCU-CHEK GUIDE test strip Use to check BS qd dx: E11.65  . Arginine 500 MG CAPS Take by mouth.  [name] aspirin 81 MG tablet Take 81 mg by mouth daily.  . cholecalciferol (VITAMIN D) 1000 UNITS tablet Take 1,000 Units by mouth daily.  . Cyanocobalamin (VITAMIN B 12 PO) Take 1 capsule by mouth.  . fluticasone (CUTIVATE) 0.05 % cream Apply topically 2 (two) times daily.  [name] [name] 50-1000 MG tablet TAKE 1 TABLET BY MOUTH TWICE DAILY WITH A MEAL. (Patient not taking: Reported on 07/01/2019)  . levocetirizine (XYZAL) 5 MG tablet Take 1 tablet (5 mg total) by mouth every evening.  . Multiple Vitamin (MULTIVITAMIN) tablet Take 1 tablet by mouth daily.  [name] olmesartan-hydrochlorothiazide (BENICAR HCT) 20-12.5 MG tablet Take 1 tablet by mouth daily.  . rosuvastatin (CRESTOR) 10 MG tablet TAKE (1) TABLET BY MOUTH DAILY.  [name] SitaGLIPtin-MetFORMIN HCl ([name] XR) 50-1000 MG TB24 Take 1 tablet by mouth 2 (two) times daily.   .  tadalafil (CIALIS) 5 MG tablet TAKE 1 TABLET BY MOUTH ONCE A DAY.   No facility-administered encounter medications on file as of 09/04/2019.     Objective:   Goals Addressed            This Visit's Progress     Patient Stated   . I would like to optimize my medication management of my chronic conditions (pt-stated)       Current Barriers:  [name] Knowledge Deficits related to disease state managment and Non Adherence to prescribed medication regimen  Pharmacist Clinical Goal(s):  [name] Over the next 90 days, patient will work with PharmD to address needs related to optimized  medication management of chronic conditions  Goal updated on 06/12/2019  Interventions: . Comprehensive medication review performed.  Reviewed medication fill history via insurance claims data confirming patient appears compliant with having his medications filled on time as prescribed by provider. . Reviewed & discussed the following diabetes-related information with patient: o Continue checking blood sugars as directed o Follow ADA recommended "diabetes-friendly" diet  (reviewed healthy snack/food options) o Encouraged patient to exercise as able and as recommended by PCP. o Confirmed current DM regimen: [name] 50-1000mg  XR -->1 tablet by mouth twice daily (samples provided) - Consider switching SGLT2-/metformin combo--pt will consider - Difficulty obtaining janumet via [employer] and better outcomes w/ SGLT2s o Patient uses Accu Check Guide glucometer.  He checks his blood sugar each morning o Reviewed ALL medication purposes/side effects-->patient denies adverse events, denies hypoglycemia, reports FBG 90s-110s.  Encouraged patient to continue  taking all medications as prescribed. o Most recent A1c is 6.7% on 07/02/19 (was 6.5%).  Most recent Scr 1.23 o Antihyperlipidemia regimen: rosuvastatin 10mg  daily o Antihypertensive regimen: olmesartan/HCTZ 20mg /12.5mg  daily  Patient Self Care Activities:  . Self administers  medications as prescribed . Attends all scheduled provider appointments . Calls pharmacy for medication refills . Performs ADL's independently . Performs IADL's independently  Please see past updates related to this goal by clicking on the "Past Updates" button in the selected goal            Plan:   The care management team will reach out to the patient again over the next 60 days.   Provider Signature Regina Eck, PharmD, BCPS Clinical Pharmacist, [facility] [place]: [phone]

## 2019-09-10 ENCOUNTER — Other Ambulatory Visit: Payer: Self-pay | Admitting: Pharmacy Technician

## 2019-09-10 NOTE — Patient Outreach (Signed)
[name]) Care Management  09/10/2019  Andres [name] [birth year] [id number]    Follow up call placed to [employer] regarding patient assistance application(s) for Andres [name]' , Andres [name] confirms that application has been received. Attestation form mailed out to patient on 3/2 along with original application. Original application needs to be updated to include providers state license number as well as, the drug name is written incorrectly on the application.  Follow up:  Will route note to [place] to inform  Andres [name]. Chana Bode [place] Certified Pharmacy Technician [place] Management Direct Dial:[phone]

## 2019-09-11 ENCOUNTER — Ambulatory Visit (INDEPENDENT_AMBULATORY_CARE_PROVIDER_SITE_OTHER): Payer: Medicare HMO | Admitting: Pharmacist

## 2019-09-11 DIAGNOSIS — N182 Chronic kidney disease, stage 2 (mild): Secondary | ICD-10-CM

## 2019-09-11 DIAGNOSIS — E1122 Type 2 diabetes mellitus with diabetic chronic kidney disease: Secondary | ICD-10-CM

## 2019-09-11 DIAGNOSIS — Z794 Long term (current) use of insulin: Secondary | ICD-10-CM

## 2019-09-12 NOTE — Progress Notes (Signed)
Chronic Care Management   Visit Note  09/12/2019 Name: Andres [name] MRN: [id number] DOB: Aug 18, 1950  Referred by: Glendale Chard, MD Reason for referral : Chronic Care Management   Andres [name] is a 69 y.o. year old male who is a primary care patient of Glendale Chard, MD. The CCM team was consulted for assistance with chronic disease management and care coordination needs related to DMII  Review of patient status, including review of consultants reports, relevant laboratory and other test results, and collaboration with appropriate care team members and the patient's provider was performed as part of comprehensive patient evaluation and provision of chronic care management services.    SDOH (Social Determinants of Health) assessments performed: Yes See Care Plan activities for detailed interventions related to SDOH)     Medications: Outpatient Encounter Medications as of 09/11/2019  Medication Sig  . Accu-Chek FastClix Lancets MISC One tab po qd dx; e11.65  . ACCU-CHEK GUIDE test strip Use to check BS qd dx: E11.65  . Arginine 500 MG CAPS Take by mouth.  [name] aspirin 81 MG tablet Take 81 mg by mouth daily.  . cholecalciferol (VITAMIN D) 1000 UNITS tablet Take 1,000 Units by mouth daily.  . Cyanocobalamin (VITAMIN B 12 PO) Take 1 capsule by mouth.  . fluticasone (CUTIVATE) 0.05 % cream Apply topically 2 (two) times daily.  [name] [name] 50-1000 MG tablet TAKE 1 TABLET BY MOUTH TWICE DAILY WITH A MEAL. (Patient not taking: Reported on 07/01/2019)  . levocetirizine (XYZAL) 5 MG tablet Take 1 tablet (5 mg total) by mouth every evening.  . Multiple Vitamin (MULTIVITAMIN) tablet Take 1 tablet by mouth daily.  [name] olmesartan-hydrochlorothiazide (BENICAR HCT) 20-12.5 MG tablet Take 1 tablet by mouth daily.  . rosuvastatin (CRESTOR) 10 MG tablet TAKE (1) TABLET BY MOUTH DAILY.  [name] SitaGLIPtin-MetFORMIN HCl ([name] XR) 50-1000 MG TB24 Take 1 tablet by mouth 2 (two) times daily.   . tadalafil  (CIALIS) 5 MG tablet TAKE 1 TABLET BY MOUTH ONCE A DAY.   No facility-administered encounter medications on file as of 09/11/2019.     Objective:   Goals Addressed            This Visit's Progress     Patient Stated   . I would like to apply for financial assistance for my medications (pt-stated)       Current Barriers:  . Non Adherence to prescribed medication regimen and Financial Barriers  Pharmacist Clinical Goal(s):  [name] Over the next 45 days, patient will work with CCM PharmD to address needs related to applying for financial assistance for [name]  Interventions: . Comprehensive medication review performed. . Advised patient to continue taking all medications as prescribed. Reviewed medication purpose.  Patient denies adverse events.   . Patient states he cannot afford his [name] ($45 copay/month, however patient is now in coverage gap and copay is $110).  He is agreeable to participate in patient assistance program through [employer]. Will prepare documents and leave at front PCP office for patient signature.  We will submit this for the year 2021 . [facility] CPhT, Etter Sjogren following.   . Will continue to provide samples as able and apply for assistance Kandis Fantasia) on Dec 15th, 2020 for the next calendar year 2021.  Patient verbalizes understanding . [employer] [id number] application for [employer] again on 08/07/19 o 09/10/19: [facility] mailed letter of attestation to patient this week.  Instructed patient to bring to me and I will file.  Will provided samples at  that time. . Will continue to follow   Patient Self Care Activities:  . Self administers medications as prescribed . Attends all scheduled provider appointments . Calls pharmacy for medication refills . Attends church or other social activities . Performs ADL's independently . Performs IADL's independently . Calls provider office for new concerns or questions  Please see past updates related to this goal by clicking on the  "Past Updates" button in the selected goal      . I would like to optimize my medication management of my chronic conditions (pt-stated)       Current Barriers:  [name] Knowledge Deficits related to disease state managment and Non Adherence to prescribed medication regimen  Pharmacist Clinical Goal(s):  [name] Over the next 90 days, patient will work with PharmD to address needs related to optimized  medication management of chronic conditions  Goal updated on 06/12/2019  Interventions: . Comprehensive medication review performed.  Reviewed medication fill history via insurance claims data confirming patient appears compliant with having his medications filled on time as prescribed by provider. . Reviewed & discussed the following diabetes-related information with patient: o Continue checking blood sugars as directed o Follow ADA recommended "diabetes-friendly" diet  (reviewed healthy snack/food options) o Encouraged patient to exercise as able and as recommended by PCP. o Confirmed current DM regimen: [name] 50-1000mg  XR -->1 tablet by mouth twice daily (samples provided) - Consider switching SGLT2-/metformin combo--pt will consider - Difficulty obtaining janumet via [employer] and better outcomes w/ SGLT2s o Patient uses Accu Check Guide glucometer.  He checks his blood sugar each morning o Reviewed ALL medication purposes/side effects-->patient denies adverse events, denies hypoglycemia, reports FBG 90s-110s.  Encouraged patient to continue taking all medications as prescribed. o Most recent A1c is 6.7% on 07/02/19 (was 6.5%).  Most recent Scr 1.23 o Antihyperlipidemia regimen: rosuvastatin 10mg  daily o Antihypertensive regimen: olmesartan/HCTZ 20mg /12.5mg  daily  Patient Self Care Activities:  . Self administers medications as prescribed . Attends all scheduled provider appointments . Calls pharmacy for medication refills . Performs ADL's independently . Performs IADL's independently  Please see  past updates related to this goal by clicking on the "Past Updates" button in the selected goal          Plan:   The care management team will reach out to the patient again over the next 30 days.   Provider Signature Regina Eck, PharmD, BCPS Clinical Pharmacist, [facility] [place]: [phone]

## 2019-09-12 NOTE — Patient Instructions (Signed)
Visit Information  Goals Addressed            This Visit's Progress     Patient Stated   . I would like to apply for financial assistance for my medications (pt-stated)       Current Barriers:  . Non Adherence to prescribed medication regimen and Financial Barriers  Pharmacist Clinical Goal(s):  [name] Over the next 45 days, patient will work with CCM PharmD to address needs related to applying for financial assistance for [name]  Interventions: . Comprehensive medication review performed. . Advised patient to continue taking all medications as prescribed. Reviewed medication purpose.  Patient denies adverse events.   . Patient states he cannot afford his [name] ($45 copay/month, however patient is now in coverage gap and copay is $110).  He is agreeable to participate in patient assistance program through [employer]. Will prepare documents and leave at front PCP office for patient signature.  We will submit this for the year 2021 . [facility] CPhT, Etter Sjogren following.   . Will continue to provide samples as able and apply for assistance Kandis Fantasia) on Dec 15th, 2020 for the next calendar year 2021.  Patient verbalizes understanding . [employer] [id number] application for [employer] again on 08/07/19 o 09/10/19: [facility] mailed letter of attestation to patient this week.  Instructed patient to bring to me and I will file.  Will provided samples at that time. . Will continue to follow   Patient Self Care Activities:  . Self administers medications as prescribed . Attends all scheduled provider appointments . Calls pharmacy for medication refills . Attends church or other social activities . Performs ADL's independently . Performs IADL's independently . Calls provider office for new concerns or questions  Please see past updates related to this goal by clicking on the "Past Updates" button in the selected goal         The patient verbalized understanding of instructions provided today and  declined a print copy of patient instruction materials.   The care management team will reach out to the patient again over the next 30 days.   SIGNATURE Regina Eck, PharmD, BCPS Clinical Pharmacist, [facility] [place]: [phone]

## 2019-09-17 ENCOUNTER — Ambulatory Visit: Payer: No Typology Code available for payment source | Attending: Internal Medicine

## 2019-09-17 DIAGNOSIS — Z23 Encounter for immunization: Secondary | ICD-10-CM

## 2019-09-17 NOTE — Progress Notes (Signed)
   Covid-19 Vaccination Clinic  Name:  Andres [name]    MRN: [id number] DOB: 10/03/1950  09/17/2019  [name] was observed post Covid-19 immunization for 15 minutes without incident. He was provided with Vaccine Information Sheet and instruction to access the V-Safe system.   [name] was instructed to call 911 with any severe reactions post vaccine: [name] Difficulty breathing  . Swelling of face and throat  . A fast heartbeat  . A bad rash all over body  . Dizziness and weakness   Immunizations Administered    Name Date Dose VIS Date Route   Moderna COVID-19 Vaccine 09/17/2019 12:28 PM 0.5 mL 06/11/2019 Intramuscular   Manufacturer: Moderna   Lot: [id number]   [place]:[id number]

## 2019-09-25 ENCOUNTER — Ambulatory Visit: Payer: Self-pay | Admitting: Pharmacist

## 2019-09-25 DIAGNOSIS — E1122 Type 2 diabetes mellitus with diabetic chronic kidney disease: Secondary | ICD-10-CM

## 2019-09-25 NOTE — Progress Notes (Signed)
  Chronic Care Management   Outreach Note  09/25/2019 Name: Andres [name] MRN: [id number] DOB: [birth year]  Referred by: Glendale Chard, MD Reason for referral : Chronic Care Management and Diabetes   An unsuccessful telephone outreach was attempted today. The patient was referred to the case management team for assistance with care management and care coordination.  Message left with patient regarding samples and [employer] letter of attestation.   Follow Up Plan: A HIPPA compliant phone message was left for the patient providing contact information and requesting a return call.  The care management team will reach out to the patient again over the next 5 days.   SIGNATURE Regina Eck, PharmD, BCPS Clinical Pharmacist, [facility] [place]: [phone]

## 2019-10-02 ENCOUNTER — Ambulatory Visit: Payer: Self-pay | Admitting: Pharmacist

## 2019-10-02 DIAGNOSIS — N182 Chronic kidney disease, stage 2 (mild): Secondary | ICD-10-CM | POA: Diagnosis not present

## 2019-10-02 DIAGNOSIS — E1122 Type 2 diabetes mellitus with diabetic chronic kidney disease: Secondary | ICD-10-CM

## 2019-10-02 DIAGNOSIS — Z794 Long term (current) use of insulin: Secondary | ICD-10-CM | POA: Diagnosis not present

## 2019-10-02 NOTE — Patient Instructions (Signed)
Visit Information  Goals Addressed            This Visit's Progress     Patient Stated   . I would like to optimize my medication management of my chronic conditions (pt-stated)       Current Barriers:  [name] Knowledge Deficits related to disease state managment and Non Adherence to prescribed medication regimen  Pharmacist Clinical Goal(s):  [name] Over the next 90 days, patient will work with PharmD to address needs related to optimized  medication management of chronic conditions  Goal updated on 06/12/2019  Interventions: . Comprehensive medication review performed.  Reviewed medication fill history via insurance claims data confirming patient appears compliant with having his medications filled on time as prescribed by provider. . Reviewed & discussed the following diabetes-related information with patient: o Continue checking blood sugars as directed o Follow ADA recommended "diabetes-friendly" diet  (reviewed healthy snack/food options) o Encouraged patient to exercise as able and as recommended by PCP. o Confirmed current DM regimen: [name] 50-1000mg  XR -->1 tablet by mouth twice daily (samples provided) - Consider switching SGLT2-/metformin combo--pt will consider - 10/02/19-->Difficulty obtaining janumet via [employer] and better outcomes w/ SGLT2s . Reminded patient to mail back letter of attestation with entire application. . Samples left up front for patient o Patient uses Accu Check Guide glucometer.  He checks his blood sugar each morning o Reviewed ALL medication purposes/side effects-->patient denies adverse events, denies hypoglycemia, reports FBG 90s-110s.  Encouraged patient to continue taking all medications as prescribed. o Most recent A1c is 6.7% on 07/02/19 (was 6.5%).  Most recent Scr 1.23 o Antihyperlipidemia regimen: rosuvastatin 10mg  daily o Antihypertensive regimen: olmesartan/HCTZ 20mg /12.5mg  daily  Patient Self Care Activities:  . Self administers medications as  prescribed . Attends all scheduled provider appointments . Calls pharmacy for medication refills . Performs ADL's independently . Performs IADL's independently  Please see past updates related to this goal by clicking on the "Past Updates" button in the selected goal         The patient verbalized understanding of instructions provided today and declined a print copy of patient instruction materials.    SIGNATURE  Regina Eck, PharmD, BCPS Clinical Pharmacist, [facility] [place]: [phone]

## 2019-10-02 NOTE — Progress Notes (Signed)
Chronic Care Management   Visit Note  10/02/2019 Name: Andres [name] MRN: [id number] DOB: 1951-01-20  Referred by: Glendale Chard, MD Reason for referral : Chronic Care Management and Diabetes   Andres [name] is a 69 y.o. year old male who is a primary care patient of Glendale Chard, MD. The CCM team was consulted for assistance with chronic disease management and care coordination needs related to DMII  Review of patient status, including review of consultants reports, relevant laboratory and other test results, and collaboration with appropriate care team members and the patient's provider was performed as part of comprehensive patient evaluation and provision of chronic care management services.    SDOH (Social Determinants of Health) assessments performed: No See Care Plan activities for detailed interventions related to SDOH     Medications: Outpatient Encounter Medications as of 10/02/2019  Medication Sig  . Accu-Chek FastClix Lancets MISC One tab po qd dx; e11.65  . ACCU-CHEK GUIDE test strip Use to check BS qd dx: E11.65  . Arginine 500 MG CAPS Take by mouth.  [name] aspirin 81 MG tablet Take 81 mg by mouth daily.  . cholecalciferol (VITAMIN D) 1000 UNITS tablet Take 1,000 Units by mouth daily.  . Cyanocobalamin (VITAMIN B 12 PO) Take 1 capsule by mouth.  . fluticasone (CUTIVATE) 0.05 % cream Apply topically 2 (two) times daily.  [name] [name] 50-1000 MG tablet TAKE 1 TABLET BY MOUTH TWICE DAILY WITH A MEAL. (Patient not taking: Reported on 07/01/2019)  . levocetirizine (XYZAL) 5 MG tablet Take 1 tablet (5 mg total) by mouth every evening.  . Multiple Vitamin (MULTIVITAMIN) tablet Take 1 tablet by mouth daily.  [name] olmesartan-hydrochlorothiazide (BENICAR HCT) 20-12.5 MG tablet Take 1 tablet by mouth daily.  . rosuvastatin (CRESTOR) 10 MG tablet TAKE (1) TABLET BY MOUTH DAILY.  [name] SitaGLIPtin-MetFORMIN HCl ([name] XR) 50-1000 MG TB24 Take 1 tablet by mouth 2 (two) times daily.   .  tadalafil (CIALIS) 5 MG tablet TAKE 1 TABLET BY MOUTH ONCE A DAY.   No facility-administered encounter medications on file as of 10/02/2019.     Objective:   Goals Addressed            This Visit's Progress     Patient Stated   . I would like to optimize my medication management of my chronic conditions (pt-stated)       Current Barriers:  [name] Knowledge Deficits related to disease state managment and Non Adherence to prescribed medication regimen  Pharmacist Clinical Goal(s):  [name] Over the next 90 days, patient will work with PharmD to address needs related to optimized  medication management of chronic conditions  Goal updated on 06/12/2019  Interventions: . Comprehensive medication review performed.  Reviewed medication fill history via insurance claims data confirming patient appears compliant with having his medications filled on time as prescribed by provider. . Reviewed & discussed the following diabetes-related information with patient: o Continue checking blood sugars as directed o Follow ADA recommended "diabetes-friendly" diet  (reviewed healthy snack/food options) o Encouraged patient to exercise as able and as recommended by PCP. o Confirmed current DM regimen: [name] 50-1000mg  XR -->1 tablet by mouth twice daily (samples provided) - Consider switching SGLT2-/metformin combo--pt will consider - 10/02/19-->Difficulty obtaining janumet via [employer] and better outcomes w/ SGLT2s . Reminded patient to mail back letter of attestation with entire application. . Samples left up front for patient o Patient uses Accu Check Guide glucometer.  He checks his blood sugar each morning o  Reviewed ALL medication purposes/side effects-->patient denies adverse events, denies hypoglycemia, reports FBG 90s-110s.  Encouraged patient to continue taking all medications as prescribed. o Most recent A1c is 6.7% on 07/02/19 (was 6.5%).  Most recent Scr 1.23 o Antihyperlipidemia regimen: rosuvastatin  10mg  daily o Antihypertensive regimen: olmesartan/HCTZ 20mg /12.5mg  daily  Patient Self Care Activities:  . Self administers medications as prescribed . Attends all scheduled provider appointments . Calls pharmacy for medication refills . Performs ADL's independently . Performs IADL's independently  Please see past updates related to this goal by clicking on the "Past Updates" button in the selected goal          Provider Signature  Regina Eck, PharmD, [place] Pharmacist, [place]: [phone]

## 2019-11-05 ENCOUNTER — Encounter: Payer: Medicare HMO | Admitting: Internal Medicine

## 2019-11-05 ENCOUNTER — Ambulatory Visit: Payer: Medicare HMO

## 2019-11-07 ENCOUNTER — Other Ambulatory Visit: Payer: Self-pay

## 2019-11-07 ENCOUNTER — Ambulatory Visit (INDEPENDENT_AMBULATORY_CARE_PROVIDER_SITE_OTHER): Payer: Medicare HMO

## 2019-11-07 ENCOUNTER — Telehealth: Payer: Self-pay | Admitting: Internal Medicine

## 2019-11-07 ENCOUNTER — Encounter: Payer: Self-pay | Admitting: Internal Medicine

## 2019-11-07 ENCOUNTER — Ambulatory Visit (INDEPENDENT_AMBULATORY_CARE_PROVIDER_SITE_OTHER): Payer: Medicare HMO | Admitting: Internal Medicine

## 2019-11-07 VITALS — BP 118/82 | HR 87 | Temp 97.3°F | Ht 71.0 in | Wt 236.6 lb

## 2019-11-07 DIAGNOSIS — E6609 Other obesity due to excess calories: Secondary | ICD-10-CM | POA: Diagnosis not present

## 2019-11-07 DIAGNOSIS — Z794 Long term (current) use of insulin: Secondary | ICD-10-CM

## 2019-11-07 DIAGNOSIS — N182 Chronic kidney disease, stage 2 (mild): Secondary | ICD-10-CM

## 2019-11-07 DIAGNOSIS — I129 Hypertensive chronic kidney disease with stage 1 through stage 4 chronic kidney disease, or unspecified chronic kidney disease: Secondary | ICD-10-CM

## 2019-11-07 DIAGNOSIS — Z0000 Encounter for general adult medical examination without abnormal findings: Secondary | ICD-10-CM

## 2019-11-07 DIAGNOSIS — E1122 Type 2 diabetes mellitus with diabetic chronic kidney disease: Secondary | ICD-10-CM

## 2019-11-07 DIAGNOSIS — H6123 Impacted cerumen, bilateral: Secondary | ICD-10-CM | POA: Diagnosis not present

## 2019-11-07 DIAGNOSIS — Z6833 Body mass index (BMI) 33.0-33.9, adult: Secondary | ICD-10-CM

## 2019-11-07 LAB — POCT URINALYSIS DIPSTICK
Bilirubin, UA: NEGATIVE
Blood, UA: NEGATIVE
Glucose, UA: NEGATIVE
Ketones, UA: NEGATIVE
Leukocytes, UA: NEGATIVE
Nitrite, UA: NEGATIVE
Protein, UA: NEGATIVE
Spec Grav, UA: 1.025 (ref 1.010–1.025)
Urobilinogen, UA: 0.2 E.U./dL
pH, UA: 5.5 (ref 5.0–8.0)

## 2019-11-07 LAB — POCT UA - MICROALBUMIN
Albumin/Creatinine Ratio, Urine, POC: <30
Creatinine, POC: 300 mg/dL
Microalbumin Ur, POC: 30 mg/L

## 2019-11-07 NOTE — Patient Instructions (Signed)
Andres [name] , Thank you for taking time to come for your Medicare Wellness Visit. I appreciate your ongoing commitment to your health goals. Please review the following plan we discussed and let me know if I can assist you in the future.   Screening recommendations/referrals: Colonoscopy: 03/2015 Recommended yearly ophthalmology/optometry visit for glaucoma screening and checkup Recommended yearly dental visit for hygiene and checkup  Vaccinations: Influenza vaccine: 03/2019 Pneumococcal vaccine: 07/2018 Tdap vaccine: 04/2019 Shingles vaccine: discussed    Advanced directives: Please bring a copy of your POA (Power of [place]) and/or Living Will to your next appointment.    Conditions/risks identified: obesity  Next appointment:   Preventive Care 67 Years and Older, Male Preventive care refers to lifestyle choices and visits with your health care provider that can promote health and wellness. What does preventive care include?  A yearly physical exam. This is also called an annual well check.  Dental exams once or twice a year.  Routine eye exams. Ask your health care provider how often you should have your eyes checked.  Personal lifestyle choices, including:  Daily care of your teeth and gums.  Regular physical activity.  Eating a healthy diet.  Avoiding tobacco and drug use.  Limiting alcohol use.  Practicing safe sex.  Taking low doses of aspirin every day.  Taking vitamin and mineral supplements as recommended by your health care provider. What happens during an annual well check? The services and screenings done by your health care provider during your annual well check will depend on your age, overall health, lifestyle risk factors, and family history of disease. Counseling  Your health care provider may ask you questions about your:  Alcohol use.  Tobacco use.  Drug use.  Emotional well-being.  Home and relationship well-being.  Sexual  activity.  Eating habits.  History of falls.  Memory and ability to understand (cognition).  Work and work Statistician. Screening  You may have the following tests or measurements:  Height, weight, and BMI.  Blood pressure.  Lipid and cholesterol levels. These may be checked every 5 years, or more frequently if you are over 55 years old.  Skin check.  Lung cancer screening. You may have this screening every year starting at age 54 if you have a 30-pack-year history of smoking and currently smoke or have quit within the past 15 years.  Fecal occult blood test (FOBT) of the stool. You may have this test every year starting at age 2.  Flexible sigmoidoscopy or colonoscopy. You may have a sigmoidoscopy every 5 years or a colonoscopy every 10 years starting at age 1.  Prostate cancer screening. Recommendations will vary depending on your family history and other risks.  Hepatitis C blood test.  Hepatitis B blood test.  Sexually transmitted disease (STD) testing.  Diabetes screening. This is done by checking your blood sugar (glucose) after you have not eaten for a while (fasting). You may have this done every 1-3 years.  Abdominal aortic aneurysm (AAA) screening. You may need this if you are a current or former smoker.  Osteoporosis. You may be screened starting at age 39 if you are at high risk. Talk with your health care provider about your test results, treatment options, and if necessary, the need for more tests. Vaccines  Your health care provider may recommend certain vaccines, such as:  Influenza vaccine. This is recommended every year.  Tetanus, diphtheria, and acellular pertussis (Tdap, Td) vaccine. You may need a Td booster every 10 years.  Zoster vaccine. You may need this after age 56.  Pneumococcal 13-valent conjugate (PCV13) vaccine. One dose is recommended after age 76.  Pneumococcal polysaccharide (PPSV23) vaccine. One dose is recommended after age  43. Talk to your health care provider about which screenings and vaccines you need and how often you need them. This information is not intended to replace advice given to you by your health care provider. Make sure you discuss any questions you have with your health care provider. Document Released: 07/24/2015 Document Revised: 03/16/2016 Document Reviewed: 04/28/2015 Elsevier Interactive Patient Education  2017 [employer] Prevention in the Home Falls can cause injuries. They can happen to people of all ages. There are many things you can do to make your home safe and to help prevent falls. What can I do on the outside of my home?  Regularly fix the edges of walkways and driveways and fix any cracks.  Remove anything that might make you trip as you walk through a door, such as a raised step or threshold.  Trim any bushes or trees on the path to your home.  Use bright outdoor lighting.  Clear any walking paths of anything that might make someone trip, such as rocks or tools.  Regularly check to see if handrails are loose or broken. Make sure that both sides of any steps have handrails.  Any raised decks and porches should have guardrails on the edges.  Have any leaves, snow, or ice cleared regularly.  Use sand or salt on walking paths during winter.  Clean up any spills in your garage right away. This includes oil or grease spills. What can I do in the bathroom?  Use night lights.  Install grab bars by the toilet and in the tub and shower. Do not use towel bars as grab bars.  Use non-skid mats or decals in the tub or shower.  If you need to sit down in the shower, use a plastic, non-slip stool.  Keep the floor dry. Clean up any water that spills on the floor as soon as it happens.  Remove soap buildup in the tub or shower regularly.  Attach bath mats securely with double-sided non-slip rug tape.  Do not have throw rugs and other things on the floor that can make  you trip. What can I do in the bedroom?  Use night lights.  Make sure that you have a light by your bed that is easy to reach.  Do not use any sheets or blankets that are too big for your bed. They should not hang down onto the floor.  Have a firm chair that has side arms. You can use this for support while you get dressed.  Do not have throw rugs and other things on the floor that can make you trip. What can I do in the kitchen?  Clean up any spills right away.  Avoid walking on wet floors.  Keep items that you use a lot in easy-to-reach places.  If you need to reach something above you, use a strong step stool that has a grab bar.  Keep electrical cords out of the way.  Do not use floor polish or wax that makes floors slippery. If you must use wax, use non-skid floor wax.  Do not have throw rugs and other things on the floor that can make you trip. What can I do with my stairs?  Do not leave any items on the stairs.  Make sure that there are  handrails on both sides of the stairs and use them. Fix handrails that are broken or loose. Make sure that handrails are as long as the stairways.  Check any carpeting to make sure that it is firmly attached to the stairs. Fix any carpet that is loose or worn.  Avoid having throw rugs at the top or bottom of the stairs. If you do have throw rugs, attach them to the floor with carpet tape.  Make sure that you have a light switch at the top of the stairs and the bottom of the stairs. If you do not have them, ask someone to add them for you. What else can I do to help prevent falls?  Wear shoes that:  Do not have high heels.  Have rubber bottoms.  Are comfortable and fit you well.  Are closed at the toe. Do not wear sandals.  If you use a stepladder:  Make sure that it is fully opened. Do not climb a closed stepladder.  Make sure that both sides of the stepladder are locked into place.  Ask someone to hold it for you, if  possible.  Clearly mark and make sure that you can see:  Any grab bars or handrails.  First and last steps.  Where the edge of each step is.  Use tools that help you move around (mobility aids) if they are needed. These include:  Canes.  Walkers.  Scooters.  Crutches.  Turn on the lights when you go into a dark area. Replace any light bulbs as soon as they burn out.  Set up your furniture so you have a clear path. Avoid moving your furniture around.  If any of your floors are uneven, fix them.  If there are any pets around you, be aware of where they are.  Review your medicines with your doctor. Some medicines can make you feel dizzy. This can increase your chance of falling. Ask your doctor what other things that you can do to help prevent falls. This information is not intended to replace advice given to you by your health care provider. Make sure you discuss any questions you have with your health care provider. Document Released: 04/23/2009 Document Revised: 12/03/2015 Document Reviewed: 08/01/2014 Elsevier Interactive Patient Education  2017 [employer].

## 2019-11-07 NOTE — Progress Notes (Signed)
This visit occurred during the SARS-CoV-2 public health emergency.  Safety protocols were in place, including screening questions prior to the visit, additional usage of staff PPE, and extensive cleaning of exam room while observing appropriate contact time as indicated for disinfecting solutions.  Subjective:   Andres [name] is a 69 y.o. male who presents for Medicare Annual/Subsequent preventive examination.  Review of Systems:  n/a Cardiac Risk Factors include: advanced age (>107mn, >>20women);diabetes mellitus;hypertension;male gender;obesity (BMI >30kg/m2)     Objective:    Vitals: BP 118/82 (BP Location: Left Arm, Patient Position: Sitting, Cuff Size: Normal)   Pulse 87   Temp (!) 97.3 F (36.3 C) (Oral)   Ht 5' 11" (1.803 m)   Wt 236 lb 9.6 oz (107.3 kg)   SpO2 97%   BMI 33.00 kg/m   Body mass index is 33 kg/m.  Advanced Directives 11/07/2019 10/30/2018 09/25/2014 09/19/2014  Does Patient Have a Medical Advance Directive? Yes No Yes Yes  Type of Advance Directive Living will - HSoldierLiving will Living will;Healthcare Power of Attorney  Does patient want to make changes to medical advance directive? - - No - Patient declined No - Patient declined  Copy of HEstanciain Chart? - - No - copy requested -  Would patient like information on creating a medical advance directive? - Yes (MAU/Ambulatory/Procedural Areas - Information given) - -    Tobacco Social History   Tobacco Use  Smoking Status Former Smoker  . Packs/day: 0.50  . Years: 22.00  . Pack years: 11.00  . Types: Cigarettes  . Quit date: 07/11/2011  . Years since quitting: 8.3  Smokeless Tobacco Never Used  Tobacco Comment   he has quit     Counseling given: Not Answered Comment: he has quit   Clinical Intake:  Pre-visit preparation completed: Yes  Pain : No/denies pain     Nutritional Status: BMI > 30  Obese Nutritional Risks: None Diabetes: Yes  How  often do you need to have someone help you when you read instructions, pamphlets, or other written materials from your doctor or pharmacy?: 1 - Never What is the last grade level you completed in school?: 12th grade  Interpreter Needed?: No  Information entered by :: NAllen LPN  Past Medical History:  Diagnosis Date  . Diabetes ([place]   . High cholesterol   . Hypertension    states under control with med., has been on med. x 5 yr.  . Seasonal allergies   . Umbilical hernia 31/0626  Past Surgical History:  Procedure Laterality Date  . INSERTION OF MESH N/A 09/25/2014   Procedure: INSERTION OF MESH;  Surgeon: MDonnie Mesa MD;  [facility]  Service: General;  Laterality: N/A;  . ORIF PATELLA FRACTURE Right 06/16/2008  . TOTAL HIP ARTHROPLASTY Left 12/17/2007  . UMBILICAL HERNIA REPAIR N/A 09/25/2014   Procedure: UMBILICAL HERNIA REPAIR WITH MESH;  Surgeon: MDonnie Mesa MD;  [facility]  Service: General;  Laterality: N/A;   Family History  Problem Relation Age of Onset  . Healthy Mother   . Early death Father    Social History   Socioeconomic History  . Marital status: Legally Separated    Spouse name: Not on file  . Number of children: Not on file  . Years of education: Not on file  . Highest education level: Not on file  Occupational History  . Occupation: retired  Tobacco Use  .  Smoking status: Former Smoker    Packs/day: 0.50    Years: 22.00    Pack years: 11.00    Types: Cigarettes    Quit date: 07/11/2011    Years since quitting: 8.3  . Smokeless tobacco: Never Used  . Tobacco comment: he has quit  Substance and Sexual Activity  . Alcohol use: No  . Drug use: No  . Sexual activity: Yes  Other Topics Concern  . Not on file  Social History Narrative  . Not on file   Social Determinants of Health   Financial Resource Strain: Low Risk   . Difficulty of Paying Living Expenses: Not hard at all  Food  Insecurity: No Food Insecurity  . Worried About Charity fundraiser in the Last Year: Never true  . Ran Out of Food in the Last Year: Never true  Transportation Needs: No Transportation Needs  . Lack of Transportation (Medical): No  . Lack of Transportation (Non-Medical): No  Physical Activity: Sufficiently Active  . Days of Exercise per Week: 3 days  . Minutes of Exercise per Session: 60 min  Stress: No Stress Concern Present  . Feeling of Stress : Not at all  Social Connections:   . Frequency of Communication with Friends and Family:   . Frequency of Social Gatherings with Friends and Family:   . Attends Religious Services:   . Active Member of Clubs or Organizations:   . Attends Archivist Meetings:   [name] Marital Status:     Outpatient Encounter Medications as of 11/07/2019  Medication Sig  . Accu-Chek FastClix Lancets MISC One tab po qd dx; e11.65  . ACCU-CHEK GUIDE test strip Use to check BS qd dx: E11.65  . Arginine 500 MG CAPS Take by mouth.  [name] aspirin 81 MG tablet Take 81 mg by mouth daily.  . cholecalciferol (VITAMIN D) 1000 UNITS tablet Take 1,000 Units by mouth daily.  . Cyanocobalamin (VITAMIN B 12 PO) Take 1 capsule by mouth.  . fluticasone (CUTIVATE) 0.05 % cream Apply topically 2 (two) times daily.  [name] levocetirizine (XYZAL) 5 MG tablet Take 1 tablet (5 mg total) by mouth every evening.  . Multiple Vitamin (MULTIVITAMIN) tablet Take 1 tablet by mouth daily.  [name] olmesartan-hydrochlorothiazide (BENICAR HCT) 20-12.5 MG tablet Take 1 tablet by mouth daily.  . rosuvastatin (CRESTOR) 10 MG tablet TAKE (1) TABLET BY MOUTH DAILY.  [name] SitaGLIPtin-MetFORMIN HCl ([name] XR) 50-1000 MG TB24 Take 1 tablet by mouth 2 (two) times daily.   . tadalafil (CIALIS) 5 MG tablet TAKE 1 TABLET BY MOUTH ONCE A DAY.  . [DISCONTINUED] [name] 50-1000 MG tablet TAKE 1 TABLET BY MOUTH TWICE DAILY WITH A MEAL. (Patient not taking: Reported on 07/01/2019)   No facility-administered  encounter medications on file as of 11/07/2019.    Activities of Daily Living In your present state of health, do you have any difficulty performing the following activities: 11/07/2019  Hearing? N  Vision? N  Difficulty concentrating or making decisions? N  Walking or climbing stairs? N  Dressing or bathing? N  Doing errands, shopping? N  Preparing Food and eating ? N  Using the Toilet? N  In the past six months, have you accidently leaked urine? N  Do you have problems with loss of bowel control? N  Managing your Medications? N  Managing your Finances? N  Housekeeping or managing your Housekeeping? N  Some recent data might be hidden    Patient Care Team: Glendale Chard, MD  as PCP - General (Internal Medicine) Branch, Alphonse Guild, MD as PCP - Cardiology (Cardiology) Rex Kras, Claudette Stapler, RN as Case Manager Adaline Sill, CPhT as [facility] (Pharmacy Technician)   Assessment:   This is a routine wellness examination for Elio.  Exercise Activities and Dietary recommendations Current Exercise Habits: Home exercise routine, Type of exercise: walking, Time (Minutes): 60, Frequency (Times/Week): 3, Weekly Exercise (Minutes/Week): 180  Goals    . "I am going to start walking again" (pt-stated)     Current Barriers:  [name] Knowledge Deficits related to diabetes disease management and Self health management   Nurse Case Manager Clinical Goal(s):  [name] Over the next 30 days, patient will verbalize basic understanding of diabetes disease process and self health management plan as evidenced by patient will verbalize increased knowledge and understanding of meal planning using the plate method and portion control  Goal Met . Over the next 30 days, patient will have a routine exercise regimen in place to include daily walking for 10 min or more per day. Goal Met . 04/18/19 Over the next 30 days patient will verbalize better understanding about how walking and or  exercising routinely can help lower his A1C and improve his overall stamina  CCM RN CM Interventions: 04/18/19 completed call with patient   . Evaluation of current treatment plan related to diabetes and patient's adherence to plan as established by provider. . Provided education to patient re: current A1C of 6.4 obtained on 03/05/19; reviewed target A1C of less than 5.6% to be considered non-diabetic; reiterated how implementing daily exercise can help lower blood sugars; discussed ADA recommendations to exercise 150 minutes per week; discussed patient can start off at less and work his way up depending on his preference and toleration; discussed walking is a good form of exercise; patient confirmed he is walking "about everyday" . Reviewed medications with patient and discussed indication, usage and frequency; discussed patient is having some financial difficulty with med cost; discussed he was denied for financial assistance with [name] due to exceeding the income limit; patient is receiving samples from the office at this time and [name] will continue to follow/assist  . Discussed plans with patient for ongoing care management follow up and provided patient with direct contact information for care management team . Provided patient with printed educational materials related to "Healthy Weigh; Senior Exercise and Fitness" . Advised patient, providing education and rationale, to check cbg daily before meals and record, calling the CCM team and or PCP office for findings outside established parameters.  <80 and or >250  Patient Self Care Activities:  . Self administers medications as prescribed . Attends all scheduled provider appointments . Calls pharmacy for medication refills . Performs ADL's independently . Performs IADL's independently . Calls provider office for new concerns or questions  Please see past updates related to this goal by clicking on the "Past Updates" button  in the selected goal      . Exercise 150 min/wk Moderate Activity    . I would like to apply for financial assistance for my medications (pt-stated)     Current Barriers:  . Non Adherence to prescribed medication regimen and Financial Barriers  Pharmacist Clinical Goal(s):  [name] Over the next 45 days, patient will work with CCM PharmD to address needs related to applying for financial assistance for [name]  Interventions: . Comprehensive medication review performed. . Advised patient to continue taking all medications as prescribed. Reviewed medication  purpose.  Patient denies adverse events.   . Patient states he cannot afford his [name] ($45 copay/month, however patient is now in coverage gap and copay is $110).  He is agreeable to participate in patient assistance program through [employer]. Will prepare documents and leave at front PCP office for patient signature.  We will submit this for the year 2021 . [facility] CPhT, Etter Sjogren following.   . Will continue to provide samples as able and apply for assistance Kandis Fantasia) on Dec 15th, 2020 for the next calendar year 2021.  Patient verbalizes understanding . [employer] 6720 application for [employer] again on 08/07/19 o 09/10/19: [facility] mailed letter of attestation to patient this week.  Instructed patient to bring to me and I will file.  Will provided samples at that time. . Will continue to follow   Patient Self Care Activities:  . Self administers medications as prescribed . Attends all scheduled provider appointments . Calls pharmacy for medication refills . Attends church or other social activities . Performs ADL's independently . Performs IADL's independently . Calls provider office for new concerns or questions  Please see past updates related to this goal by clicking on the "Past Updates" button in the selected goal      . I would like to optimize my medication management of my chronic conditions (pt-stated)     Current Barriers:   [name] Knowledge Deficits related to disease state managment and Non Adherence to prescribed medication regimen  Pharmacist Clinical Goal(s):  [name] Over the next 90 days, patient will work with PharmD to address needs related to optimized  medication management of chronic conditions  Goal updated on 06/12/2019  Interventions: . Comprehensive medication review performed.  Reviewed medication fill history via insurance claims data confirming patient appears compliant with having his medications filled on time as prescribed by provider. . Reviewed & discussed the following diabetes-related information with patient: o Continue checking blood sugars as directed o Follow ADA recommended "diabetes-friendly" diet  (reviewed healthy snack/food options) o Encouraged patient to exercise as able and as recommended by PCP. o Confirmed current DM regimen: [name] 50-1036m XR -->1 tablet by mouth twice daily (samples provided) - Consider switching SGLT2-/metformin combo--pt will consider - 10/02/19-->Difficulty obtaining janumet via [employer] and better outcomes w/ SGLT2s . Reminded patient to mail back letter of attestation with entire application. . Samples left up front for patient o Patient uses Accu Check Guide glucometer.  He checks his blood sugar each morning o Reviewed ALL medication purposes/side effects-->patient denies adverse events, denies hypoglycemia, reports FBG 90s-110s.  Encouraged patient to continue taking all medications as prescribed. o Most recent A1c is 6.7% on 07/02/19 (was 6.5%).  Most recent Scr 1.23 o Antihyperlipidemia regimen: rosuvastatin 129mdaily o Antihypertensive regimen: olmesartan/HCTZ 2087m2.5mg daily  Patient Self Care Activities:  . Self administers medications as prescribed . Attends all scheduled provider appointments . Calls pharmacy for medication refills . Performs ADL's independently . Performs IADL's independently  Please see past updates related to this goal by  clicking on the "Past Updates" button in the selected goal      . Patient Stated     11/07/2019, exercise more       Fall Risk Fall Risk  11/07/2019 03/05/2019 10/30/2018 07/23/2018 04/11/2018  Falls in the past year? 0 0 0 0 No  Risk for fall due to : Medication side effect - - - -  Follow up Falls evaluation completed;Education provided;Falls prevention discussed - - - -   Is  the patient's home free of loose throw rugs in walkways, pet beds, electrical cords, etc?   yes      Grab bars in the bathroom? no      Handrails on the stairs?   n/a      Adequate lighting?   yes  Timed Get Up and Go Performed: n/a  Depression Screen PHQ 2/9 Scores 11/07/2019 10/30/2018 07/23/2018 04/11/2018  PHQ - 2 Score 0 0 0 0  PHQ- 9 Score 0 - - -    Cognitive Function     6CIT Screen 11/07/2019 10/30/2018  What Year? 0 points 0 points  What month? 0 points 0 points  What time? 0 points 0 points  Count back from 20 0 points 0 points  Months in reverse 0 points 0 points  Repeat phrase 2 points 2 points  Total Score 2 2    Immunization History  Administered Date(s) Administered  . Influenza, High Dose Seasonal PF 04/11/2018, 04/01/2019  . Moderna SARS-COVID-2 Vaccination 08/17/2019, 09/17/2019  . Pneumococcal Polysaccharide-23 07/23/2018  . Tdap 04/17/2019    Qualifies for Shingles Vaccine? yes  Screening Tests Health Maintenance  Topic Date Due  . HEMOGLOBIN A1C  12/30/2019  . INFLUENZA VACCINE  02/09/2020  . OPHTHALMOLOGY EXAM  02/19/2020  . FOOT EXAM  11/06/2020  . COLONOSCOPY  04/06/2025  . TETANUS/TDAP  04/16/2029  . COVID-19 Vaccine  Completed  . Hepatitis C Screening  Completed  . PNA vac Low Risk Adult  Completed   Cancer Screenings: Lung: Low Dose CT Chest recommended if Age 80-80 years, 30 pack-year currently smoking OR have quit w/in 15years. Patient does not qualify. Colorectal: up to date  Additional Screenings:  Hepatitis C Screening:11/24/2012      Plan:    Patient  wants to exercise more.  I have personally reviewed and noted the following in the patient's chart:   . Medical and social history . Use of alcohol, tobacco or illicit drugs  . Current medications and supplements . Functional ability and status . Nutritional status . Physical activity . Advanced directives . List of other physicians . Hospitalizations, surgeries, and ER visits in previous 12 months . Vitals . Screenings to include cognitive, depression, and falls . Referrals and appointments  In addition, I have reviewed and discussed with patient certain preventive protocols, quality metrics, and best practice recommendations. A written personalized care plan for preventive services as well as general preventive health recommendations were provided to patient.     Kellie Simmering, LPN  12/22/4313

## 2019-11-07 NOTE — Chronic Care Management (AMB) (Signed)
  Care Management   Note  11/07/2019 Name: [name] MRN: [id number] DOB: 09-15-1950  Andres [name] is a 68 y.o. year old male who is a primary care patient of Glendale Chard, MD and is actively engaged with the care management team. I reached out to [employer] by phone today to assist with scheduling an initial visit with the Pharmacist  Follow up plan: Unsuccessful telephone outreach attempt made. A HIPPA compliant phone message was left for the patient providing contact information and requesting a return call. The care management team will reach out to the patient again over the next 7 days. If patient returns call to provider office, please advise to call [facility]  at [phone].  [place], [place] [address] Direct Dial: [phone] [email or web address]

## 2019-11-07 NOTE — Chronic Care Management (AMB) (Signed)
  Care Management   Note  11/07/2019 Name: Andres [name] MRN: [id number] DOB: 05-31-1951  Andres [name] is a 69 y.o. year old male who is a primary care patient of Glendale Chard, MD and is actively engaged with the care management team. I reached out to [place] by phone today to assist with scheduling an initial visit with the Pharmacist  Follow up plan:  Face to Face appointment with care management team member scheduled for: 11/21/2019  [place], [facility]  [place], [place] [address] Direct Dial: [email or web address] Website: [email or web address]

## 2019-11-07 NOTE — Patient Instructions (Signed)

## 2019-11-07 NOTE — Progress Notes (Signed)
This visit occurred during the SARS-CoV-2 public health emergency.  Safety protocols were in place, including screening questions prior to the visit, additional usage of staff PPE, and extensive cleaning of exam room while observing appropriate contact time as indicated for disinfecting solutions.  Subjective:     Patient ID: Andres [name] , male    DOB: [birth year] , 69 y.o.   MRN: [id number]   Chief Complaint  Patient presents with  . Annual Exam  . Diabetes  . Hypertension    HPI  He is here today for a full physical exam.  He reports compliance with meds. He feels well. Has no complaints. Wants to cut back more on meds if possible.   Diabetes He presents for his follow-up diabetic visit. He has type 2 diabetes mellitus. His disease course has been stable. There are no hypoglycemic associated symptoms. Pertinent negatives for diabetes include no blurred vision and no chest pain. There are no hypoglycemic complications. Diabetic complications include nephropathy. Risk factors for coronary artery disease include diabetes mellitus, dyslipidemia, hypertension and male sex. He is following a diabetic diet. His breakfast blood glucose is taken between 8-9 am. His breakfast blood glucose range is generally 90-110 mg/dl. An ACE inhibitor/angiotensin II receptor blocker is being taken. Eye exam is not current.  Hypertension This is a chronic problem. The current episode started more than 1 year ago. The problem has been gradually improving since onset. The problem is controlled. Pertinent negatives include no blurred vision, chest pain, palpitations or shortness of breath. Past treatments include angiotensin blockers and diuretics. The current treatment provides moderate improvement.     Past Medical History:  Diagnosis Date  . Diabetes ([place])   . High cholesterol   . Hypertension    states under control with med., has been on med. x 5 yr.  . Seasonal allergies   . Umbilical hernia [id number]/7614      Family History  Problem Relation Age of Onset  . Healthy Mother   . Early death Father      Current Outpatient Medications:  .  Accu-Chek FastClix Lancets MISC, One tab po qd dx; e11.65, Disp: 100 each, Rfl: 11 .  ACCU-CHEK GUIDE test strip, Use to check BS qd dx: E11.65, Disp: 100 each, Rfl: 11 .  Arginine 500 MG CAPS, Take by mouth., Disp: , Rfl:  .  aspirin 81 MG tablet, Take 81 mg by mouth daily., Disp: , Rfl:  .  cholecalciferol (VITAMIN D) 1000 UNITS tablet, Take 1,000 Units by mouth daily., Disp: , Rfl:  .  Cyanocobalamin (VITAMIN B 12 PO), Take 1 capsule by mouth., Disp: , Rfl:  .  fluticasone (CUTIVATE) 0.05 % cream, Apply topically 2 (two) times daily., Disp: 60 g, Rfl: 2 .  levocetirizine (XYZAL) 5 MG tablet, Take 1 tablet (5 mg total) by mouth every evening., Disp: 90 tablet, Rfl: 2 .  Multiple Vitamin (MULTIVITAMIN) tablet, Take 1 tablet by mouth daily., Disp: , Rfl:  .  olmesartan-hydrochlorothiazide (BENICAR HCT) 20-12.5 MG tablet, Take 1 tablet by mouth daily., Disp: 90 tablet, Rfl: 2 .  rosuvastatin (CRESTOR) 10 MG tablet, TAKE (1) TABLET BY MOUTH DAILY., Disp: 90 tablet, Rfl: 2 .  SitaGLIPtin-MetFORMIN HCl ([name] XR) 50-1000 MG TB24, Take 1 tablet by mouth 2 (two) times daily. , Disp: , Rfl:  .  tadalafil (CIALIS) 5 MG tablet, TAKE 1 TABLET BY MOUTH ONCE A DAY., Disp: 30 tablet, Rfl: 3   No Known Allergies   Review of Systems  Constitutional: Negative.   HENT: Negative.   Eyes: Negative.  Negative for blurred vision.  Respiratory: Negative.  Negative for shortness of breath.   Cardiovascular: Negative.  Negative for chest pain and palpitations.  Endocrine: Negative.   Genitourinary: Negative.   Musculoskeletal: Negative.   Skin: Negative.   Allergic/Immunologic: Negative.   Neurological: Negative.   Hematological: Negative.   Psychiatric/Behavioral: Negative.      Today's Vitals   11/07/19 1043  BP: 118/82  Pulse: 87  Temp: (!) 97.3 F (36.3 C)   TempSrc: Oral  Weight: 236 lb 9.6 oz (107.3 kg)  Height: 5' 11"  (1.803 m)  PainSc: 0-No pain   Body mass index is 33 kg/m.   Objective:  Physical Exam Vitals and nursing note reviewed.  Constitutional:      Appearance: Normal appearance.  HENT:     Head: Normocephalic and atraumatic.     Right Ear: Ear canal and external ear normal. There is impacted cerumen.     Left Ear: Ear canal and external ear normal. There is impacted cerumen.     Ears:     Comments: Hard, wax in b/l canals - impacted - TM not visualized    Nose:     Comments: Deferred, masked    Mouth/Throat:     Comments: Deferred, masked Eyes:     Extraocular Movements: Extraocular movements intact.     Conjunctiva/sclera: Conjunctivae normal.     Pupils: Pupils are equal, round, and reactive to light.  Cardiovascular:     Rate and Rhythm: Normal rate and regular rhythm.     Pulses: Normal pulses.          Dorsalis pedis pulses are 2+ on the right side and 2+ on the left side.     Heart sounds: Normal heart sounds.  Pulmonary:     Effort: Pulmonary effort is normal.     Breath sounds: Normal breath sounds.  Chest:     Breasts:        Right: Normal. No swelling, bleeding, inverted nipple, mass or nipple discharge.        Left: Normal. No swelling, bleeding, inverted nipple, mass or nipple discharge.  Abdominal:     General: Abdomen is flat. Bowel sounds are normal.     Palpations: Abdomen is soft.  Genitourinary:    Comments: Deferred, per patient Musculoskeletal:        General: Normal range of motion.     Cervical back: Normal range of motion and neck supple.  Feet:     Right foot:     Protective Sensation: 5 sites tested. 5 sites sensed.     Skin integrity: Callus and dry skin present.     Toenail Condition: Right toenails are abnormally thick.     Left foot:     Protective Sensation: 5 sites tested. 5 sites sensed.     Skin integrity: Callus and dry skin present.     Toenail Condition: Left  toenails are abnormally thick.  Skin:    General: Skin is warm.  Neurological:     General: No focal deficit present.     Mental Status: He is alert.  Psychiatric:        Mood and Affect: Mood normal.        Behavior: Behavior normal.         Assessment And Plan:     1. Routine general medical examination at health care facility A full exam was performed.  DRE deferred. He is  followed by Urology for his prostate exams.  PATIENT IS ADVISED TO GET 30-45 MINUTES REGULAR EXERCISE NO LESS THAN FOUR TO FIVE DAYS PER WEEK - BOTH WEIGHTBEARING EXERCISES AND AEROBIC ARE RECOMMENDED.  HE IS ADVISED TO FOLLOW A HEALTHY DIET WITH AT LEAST SIX FRUITS/VEGGIES PER DAY, DECREASE INTAKE OF RED MEAT, AND TO INCREASE FISH INTAKE TO TWO DAYS PER WEEK.  MEATS/FISH SHOULD NOT BE FRIED, BAKED OR BROILED IS PREFERABLE.  I SUGGEST WEARING SPF 50 SUNSCREEN ON EXPOSED PARTS AND ESPECIALLY WHEN IN THE DIRECT SUNLIGHT FOR AN EXTENDED PERIOD OF TIME.  PLEASE AVOID FAST FOOD RESTAURANTS AND INCREASE YOUR WATER INTAKE.   2. Type 2 diabetes mellitus with stage 2 chronic kidney disease, with long-term current use of insulin (HCC)  Chronic. Diabetic foot exam was performed. I DISCUSSED WITH THE PATIENT AT LENGTH REGARDING THE GOALS OF GLYCEMIC CONTROL AND POSSIBLE LONG-TERM COMPLICATIONS.  I  ALSO STRESSED THE IMPORTANCE OF COMPLIANCE WITH HOME GLUCOSE MONITORING, DIETARY RESTRICTIONS INCLUDING AVOIDANCE OF SUGARY DRINKS/PROCESSED FOODS,  ALONG WITH REGULAR EXERCISE.  I  ALSO STRESSED THE IMPORTANCE OF ANNUAL EYE EXAMS, SELF FOOT CARE AND COMPLIANCE WITH OFFICE VISITS.  - CMP14+EGFR - CBC - Lipid panel - Hemoglobin A1c - Referral to Chronic Care Management Services  3. Hypertensive nephropathy  Chronic, well controlled. He will continue with current meds. EKG performed, NSR w/o acute changes. He will rto in six months for re-evaluation.   - EKG 12-Lead - Referral to Chronic Care Management Services  4. Bilateral  impacted cerumen  AFTER OBTAINING VERBAL CONSENT, BOTH EARS WERE FLUSHED BY IRRIGATION. HE TOLERATED PROCEDURE WELL WITHOUT ANY COMPLICATIONS. NO TM ABNORMALITIES WERE NOTED.  - Ear Lavage  5. Class 1 obesity due to excess calories with serious comorbidity and body mass index (BMI) of 33.0 to 33.9 in adult  He is encouraged to strive for BMI less than 30 to decrease cardiac risk. He is encouraged to exercise 30 minutes five days per week and to avoid sugary beverages.   [name], MD    THE PATIENT IS ENCOURAGED TO PRACTICE SOCIAL DISTANCING DUE TO THE COVID-19 PANDEMIC.

## 2019-11-08 ENCOUNTER — Telehealth: Payer: Self-pay | Admitting: Internal Medicine

## 2019-11-08 LAB — CMP14+EGFR
ALT: 21 IU/L (ref 0–44)
AST: 24 IU/L (ref 0–40)
Albumin/Globulin Ratio: 1.3 (ref 1.2–2.2)
Albumin: 4.5 g/dL (ref 3.8–4.8)
Alkaline Phosphatase: 58 IU/L (ref 39–117)
BUN/Creatinine Ratio: 10 (ref 10–24)
BUN: 13 mg/dL (ref 8–27)
Bilirubin Total: 0.5 mg/dL (ref 0.0–1.2)
CO2: 24 mmol/L (ref 20–29)
Calcium: 9.8 mg/dL (ref 8.6–10.2)
Chloride: 99 mmol/L (ref 96–106)
Creatinine, Ser: 1.3 mg/dL — ABNORMAL HIGH (ref 0.76–1.27)
GFR calc Af Amer: 66 mL/min/{1.73_m2} (ref >59)
GFR calc non Af Amer: 57 mL/min/{1.73_m2} — ABNORMAL LOW (ref >59)
Globulin, Total: 3.5 g/dL (ref 1.5–4.5)
Glucose: 97 mg/dL (ref 65–99)
Potassium: 4.9 mmol/L (ref 3.5–5.2)
Sodium: 139 mmol/L (ref 134–144)
Total Protein: 8 g/dL (ref 6.0–8.5)

## 2019-11-08 LAB — CBC
Hematocrit: 41.1 % (ref 37.5–51.0)
Hemoglobin: 13.7 g/dL (ref 13.0–17.7)
MCH: 30.3 pg (ref 26.6–33.0)
MCHC: 33.3 g/dL (ref 31.5–35.7)
MCV: 91 fL (ref 79–97)
Platelets: 222 10*3/uL (ref 150–450)
RBC: 4.52 x10E6/uL (ref 4.14–5.80)
RDW: 12.7 % (ref 11.6–15.4)
WBC: 8.2 10*3/uL (ref 3.4–10.8)

## 2019-11-08 LAB — HEMOGLOBIN A1C
Est. average glucose Bld gHb Est-mCnc: 143 mg/dL
Hgb A1c MFr Bld: 6.6 % — ABNORMAL HIGH (ref 4.8–5.6)

## 2019-11-08 LAB — LIPID PANEL
Chol/HDL Ratio: 2.7 ratio (ref 0.0–5.0)
Cholesterol, Total: 82 mg/dL — ABNORMAL LOW (ref 100–199)
HDL: 30 mg/dL — ABNORMAL LOW (ref >39)
LDL Chol Calc (NIH): 32 mg/dL (ref 0–99)
Triglycerides: 109 mg/dL (ref 0–149)
VLDL Cholesterol Cal: 20 mg/dL (ref 5–40)

## 2019-11-08 NOTE — Chronic Care Management (AMB) (Signed)
  Care Management   Note  11/08/2019 Name: Andres [name] MRN: [id number] DOB: 1951/03/08  Andres [name] is a 69 y.o. year old male who is a primary care patient of Andres Chard, MD and is actively engaged with the care management team. I reached out to [facility] by phone today to assist with re-scheduling an initial visit with the Pharmacist. Called to make patient aware to bring all patient assistance paperwork for medication assistance and all of his medications that he takes (including over the counter supplements) per Pharmacist.   Follow up plan: Face to Face appointment with care management team member scheduled for: 11/25/2019  [facility], [facility]  [place], [facility] [address] Direct Dial: Andres [name] Website: [place] [facility]

## 2019-11-21 ENCOUNTER — Ambulatory Visit: Payer: Medicare HMO

## 2019-11-25 ENCOUNTER — Ambulatory Visit: Payer: Medicare HMO

## 2019-11-25 ENCOUNTER — Other Ambulatory Visit: Payer: Self-pay

## 2019-11-25 DIAGNOSIS — E1122 Type 2 diabetes mellitus with diabetic chronic kidney disease: Secondary | ICD-10-CM

## 2019-11-25 DIAGNOSIS — N182 Chronic kidney disease, stage 2 (mild): Secondary | ICD-10-CM

## 2019-11-25 DIAGNOSIS — I129 Hypertensive chronic kidney disease with stage 1 through stage 4 chronic kidney disease, or unspecified chronic kidney disease: Secondary | ICD-10-CM

## 2019-11-25 DIAGNOSIS — E7800 Pure hypercholesterolemia, unspecified: Secondary | ICD-10-CM

## 2019-11-25 NOTE — Patient Instructions (Signed)
Visit Information  Goals Addressed            This Visit's Progress   . Initial Care Plan       CARE PLAN ENTRY  Current Barriers:  . Chronic Disease Management support, education, and care coordination needs related to Hypertension, Hyperlipidemia, Diabetes, and Chronic Kidney Disease   Hypertension . Pharmacist Clinical Goal(s): o Over the next 90 days, patient will work with PharmD and providers to maintain BP goal <130/80 . Current regimen:  o Olmesartan/HCTZ 20/12.5mg  daily . Interventions: o Recommend patient check blood pressure periodically and if symptomatic and record for provider . Patient self care activities - Over the next 90 days, patient will: o Check BP periodically and if symptomatic, document, and provide at future appointments o Ensure daily salt intake < 2300 mg/day  Hyperlipidemia . Pharmacist Clinical Goal(s): o Over the next 90 days, patient will work with PharmD and providers to maintain LDL goal < 70 . Current regimen:  o Rosuvastatin 10mg  daily, 6 days per week . Interventions: o Will discuss with PCP decreasing to rosuvastatin 5mg  daily to help with medication compliance.  o Recommended increase in healthy fats such as avocados, walnuts, flaxseed, etc to help increase HDL. Exercise will also help improve HDL.  . Patient self care activities - Over the next 90 days, patient will: o Take medications daily as directed o Increase dietary intake of healthy fats and increase exercise  Diabetes . Pharmacist Clinical Goal(s): o Over the next 90 days, patient will work with PharmD and providers to maintain A1c goal <7% . Current regimen:  o [name] XR 50-1000mg  twice daily . Interventions: o Will assist with patient assistance application for [name] through [employer] Patient self care activities - Over the next 90 days, patient will: o Check blood sugar at bedtime, document, and provide at future appointments o Contact provider with any episodes of  hypoglycemia  Medication management . Pharmacist Clinical Goal(s): o Over the next 90 days, patient will work with PharmD and providers to achieve optimal medication adherence . Current pharmacy: [facility] . Interventions o Comprehensive medication review performed. o Continue current medication management strategy o Provided information regarding medication cost and mail order option . Patient self care activities - Over the next 90 days, patient will: o Take medications as prescribed o Report any questions or concerns to PharmD and/or provider(s)  Initial goal documentation        Mr. Reaser was given information about Chronic Care Management services today including:  1. CCM service includes personalized support from designated clinical staff supervised by his physician, including individualized plan of care and coordination with other care providers 2. 24/7 contact phone numbers for assistance for urgent and routine care needs. 3. Standard insurance, coinsurance, copays and deductibles apply for chronic care management only during months in which we provide at least 20 minutes of these services. Most insurances cover these services at 100%, however patients may be responsible for any copay, coinsurance and/or deductible if applicable. This service may help you avoid the need for more expensive face-to-face services. 4. Only one practitioner may furnish and bill the service in a calendar month. 5. The patient may stop CCM services at any time (effective at the end of the month) by phone call to the office staff.  Patient agreed to services and verbal consent obtained.   The patient verbalized understanding of instructions provided today and agreed to receive a mailed copy of patient instruction and/or educational materials. Telephone  follow up appointment with pharmacy team member scheduled for: 02/25/20 @ 4 PM  Jannette Fogo, PharmD Clinical Pharmacist [facility] [phone]   Diabetes Mellitus and Nutrition, Adult When you have diabetes (diabetes mellitus), it is very important to have healthy eating habits because your blood sugar (glucose) levels are greatly affected by what you eat and drink. Eating healthy foods in the appropriate amounts, at about the same times every day, can help you:  Control your blood glucose.  Lower your risk of heart disease.  Improve your blood pressure.  Reach or maintain a healthy weight. Every person with diabetes is different, and each person has different needs for a meal plan. Your health care provider may recommend that you work with a diet and nutrition specialist (dietitian) to make a meal plan that is best for you. Your meal plan may vary depending on factors such as:  The calories you need.  The medicines you take.  Your weight.  Your blood glucose, blood pressure, and cholesterol levels.  Your activity level.  Other health conditions you have, such as heart or kidney disease. How do carbohydrates affect me? Carbohydrates, also called carbs, affect your blood glucose level more than any other type of food. Eating carbs naturally raises the amount of glucose in your blood. Carb counting is a method for keeping track of how many carbs you eat. Counting carbs is important to keep your blood glucose at a healthy level, especially if you use insulin or take certain oral diabetes medicines. It is important to know how many carbs you can safely have in each meal. This is different for every person. Your dietitian can help you calculate how many carbs you should have at each meal and for each snack. Foods that contain carbs include:  Bread, cereal, rice, pasta, and crackers.  Potatoes and corn.  Peas, beans, and lentils.  Milk and yogurt.  Fruit and juice.  Desserts, such as cakes, cookies, ice cream, and candy. How does alcohol affect me? Alcohol can cause a sudden decrease in  blood glucose (hypoglycemia), especially if you use insulin or take certain oral diabetes medicines. Hypoglycemia can be a life-threatening condition. Symptoms of hypoglycemia (sleepiness, dizziness, and confusion) are similar to symptoms of having too much alcohol. If your health care provider says that alcohol is safe for you, follow these guidelines:  Limit alcohol intake to no more than 1 drink per day for nonpregnant women and 2 drinks per day for men. One drink equals 12 oz of beer, 5 oz of wine, or 1 oz of hard liquor.  Do not drink on an empty stomach.  Keep yourself hydrated with water, diet soda, or unsweetened iced tea.  Keep in mind that regular soda, juice, and other mixers may contain a lot of sugar and must be counted as carbs. What are tips for following this plan?  Reading food labels  Start by checking the serving size on the "Nutrition Facts" label of packaged foods and drinks. The amount of calories, carbs, fats, and other nutrients listed on the label is based on one serving of the item. Many items contain more than one serving per package.  Check the total grams (g) of carbs in one serving. You can calculate the number of servings of carbs in one serving by dividing the total carbs by 15. For example, if a food has 30 g of total carbs, it would be equal to 2 servings of carbs.  Check the number  of grams (g) of saturated and trans fats in one serving. Choose foods that have low or no amount of these fats.  Check the number of milligrams (mg) of salt (sodium) in one serving. Most people should limit total sodium intake to less than 2,300 mg per day.  Always check the nutrition information of foods labeled as "low-fat" or "nonfat". These foods may be higher in added sugar or refined carbs and should be avoided.  Talk to your dietitian to identify your daily goals for nutrients listed on the label. Shopping  Avoid buying canned, premade, or processed foods. These foods  tend to be high in fat, sodium, and added sugar.  Shop around the outside edge of the grocery store. This includes fresh fruits and vegetables, bulk grains, fresh meats, and fresh dairy. Cooking  Use low-heat cooking methods, such as baking, instead of high-heat cooking methods like deep frying.  Cook using healthy oils, such as olive, canola, or sunflower oil.  Avoid cooking with butter, cream, or high-fat meats. Meal planning  Eat meals and snacks regularly, preferably at the same times every day. Avoid going long periods of time without eating.  Eat foods high in fiber, such as fresh fruits, vegetables, beans, and whole grains. Talk to your dietitian about how many servings of carbs you can eat at each meal.  Eat 4-6 ounces (oz) of lean protein each day, such as lean meat, chicken, fish, eggs, or tofu. One oz of lean protein is equal to: ? 1 oz of meat, chicken, or fish. ? 1 egg. ?  cup of tofu.  Eat some foods each day that contain healthy fats, such as avocado, nuts, seeds, and fish. Lifestyle  Check your blood glucose regularly.  Exercise regularly as told by your health care provider. This may include: ? 150 minutes of moderate-intensity or vigorous-intensity exercise each week. This could be brisk walking, biking, or water aerobics. ? Stretching and doing strength exercises, such as yoga or weightlifting, at least 2 times a week.  Take medicines as told by your health care provider.  Do not use any products that contain nicotine or tobacco, such as cigarettes and e-cigarettes. If you need help quitting, ask your health care provider.  Work with a Social worker or diabetes educator to identify strategies to manage stress and any emotional and social challenges. Questions to ask a health care provider  Do I need to meet with a diabetes educator?  Do I need to meet with a dietitian?  What number can I call if I have questions?  When are the best times to check my blood  glucose? Where to find more information:  American Diabetes Association: diabetes.org  Academy of Nutrition and Dietetics: www.eatright.[employer] of Diabetes and Digestive and Kidney Diseases (NIH): [employer] Summary  A healthy meal plan will help you control your blood glucose and maintain a healthy lifestyle.  Working with a diet and nutrition specialist (dietitian) can help you make a meal plan that is best for you.  Keep in mind that carbohydrates (carbs) and alcohol have immediate effects on your blood glucose levels. It is important to count carbs and to use alcohol carefully. This information is not intended to replace advice given to you by your health care provider. Make sure you discuss any questions you have with your health care provider. Document Revised: 06/09/2017 Document Reviewed: 08/01/2016 Elsevier Patient Education  2020 [employer].

## 2019-11-25 NOTE — Chronic Care Management (AMB) (Signed)
Chronic Care Management Pharmacy  Name: Andres [name]  MRN: [id number] DOB: [birth year]  Chief Complaint/ HPI  Andres [name],  69 y.o. , male presents for their Initial CCM visit with the clinical pharmacist In office.  PCP : Glendale Chard, MD  Their chronic conditions include: Hypertension, Diabetes with stage 2 CKD, Pure hypercholesterolemia  Office Visits: 11/07/19 AWV and OV: Annual exam. Diabetic foot exam performed. Labs ordered (CMP14+EGFR, CBC, HgbA1c, Lipid panel). Referred to CCM. EKG showed no acute changes. Bilateral impacted cerumen flushed by irrigation. Pt instructed to decrease rosuvastatin 77m to 6 days per week (skip Sundays) due to cholesterol being on the low end.   06/12/19 OV: Diabetic follow up visit. Labs ordered (BMP8+EGFR, HgbA1c). Importance of medication compliance discussed. Healthy diet and exercise encouraged. Will switch to [name] XR [phone] once daily once patient runs out of current supply of [name] XR 50-10056m   Consult Visit: N/A  CCM Encounters: 10/02/19 PharmD: Difficulty obtaining [name] XR from [employer] that pt needs to mail back attestation form. Samples of [name] XR given.   09/11/19 PharmD: Comprehensive medication review performed. Instructed pt to bring attestation form to the office so that PharmD could mail it.   09/04/19 PharmD: Medication fill history reviewed. Samples of [name] XR provided. Dietary recommendations given.   08/07/19 PharmD: [name] assistance mailed to [employer] again.   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   Medications: Outpatient Encounter Medications as of 11/25/2019  Medication Sig Note  . Accu-Chek FastClix Lancets MISC One tab po qd dx; e11.65   . ACCU-CHEK GUIDE test strip Use to check BS qd dx: E11.65   . Arginine 500 MG CAPS Take by mouth. Daily   . aspirin 81 MG tablet Take 81 mg by mouth daily.   . cholecalciferol (VITAMIN  D) 1000 UNITS tablet Take 1,000 Units by mouth daily.   . Cyanocobalamin (VITAMIN B 12 PO) Take 1 capsule by mouth. 200016mdaily   . fluticasone (CUTIVATE) 0.05 % cream Apply topically 2 (two) times daily. 11/25/2019: As needed for rash  . levocetirizine (XYZAL) 5 MG tablet Take 1 tablet (5 mg total) by mouth every evening.   . Multiple Vitamin (MULTIVITAMIN) tablet Take 1 tablet by mouth daily.   . oMarland Kitchenmesartan-hydrochlorothiazide (BENICAR HCT) 20-12.5 MG tablet Take 1 tablet by mouth daily.   . rosuvastatin (CRESTOR) 10 MG tablet TAKE (1) TABLET BY MOUTH DAILY. 11/25/2019: 6 days per week  . SitaGLIPtin-MetFORMIN HCl ([name] XR) 50-1000 MG TB24 Take 1 tablet by mouth 2 (two) times daily.    . tadalafil (CIALIS) 5 MG tablet TAKE 1 TABLET BY MOUTH ONCE A DAY.    No facility-administered encounter medications on file as of 11/25/2019.   Current Diagnosis/Assessment:    SDOH Interventions     Most Recent Value  SDOH Interventions  SDOH Interventions for the Following Domains  Financial Strain  Financial Strain Interventions  Other (Comment) [Assisted with patient assistance application for [name]]      Goals Addressed            This Visit's Progress   . Initial Care Plan       CARE PLAN ENTRY  Current Barriers:  . Chronic Disease Management support, education, and care coordination needs related to Hypertension, Hyperlipidemia, Diabetes, and Chronic Kidney Disease   Hypertension . Pharmacist Clinical Goal(s): o Over the next 90 days, patient will work with PharmD and providers to maintain BP goal <  130/80 . Current regimen:  o Olmesartan/HCTZ 20/12.61m daily . Interventions: o Recommend patient check blood pressure periodically and if symptomatic and record for provider . Patient self care activities - Over the next 90 days, patient will: o Check BP periodically and if symptomatic, document, and provide at future appointments o Ensure daily salt intake < 2300  mg/day  Hyperlipidemia . Pharmacist Clinical Goal(s): o Over the next 90 days, patient will work with PharmD and providers to maintain LDL goal < 70 . Current regimen:  o Rosuvastatin 151mdaily, 6 days per week . Interventions: o Will discuss with PCP decreasing to rosuvastatin 64m36maily to help with medication compliance.  o Recommended increase in healthy fats such as avocados, walnuts, flaxseed, etc to help increase HDL. Exercise will also help improve HDL.  . Patient self care activities - Over the next 90 days, patient will: o Take medications daily as directed o Increase dietary intake of healthy fats and increase exercise  Diabetes . Pharmacist Clinical Goal(s): o Over the next 90 days, patient will work with PharmD and providers to maintain A1c goal <7% . Current regimen:  o [name] XR 50-1000m41mice daily . Interventions: o Will assist with patient assistance application for [name] through [employer] Patient self care activities - Over the next 90 days, patient will: o Check blood sugar at bedtime, document, and provide at future appointments o Contact provider with any episodes of hypoglycemia  Medication management . Pharmacist Clinical Goal(s): o Over the next 90 days, patient will work with PharmD and providers to achieve optimal medication adherence . [facility] o Comprehensive medication review performed. o Continue current medication management strategy o Provided information regarding medication cost and mail order option . Patient self care activities - Over the next 90 days, patient will: o Take medications as prescribed o Report any questions or concerns to PharmD and/or provider(s)  Initial goal documentation        Diabetes   Recent Relevant Labs: Lab Results  Component Value Date/Time   HGBA1C 6.6 (H) 11/07/2019 12:03 PM   HGBA1C 6.7 (H) 07/01/2019 11:51 AM   HGBA1C 7.7 02/13/2018 12:00 AM   MICROALBUR  30 11/07/2019 11:30 AM   MICROALBUR 10 10/30/2018 10:29 AM    Kidney Function Lab Results  Component Value Date/Time   CREATININE 1.30 (H) 11/07/2019 12:03 PM   CREATININE 1.23 07/01/2019 11:51 AM   GFRNONAA 57 (L) 11/07/2019 12:03 PM   GFRAA 66 11/07/2019 12:03 PM   Checking BG: Daily in the evening  Recent HS BG readings: 109,294,765,465ient has failed these meds in past: [place] Patient is currently controlled on the following medications:  -[name] XR 50-1000mg44mce daily  Last diabetic Foot exam: 11/07/19  Last diabetic Eye exam: 02/19/19 Lab Results  Component Value Date/Time   HMDIABEYEEXA No Retinopathy 02/19/2019 12:00 AM    We discussed: -Denies symptoms/episodes of hypoglycemia/hyperglycemia on current treatment regimen -Diet and exercise extensively  Fish, chicken, pork, salads  Some biscuits and "light" bread, tries to limit  "Not as many vegetables as he needs to eat"  Encouraged a well balanced diet limiting carbs  Pt reports he enjoys walking with friends 3 times weekly for  about 1.5 hours (this has been limited due to COVID restrictions,  but he is going to start back)  Encourage 150 minutes of exercise weekly -[name] patient assistance application  Plan -Continue current medications  -Clarify current medication regimen with PCP (XR vs IR and strength) -  Mail in patient assistance application once completed -Samples of [name] XR 50/1000 mg given in office today  Hypertension   Office blood pressures are  BP Readings from Last 3 Encounters:  11/07/19 118/82  11/07/19 118/82  07/01/19 110/82   Patient has failed these meds in the past: Lisinopril, Lisinopril/HCTZ,  Patient is currently controlled on the following medications:  -Olmesartan/HCTZ 20/12.32m daily  Patient checks BP at home infrequently  Patient home BP readings are ranging: N/A  We discussed: -Diet and exercise extensively  Pt does not add extra salt to food, prefers using  pepper -Recommended pt check BP periodically and if symptomatic  Plan Continue current medications   Hyperlipidemia   Lipid Panel     Component Value Date/Time   CHOL 82 (L) 11/07/2019 1203   TRIG 109 11/07/2019 1203   HDL 30 (L) 11/07/2019 1203   CHOLHDL 2.7 11/07/2019 1203   LDLCALC 32 11/07/2019 1203   LABVLDL 20 11/07/2019 1203     The ASCVD Risk score (Goff DC Jr., et al., 2013) failed to calculate for the following reasons:   The valid total cholesterol range is 130 to 320 mg/dL   Patient has failed these meds in past: N/A Patient is currently controlled on the following medications:  -Rosuvastatin 166mdaily -Aspirin 8146maily  We discussed:  -Diet and exercise extensively  HDL is below goal (Goal >40). Encouraged pt to increase  consumption of healthy fats (i.e.. Avocados, walnuts, flaxseed)  and increase physical activity -PCP decreased rosuvastatin 32m76m 6 days per week due to very low LDL. Will discuss a decrease in dose to 5mg 85m take daily to improve/ensure compliance.   Plan -Continue current medications  -Discuss cholesterol treatment regimen with PCP  Medication Management   [facility] Uses pill box? No - Only uses a pill box when traveling Pt endorses 95% compliance and states that he only forgets occasionally but tries to take the most important medications everyday (BP and diabetes meds)  We discussed:  -Importance of taking medications everyday as directed -Discussed cost comparison of medications at retail pharmacies versus mail order  Plan Continue current medication management strategy   Follow up: 3 month phone visit  CourtJannette FogormD Clinical Pharmacist [facility] [phone]

## 2019-11-26 ENCOUNTER — Other Ambulatory Visit: Payer: Self-pay | Admitting: Pharmacy Technician

## 2019-11-26 NOTE — Patient Outreach (Signed)
[facility])   11/26/2019  Andres [name] 1950-08-12 [id number]   Follow up call placed to [employer] regarding patient assistance application(s) for [name] confirms patient has not returned attestation form that was mailed out to him.   Follow up:  Will remove myself from care team due to patient not returning required form to company.  [name] Chana Bode, [place] Certified Pharmacy Technician [facility]

## 2019-11-28 ENCOUNTER — Telehealth: Payer: Self-pay

## 2019-11-28 ENCOUNTER — Ambulatory Visit: Payer: Self-pay

## 2019-11-28 ENCOUNTER — Other Ambulatory Visit: Payer: Self-pay

## 2019-11-28 DIAGNOSIS — E7800 Pure hypercholesterolemia, unspecified: Secondary | ICD-10-CM

## 2019-11-28 DIAGNOSIS — E1122 Type 2 diabetes mellitus with diabetic chronic kidney disease: Secondary | ICD-10-CM

## 2019-11-28 DIAGNOSIS — I129 Hypertensive chronic kidney disease with stage 1 through stage 4 chronic kidney disease, or unspecified chronic kidney disease: Secondary | ICD-10-CM

## 2019-11-29 NOTE — Chronic Care Management (AMB) (Signed)
  Chronic Care Management   Outreach Note  11/29/2019 Name: Andres [name] MRN: [id number] DOB: 01-10-1951  Referred by: Glendale Chard, MD Reason for referral : Chronic Care Management (FU RNCM Call )   An unsuccessful telephone outreach was attempted today. The patient was referred to the case management team for assistance with care management and care coordination.   Follow Up Plan: Telephone follow up appointment with care management team member scheduled for: 12/27/19  Barb Merino, RN, BSN, CCM Care Management Coordinator [facility]  Direct Phone: [phone]

## 2019-12-17 ENCOUNTER — Other Ambulatory Visit: Payer: Self-pay | Admitting: Internal Medicine

## 2019-12-17 DIAGNOSIS — N529 Male erectile dysfunction, unspecified: Secondary | ICD-10-CM

## 2019-12-27 ENCOUNTER — Telehealth: Payer: Self-pay

## 2020-01-15 ENCOUNTER — Other Ambulatory Visit: Payer: Self-pay

## 2020-01-15 ENCOUNTER — Telehealth: Payer: Self-pay | Admitting: Internal Medicine

## 2020-01-15 ENCOUNTER — Ambulatory Visit: Payer: Medicare HMO

## 2020-01-15 DIAGNOSIS — E7800 Pure hypercholesterolemia, unspecified: Secondary | ICD-10-CM

## 2020-01-15 DIAGNOSIS — E1122 Type 2 diabetes mellitus with diabetic chronic kidney disease: Secondary | ICD-10-CM

## 2020-01-15 MED ORDER — JANUMET XR 50-1000 MG PO TB24: 1.0000 | ORAL_TABLET | Freq: Two times a day (BID) | ORAL | 1 refills | Status: DC

## 2020-01-15 NOTE — Chronic Care Management (AMB) (Signed)
  Chronic Care Management   Note  01/15/2020 Name: Andres [name] MRN: [id number] DOB: 09/15/1950  Andres [name] is a 69 y.o. year old male who is a primary care patient of Glendale Chard, MD and is actively engaged with the care management team. Andres [name] called by phone today to assist with medication  question. Patient received [name] in mail today 01/15/2020 however, Andres [name] wanted to make pharmacist aware that medication received was not an [name] XR. Made Andres [name] aware and scheduled a follow up visit with the Pharmacist.  Follow up plan: Telephone appointment with care management team member scheduled for:01/15/2020.  [place], [place] [address] Direct Dial: [phone] Andres [name] Website: [email or web address]

## 2020-01-20 ENCOUNTER — Ambulatory Visit: Payer: Self-pay

## 2020-01-20 ENCOUNTER — Other Ambulatory Visit: Payer: Self-pay | Admitting: Internal Medicine

## 2020-01-20 DIAGNOSIS — E1122 Type 2 diabetes mellitus with diabetic chronic kidney disease: Secondary | ICD-10-CM

## 2020-01-20 DIAGNOSIS — E7800 Pure hypercholesterolemia, unspecified: Secondary | ICD-10-CM

## 2020-01-27 DIAGNOSIS — N400 Enlarged prostate without lower urinary tract symptoms: Secondary | ICD-10-CM | POA: Diagnosis not present

## 2020-02-06 ENCOUNTER — Ambulatory Visit: Payer: Self-pay

## 2020-02-06 NOTE — Progress Notes (Signed)
Encounter opened in error

## 2020-02-06 NOTE — Patient Instructions (Signed)
Visit Information  Goals Addressed            This Visit's Progress   . [name] Patient Assistance       CARE PLAN ENTRY (see longitudinal plan of care for additional care plan information)  Current Barriers:  . Financial Barriers: patient has [employer] and reports copay for [name] XR is cost prohibitive at this time  Pharmacist Clinical Goal(s):  [name] Over the next 30 days, patient will work with PharmD and providers to relieve medication access concerns  Interventions: . Comprehensive medication review completed; medication list updated in electronic medical record.  Bertram Savin care team collaboration (see longitudinal plan of care) . [name] XR by [employer]: Patient meets income criteria for this medication's patient assistance program. Reviewed application process. Completed application has been submitted to [employer] patient assistance program. . [employer] has mailed patient an attestation form to complete and sign along with original application which was missing PCP state license number . Will add state license number and mail application along with completed attestation form once patient returns it to the office . Obtained a free voucher from [employer]. Called and provided voucher information to [place] and sent in prescription for [name] XR  Patient Self Care Activities:  . Patient will complete and sign attestation form and return to the office along with original application . Pick up one month supply of [name] XR for free at [facility]  . Obtain samples from the office if needed  Initial goal documentation        Patient verbalizes understanding of instructions provided today.   Telephone follow up appointment with pharmacy team member scheduled for: 02/25/20 @ 4:00 PM  Jannette Fogo, PharmD Clinical Pharmacist [facility] [phone]

## 2020-02-06 NOTE — Chronic Care Management (AMB) (Signed)
   Chronic Care Management Pharmacy  Name: Andres [name]  MRN: [id number] DOB: 08-30-1950  Chief Complaint/ HPI  Andres [name],  69 y.o. , male presented in office today to return [employer] patient assistance documents  PCP : Glendale Chard, MD  Their chronic conditions include: Hypertension, Diabetes with stage 2 CKD, Pure hypercholesterolemia  Current Diagnosis/Assessment:  SDOH Interventions     Most Recent Value  SDOH Interventions  Financial Strain Interventions Other (Comment)  [Completed [name] XR patient assistance application and attestation form mailed to [place] Addressed            This Visit's Progress   . [name] Patient Assistance       CARE PLAN ENTRY (see longitudinal plan of care for additional care plan information)  Current Barriers:  . Financial Barriers: patient has [employer] and reports copay for [name] XR is cost prohibitive at this time  Pharmacist Clinical Goal(s):  [name] Over the next 30 days, patient will work with PharmD and providers to relieve medication access concerns  Interventions: . Comprehensive medication review completed; medication list updated in electronic medical record.  Bertram Savin care team collaboration (see longitudinal plan of care) . [name] XR by [employer]: Patient meets income criteria for this medication's patient assistance program. Reviewed application process. Completed application was submitted to [employer] patient assistance program. Attestation form was mailed to patient by [employer] along with original application.  [place] license number for PCP added to original application once returned to office by patient. Mailed completed application along with completed and signed attestation form to [employer] patient assistance program  Patient Self Care Activities:  . Obtained samples of [name] XR from the office today  Please see past updates related to this goal by clicking on the "Past Updates" button in the  selected goal        Patient returned original application with completed and signed attestation form for [name] XR patient assistance. Added PCP state license number to application and mailed completed application and attestation form to [employer] patient assistance program. Provided pt with samples of [name] XR.   Follow up: 1 month phone visit  Jannette Fogo, PharmD Clinical Pharmacist [facility] [phone]

## 2020-02-06 NOTE — Patient Instructions (Signed)
Visit Information  Goals Addressed            This Visit's Progress   . [name] Patient Assistance       CARE PLAN ENTRY (see longitudinal plan of care for additional care plan information)  Current Barriers:  . Financial Barriers: patient has [employer] and reports copay for [name] XR is cost prohibitive at this time  Pharmacist Clinical Goal(s):  [name] Over the next 30 days, patient will work with PharmD and providers to relieve medication access concerns  Interventions: . Comprehensive medication review completed; medication list updated in electronic medical record.  Bertram Savin care team collaboration (see longitudinal plan of care) . [name] XR by [employer]: Patient meets income criteria for this medication's patient assistance program. Reviewed application process. Completed application was submitted to [employer] patient assistance program. Attestation form was mailed to patient by [employer] along with original application.  [place] license number for PCP added to original application once returned to office by patient. Mailed completed application along with completed and signed attestation form to [employer] patient assistance program  Patient Self Care Activities:  . Obtained samples of [name] XR from the office today  Please see past updates related to this goal by clicking on the "Past Updates" button in the selected goal         Patient verbalizes understanding of instructions provided today.   Telephone follow up appointment with pharmacy team member scheduled for: 02/25/20 @ 4:00 PM  Jannette Fogo, PharmD Clinical Pharmacist [facility] [phone]

## 2020-02-06 NOTE — Chronic Care Management (AMB) (Signed)
Chronic Care Management Pharmacy  Name: Andres [name]  MRN: [id number] DOB: 08-Aug-1950  Chief Complaint/ HPI  Andres [name],  69 y.o. , male called today to notify CCM pharmacist that he received the wrong [name] from [employer] patient assistance (it was not extended release).  PCP : Glendale Chard, MD  Their chronic conditions include: Hypertension, Diabetes with stage 2 CKD, Pure hypercholesterolemia  Office Visits: 11/07/19 AWV and OV: Annual exam. Diabetic foot exam performed. Labs ordered (CMP14+EGFR, CBC, HgbA1c, Lipid panel). Referred to CCM. EKG showed no acute changes. Bilateral impacted cerumen flushed by irrigation. Pt instructed to decrease rosuvastatin 70m to 6 days per week (skip Sundays) due to cholesterol being on the low end.   06/12/19 OV: Diabetic follow up visit. Labs ordered (BMP8+EGFR, HgbA1c). Importance of medication compliance discussed. Healthy diet and exercise encouraged. Will switch to [name] XR [phone] once daily once patient runs out of current supply of [name] XR 50-1002m   Consult Visit: N/A  CCM Encounters: 10/02/19 PharmD: Difficulty obtaining [name] XR from [place]DIRECTVdiscussed that pt needs to mail back attestation form. Samples of [name] XR given.   09/11/19 PharmD: Comprehensive medication review performed. Instructed pt to bring attestation form to the office so that PharmD could mail it.   09/04/19 PharmD: Medication fill history reviewed. Samples of [name] XR provided. Dietary recommendations given.   08/07/19 PharmD: [name] assistance mailed to [employer] again.   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   Medications: Outpatient Encounter Medications as of 01/15/2020  Medication Sig Note  . Accu-Chek FastClix Lancets MISC One tab po qd dx; e11.65   . ACCU-CHEK GUIDE test strip Use to check BS qd dx: E11.65   . Arginine 500 MG CAPS Take by mouth. Daily   . aspirin 81 MG tablet  Take 81 mg by mouth daily.   . cholecalciferol (VITAMIN D) 1000 UNITS tablet Take 1,000 Units by mouth daily.   . Cyanocobalamin (VITAMIN B 12 PO) Take 1 capsule by mouth. 200016mdaily   . fluticasone (CUTIVATE) 0.05 % cream Apply topically 2 (two) times daily. 11/25/2019: As needed for rash  . levocetirizine (XYZAL) 5 MG tablet Take 1 tablet (5 mg total) by mouth every evening.   . Multiple Vitamin (MULTIVITAMIN) tablet Take 1 tablet by mouth daily.   . rosuvastatin (CRESTOR) 10 MG tablet TAKE (1) TABLET BY MOUTH DAILY. 11/25/2019: 6 days per week  . tadalafil (CIALIS) 5 MG tablet TAKE 1 TABLET BY MOUTH ONCE A DAY.   . [DISCONTINUED] olmesartan-hydrochlorothiazide (BENICAR HCT) 20-12.5 MG tablet Take 1 tablet by mouth daily.   . [DISCONTINUED] SitaGLIPtin-MetFORMIN HCl ([name] XR) 50-1000 MG TB24 Take 1 tablet by mouth 2 (two) times daily.     No facility-administered encounter medications on file as of 01/15/2020.   Current Diagnosis/Assessment:  SDOH Interventions     Most Recent Value  SDOH Interventions  Financial Strain Interventions Other (Comment)  [Assist patient with completing [name] XR patient [place] application through [place]   SDOH Interventions     Most Recent Value  SDOH Interventions  Financial Strain Interventions Other (Comment)  [Assist patient with completing [name] XR [place]         This Visit's Progress   . [name] Patient Assistance       CARE PLAN ENTRY (see longitudinal plan of care for additional care plan  information)  Current Barriers:  . Financial Barriers: patient has [employer] and reports copay for [name] XR is cost prohibitive at this time  Pharmacist Clinical Goal(s):  [name] Over the next 30 days, patient will work with PharmD and providers to relieve medication access concerns  Interventions: . Comprehensive medication review completed; medication list updated in  electronic medical record.  Bertram Savin care team collaboration (see longitudinal plan of care) . [name] XR by [employer]: Patient meets income criteria for this medication's patient assistance program. Reviewed application process. Completed application has been submitted to [employer] patient assistance program. . [employer] has mailed patient an attestation form to complete and sign along with original application which was missing PCP state license number . Will add state license number and mail application along with completed attestation form once patient returns it to the office . Obtained a free voucher from [employer]. Called and provided voucher information to [place] and sent in prescription for [name] XR  Patient Self Care Activities:  . Patient will complete and sign attestation form and return to the office along with original application . Pick up one month supply of [name] XR for free at [facility]  . Obtain samples from the office if needed  Initial goal documentation        Diabetes   Recent Relevant Labs: Lab Results  Component Value Date/Time   HGBA1C 6.6 (H) 11/07/2019 12:03 PM   HGBA1C 6.7 (H) 07/01/2019 11:51 AM   HGBA1C 7.7 02/13/2018 12:00 AM   MICROALBUR 30 11/07/2019 11:30 AM   MICROALBUR 10 10/30/2018 10:29 AM    Kidney Function Lab Results  Component Value Date/Time   CREATININE 1.30 (H) 11/07/2019 12:03 PM   CREATININE 1.23 07/01/2019 11:51 AM   GFRNONAA 57 (L) 11/07/2019 12:03 PM   GFRAA 66 11/07/2019 12:03 PM   Checking BG: Daily in the evening  Recent HS BG readings: 161,096,045 Patient has failed these meds in past: [place] Patient is currently controlled on the following medications:   [name] XR 50-1060m twice daily  Last diabetic Foot exam: 11/07/19 Last diabetic Eye exam: 02/19/19 Lab Results  Component Value Date/Time   HMDIABEYEEXA No Retinopathy 02/19/2019 12:00 AM    We discussed:  Pt called and stated that he  received [name] IR in the mail from [place]RECT[place]patient assistance instead of [place] determined that they processed a previous application submitted several months ago for JAmerican Electric PowerIR, instead of the new application that was sent in last month for [name] XR. Advised representative that pt is supposed to receive [name] XR not IR. Representative was able to locate the newer application and stated he would process it and send out a new attestation letter to the patient to sign and return. PCP state license number also needs to be added to the application and returned (also mailed to the patient).   [employer] was able to provide a free one month voucher for [name] XR so that patient can obtain it for free at a retail pharmacy while waiting for new application to be processed.  Collaborated with PCP staff to send in prescription for [name] XR 50/1056mto [place]antCalled CaAssurantnd provided free voucher information for billing.  Notified patient that he would be receiving attestation letter and application in the mail and he needs to return both to the office after signing the attestation form. I will mail completed application to [employer] with attestation form.   Advised pt that we  could also provide him samples at the office if needed  Plan Continue current medications  Mail in patient assistance application and attestation form once completed and returned by patient   Follow up: 1 month phone visit  Jannette Fogo, PharmD Clinical Pharmacist [facility] [phone]

## 2020-02-07 DIAGNOSIS — N5201 Erectile dysfunction due to arterial insufficiency: Secondary | ICD-10-CM | POA: Diagnosis not present

## 2020-02-07 DIAGNOSIS — N401 Enlarged prostate with lower urinary tract symptoms: Secondary | ICD-10-CM | POA: Diagnosis not present

## 2020-02-07 DIAGNOSIS — R3912 Poor urinary stream: Secondary | ICD-10-CM | POA: Diagnosis not present

## 2020-02-10 ENCOUNTER — Other Ambulatory Visit: Payer: Self-pay

## 2020-02-10 ENCOUNTER — Telehealth: Payer: Medicare HMO

## 2020-02-10 ENCOUNTER — Ambulatory Visit (INDEPENDENT_AMBULATORY_CARE_PROVIDER_SITE_OTHER): Payer: Medicare HMO

## 2020-02-10 DIAGNOSIS — I129 Hypertensive chronic kidney disease with stage 1 through stage 4 chronic kidney disease, or unspecified chronic kidney disease: Secondary | ICD-10-CM

## 2020-02-10 DIAGNOSIS — E1122 Type 2 diabetes mellitus with diabetic chronic kidney disease: Secondary | ICD-10-CM

## 2020-02-10 DIAGNOSIS — E7800 Pure hypercholesterolemia, unspecified: Secondary | ICD-10-CM

## 2020-02-11 NOTE — Chronic Care Management (AMB) (Signed)
Chronic Care Management   Follow Up Note   02/10/2020 Name: Andres [name] MRN: [id number] DOB: 13-Jan-1951  Referred by: Glendale Chard, MD Reason for referral : Chronic Care Management (FU RN CM Call )   Andres [name] is a 69 y.o. year old male who is a primary care patient of Glendale Chard, MD. The CCM team was consulted for assistance with chronic disease management and care coordination needs.    Review of patient status, including review of consultants reports, relevant laboratory and other test results, and collaboration with appropriate care team members and the patient's provider was performed as part of comprehensive patient evaluation and provision of chronic care management services.    SDOH (Social Determinants of Health) assessments performed: Yes - no acute challenges noted See Care Plan activities for detailed interventions related to [place])  Placed outbound call to patient for a CCM RN CM care plan follow up.      Outpatient Encounter Medications as of 02/10/2020  Medication Sig Note  . Accu-Chek FastClix Lancets MISC One tab po qd dx; e11.65   . ACCU-CHEK GUIDE test strip Use to check BS qd dx: E11.65   . Arginine 500 MG CAPS Take by mouth. Daily   . aspirin 81 MG tablet Take 81 mg by mouth daily.   . cholecalciferol (VITAMIN D) 1000 UNITS tablet Take 1,000 Units by mouth daily.   . Cyanocobalamin (VITAMIN B 12 PO) Take 1 capsule by mouth. 205mg daily   . fluticasone (CUTIVATE) 0.05 % cream Apply topically 2 (two) times daily. 11/25/2019: As needed for rash  . levocetirizine (XYZAL) 5 MG tablet Take 1 tablet (5 mg total) by mouth every evening.   . Multiple Vitamin (MULTIVITAMIN) tablet Take 1 tablet by mouth daily.   .Marland Kitchenolmesartan-hydrochlorothiazide (BENICAR HCT) 20-12.5 MG tablet TAKE 1 TABLET BY MOUTH ONCE DAILY.   . rosuvastatin (CRESTOR) 10 MG tablet TAKE (1) TABLET BY MOUTH DAILY. 11/25/2019: 6 days per week  . SitaGLIPtin-MetFORMIN HCl ([name] XR) 50-1000 MG  TB24 Take 1 tablet by mouth 2 (two) times daily.   . tadalafil (CIALIS) 5 MG tablet TAKE 1 TABLET BY MOUTH ONCE A DAY.    No facility-administered encounter medications on file as of 02/10/2020.     Objective:  Lab Results  Component Value Date   HGBA1C 6.6 (H) 11/07/2019   HGBA1C 6.7 (H) 07/01/2019   HGBA1C 6.4 (H) 03/05/2019   Lab Results  Component Value Date   MICROALBUR 30 11/07/2019   LDLCALC 32 11/07/2019   CREATININE 1.30 (H) 11/07/2019   BP Readings from Last 3 Encounters:  11/07/19 118/82  11/07/19 118/82  07/01/19 110/82    Goals Addressed      Patient Stated   .  "I am going to start walking again" (pt-stated)        Current Barriers:  .Marland KitchenKnowledge Deficits related to diabetes disease management and Self health management  . Chronic Disease Management support and education needs related to DMII, CKD stage 3, Pure Hypercholesterolemia, Hypertensive Nephropathy  Nurse Case Manager Clinical Goal(s):  .Marland KitchenOver the next 30 days, patient will verbalize basic understanding of diabetes disease process and self health management plan as evidenced by patient will verbalize increased knowledge and understanding of meal planning using the plate method and portion control  Goal Met . Over the next 30 days, patient will have a routine exercise regimen in place to include daily walking for 10 min or more per day. Goal Met .  04/18/19 New Over the next 30 days patient will verbalize better understanding about how walking and or exercising routinely can help lower his A1C and improve his overall stamina Goal Met  . 02/10/20 New Over the next 90 days, patient will work with the CCM team and PCP for disease education and support to improve and maintain Self Health management of DM  CCM RN CM Interventions: 02/10/20 completed call with patient  . Evaluation of current treatment plan related to diabetes and patient's adherence to plan as established by provider . Provided education to  patient re: current A1C of 6.6 % obtained on 11/07/19; reviewed target A1C < 5.6% to fall within non-diabetic range; reiterated how implementing daily exercise can help lower blood sugars; discussed ADA recommendations to exercise 150 minutes per week; confirmed patient is walking daily  . Determined patient is engaged with embedded Dola Factor RPH . Determined patient is receiving medication assistance for Janument XR by [employer], reports adherence . Provided patient with printed educational materials related to [employer] with Diabetes; Preventing Complications from Diabetes; Diabetes Care Schedule . Advised patient, providing education and rationale, to check cbg daily before meals and record, calling the CCM team and or PCP office for findings outside established parameters; FBS 80-130; <180 after meals  . Discussed plans with patient for ongoing care management follow up and provided patient with direct contact information for care management team  Patient Self Care Activities:  . Self administers medications as prescribed . Attends all scheduled provider appointments . Calls pharmacy for medication refills . Performs ADL's independently . Performs IADL's independently . Calls provider office for new concerns or questions  Please see past updates related to this goal by clicking on the "Past Updates" button in the selected goal      .  COMPLETED: I would like to apply for financial assistance for my medications (pt-stated)        Current Barriers:  . Non Adherence to prescribed medication regimen and Financial Barriers  Pharmacist Clinical Goal(s):  [name] Over the next 45 days, patient will work with CCM PharmD to address needs related to applying for financial assistance for [name]  Interventions: . Comprehensive medication review performed. . Advised patient to continue taking all medications as prescribed. Reviewed medication purpose.  Patient denies adverse events.     . Patient states he cannot afford his [name] ($45 copay/month, however patient is now in coverage gap and copay is $110).  He is agreeable to participate in patient assistance program through [employer]. Will prepare documents and leave at front PCP office for patient signature.  We will submit this for the year 2021 . [facility] CPhT, Etter Sjogren following.   . Will continue to provide samples as able and apply for assistance Kandis Fantasia) on Dec 15th, 2020 for the next calendar year 2021.  Patient verbalizes understanding . [employer] 6789 application for [employer] again on 08/07/19 o 09/10/19: [facility] mailed letter of attestation to patient this week.  Instructed patient to bring to me and I will file.  Will provided samples at that time. . Will continue to follow   Patient Self Care Activities:  . Self administers medications as prescribed . Attends all scheduled provider appointments . Calls pharmacy for medication refills . Attends church or other social activities . Performs ADL's independently . Performs IADL's independently . Calls provider office for new concerns or questions  Please see past updates related to this goal by clicking on the "Past Updates" button  in the selected goal      .  COMPLETED: I would like to optimize my medication management of my chronic conditions (pt-stated)        Current Barriers:  [name] Knowledge Deficits related to disease state managment and Non Adherence to prescribed medication regimen  Pharmacist Clinical Goal(s):  [name] Over the next 90 days, patient will work with PharmD to address needs related to optimized  medication management of chronic conditions  Goal updated on 06/12/2019  Interventions: . Comprehensive medication review performed.  Reviewed medication fill history via insurance claims data confirming patient appears compliant with having his medications filled on time as prescribed by provider. . Reviewed & discussed the following diabetes-related  information with patient: o Continue checking blood sugars as directed o Follow ADA recommended "diabetes-friendly" diet  (reviewed healthy snack/food options) o Encouraged patient to exercise as able and as recommended by PCP. o Confirmed current DM regimen: [name] 50-1055m XR -->1 tablet by mouth twice daily (samples provided) - Consider switching SGLT2-/metformin combo--pt will consider - 10/02/19-->Difficulty obtaining janumet via merck and better outcomes w/ SGLT2s . Reminded patient to mail back letter of attestation with entire application. . Samples left up front for patient o Patient uses Accu Check Guide glucometer.  He checks his blood sugar each morning o Reviewed ALL medication purposes/side effects-->patient denies adverse events, denies hypoglycemia, reports FBG 90s-110s.  Encouraged patient to continue taking all medications as prescribed. o Most recent A1c is 6.7% on 07/02/19 (was 6.5%).  Most recent Scr 1.23 o Antihyperlipidemia regimen: rosuvastatin 152mdaily o Antihypertensive regimen: olmesartan/HCTZ 2064m2.5mg daily  Patient Self Care Activities:  . Self administers medications as prescribed . Attends all scheduled provider appointments . Calls pharmacy for medication refills . Performs ADL's independently . Performs IADL's independently  Please see past updates related to this goal by clicking on the "Past Updates" button in the selected goal        Plan:   Telephone follow up appointment with care management team member scheduled for: 04/06/20  AngBarb MerinoN, BSN, CCM Care Management Coordinator [facility]  Direct Phone: [phone]

## 2020-02-11 NOTE — Patient Instructions (Signed)
Visit Information  Goals Addressed      Patient Stated   .  "I am going to start walking again" (pt-stated)        Current Barriers:  [name] Knowledge Deficits related to diabetes disease management and Self health management  . Chronic Disease Management support and education needs related to DMII, CKD stage 3, Pure Hypercholesterolemia, Hypertensive Nephropathy  Nurse Case Manager Clinical Goal(s):  [name] Over the next 30 days, patient will verbalize basic understanding of diabetes disease process and self health management plan as evidenced by patient will verbalize increased knowledge and understanding of meal planning using the plate method and portion control  Goal Met . Over the next 30 days, patient will have a routine exercise regimen in place to include daily walking for 10 min or more per day. Goal Met . 04/18/19 New Over the next 30 days patient will verbalize better understanding about how walking and or exercising routinely can help lower his A1C and improve his overall stamina Goal Met  . 02/10/20 New Over the next 90 days, patient will work with the CCM team and PCP for disease education and support to improve and maintain Self Health management of DM  CCM RN CM Interventions: 02/10/20 completed call with patient  . Evaluation of current treatment plan related to diabetes and patient's adherence to plan as established by provider . Provided education to patient re: current A1C of 6.6 % obtained on 11/07/19; reviewed target A1C < 5.6% to fall within non-diabetic range; reiterated how implementing daily exercise can help lower blood sugars; discussed ADA recommendations to exercise 150 minutes per week; confirmed patient is walking daily  . Determined patient is engaged with embedded Dola Factor RPH . Determined patient is receiving medication assistance for Janument XR by [employer], reports adherence . Provided patient with printed educational materials related to [employer] with Diabetes; Preventing Complications from Diabetes; Diabetes Care Schedule . Advised patient, providing education and rationale, to check cbg daily before meals and record, calling the CCM team and or PCP office for findings outside established parameters; FBS 80-130; <180 after meals  . Discussed plans with patient for ongoing care management follow up and provided patient with direct contact information for care management team  Patient Self Care Activities:  . Self administers medications as prescribed . Attends all scheduled provider appointments . Calls pharmacy for medication refills . Performs ADL's independently . Performs IADL's independently . Calls provider office for new concerns or questions  Please see past updates related to this goal by clicking on the "Past Updates" button in the selected goal      .  COMPLETED: I would like to apply for financial assistance for my medications (pt-stated)        Current Barriers:  . Non Adherence to prescribed medication regimen and Financial Barriers  Pharmacist Clinical Goal(s):  [name] Over the next 45 days, patient will work with CCM PharmD to address needs related to applying for financial assistance for [name]  Interventions: . Comprehensive medication review performed. . Advised patient to continue taking all medications as prescribed. Reviewed medication purpose.  Patient denies adverse events.   . Patient states he cannot afford his [name] ($45 copay/month, however patient is now in coverage gap and copay is $110).  He is agreeable to participate in patient assistance program through [employer]. Will prepare documents and leave at front PCP office for patient signature.  We will submit this for the year 2021 . [facility]  CPhT, Etter Sjogren following.   . Will continue to provide samples as able and apply for assistance Kandis Fantasia) on Dec 15th, 2020 for the next calendar year 2021.  Patient verbalizes understanding . [employer] 2549  application for [employer] again on 08/07/19 o 09/10/19: [facility] mailed letter of attestation to patient this week.  Instructed patient to bring to me and I will file.  Will provided samples at that time. . Will continue to follow   Patient Self Care Activities:  . Self administers medications as prescribed . Attends all scheduled provider appointments . Calls pharmacy for medication refills . Attends church or other social activities . Performs ADL's independently . Performs IADL's independently . Calls provider office for new concerns or questions  Please see past updates related to this goal by clicking on the "Past Updates" button in the selected goal      .  COMPLETED: I would like to optimize my medication management of my chronic conditions (pt-stated)        Current Barriers:  [name] Knowledge Deficits related to disease state managment and Non Adherence to prescribed medication regimen  Pharmacist Clinical Goal(s):  [name] Over the next 90 days, patient will work with PharmD to address needs related to optimized  medication management of chronic conditions  Goal updated on 06/12/2019  Interventions: . Comprehensive medication review performed.  Reviewed medication fill history via insurance claims data confirming patient appears compliant with having his medications filled on time as prescribed by provider. . Reviewed & discussed the following diabetes-related information with patient: o Continue checking blood sugars as directed o Follow ADA recommended "diabetes-friendly" diet  (reviewed healthy snack/food options) o Encouraged patient to exercise as able and as recommended by PCP. o Confirmed current DM regimen: [name] 50-103m XR -->1 tablet by mouth twice daily (samples provided) - Consider switching SGLT2-/metformin combo--pt will consider - 10/02/19-->Difficulty obtaining janumet via [employer]ck and better outcomes w/ SGLT2s . Reminded patient to mail back letter of attestation with  entire application. . Samples left up front for patient o Patient uses Accu Check Guide glucometer.  He checks his blood sugar each morning o Reviewed ALL medication purposes/side effects-->patient denies adverse events, denies hypoglycemia, reports FBG 90s-110s.  Encouraged patient to continue taking all medications as prescribed. o Most recent A1c is 6.7% on 07/02/19 (was 6.5%).  Most recent Scr 1.23 o Antihyperlipidemia regimen: rosuvastatin 116mdaily o Antihypertensive regimen: olmesartan/HCTZ 2023m2.5mg daily  Patient Self Care Activities:  . Self administers medications as prescribed . Attends all scheduled provider appointments . Calls pharmacy for medication refills . Performs ADL's independently . Performs IADL's independently  Please see past updates related to this goal by clicking on the "Past Updates" button in the selected goal       Patient verbalizes understanding of instructions provided today.   Telephone follow up appointment with care management team member scheduled for:  04/06/20  AngBarb MerinoN, BSN, CCM Care Management Coordinator [facility]aton[facility]  Direct Phone: [phone]

## 2020-02-14 ENCOUNTER — Telehealth: Payer: Self-pay | Admitting: *Deleted

## 2020-02-14 NOTE — Chronic Care Management (AMB) (Signed)
  Chronic Care Management   Note  02/14/2020 Name: Andres [name] MRN: [id number] DOB: [birth year]  [name] is a 69 y.o. year old male who is a primary care patient of Glendale Chard, MD and is actively engaged with the care management team. I reached out to [place] by phone today to assist with re-scheduling a follow up visit with the Pharmacist.  Follow up plan: Telephone appointment with care management team member scheduled for: 02/28/2020.  [place], [place] [id number] Direct Dial: [phone] [email or web address]: [email or web address]

## 2020-02-25 ENCOUNTER — Telehealth: Payer: Self-pay

## 2020-02-27 ENCOUNTER — Telehealth: Payer: Self-pay

## 2020-02-27 NOTE — Chronic Care Management (AMB) (Signed)
    Chronic Care Management Pharmacy Assistant   Name: Andres [name]  MRN: [id number] DOB: 07-15-1950  Reason for Encounter: Medication Review/CCM Reminder Call  PCP : Glendale Chard, MD  Allergies:  No Known Allergies  Medications: Outpatient Encounter Medications as of 02/27/2020  Medication Sig Note  . Accu-Chek FastClix Lancets MISC One tab po qd dx; e11.65   . ACCU-CHEK GUIDE test strip Use to check BS qd dx: E11.65   . Arginine 500 MG CAPS Take by mouth. Daily   . aspirin 81 MG tablet Take 81 mg by mouth daily.   . cholecalciferol (VITAMIN D) 1000 UNITS tablet Take 1,000 Units by mouth daily.   . Cyanocobalamin (VITAMIN B 12 PO) Take 1 capsule by mouth. 2031mcg daily   . fluticasone (CUTIVATE) 0.05 % cream Apply topically 2 (two) times daily. 11/25/2019: As needed for rash  . levocetirizine (XYZAL) 5 MG tablet Take 1 tablet (5 mg total) by mouth every evening.   . Multiple Vitamin (MULTIVITAMIN) tablet Take 1 tablet by mouth daily.   [name] olmesartan-hydrochlorothiazide (BENICAR HCT) 20-12.5 MG tablet TAKE 1 TABLET BY MOUTH ONCE DAILY.   . rosuvastatin (CRESTOR) 10 MG tablet TAKE (1) TABLET BY MOUTH DAILY. 11/25/2019: 6 days per week  . SitaGLIPtin-MetFORMIN HCl ([name] XR) 50-1000 MG TB24 Take 1 tablet by mouth 2 (two) times daily.   . tadalafil (CIALIS) 5 MG tablet TAKE 1 TABLET BY MOUTH ONCE A DAY.    No facility-administered encounter medications on file as of 02/27/2020.    Current Diagnosis: Patient Active Problem List   Diagnosis Date Noted  . Type 2 diabetes mellitus with stage 2 chronic kidney disease, with long-term current use of insulin ([place]) 07/23/2018  . Pure hypercholesterolemia 07/23/2018  . HTN (hypertension) 07/23/2018  . Nephropathy associated with another disease 02/13/2018    Follow-Up:  Scheduled Follow-Up With Clinical Pharmacist- Reminder call for scheduled follow up on 02/28/20. Spoke with patient he is aware of telephone appointment with Jannette Fogo, CPP. Patient advised to please have medications, blood pressure logs and blood sugar logs near during call.  [name], [place] Pharmacist Assistant [phone]

## 2020-02-28 ENCOUNTER — Ambulatory Visit: Payer: Self-pay

## 2020-02-28 ENCOUNTER — Other Ambulatory Visit: Payer: Self-pay

## 2020-02-28 DIAGNOSIS — E1122 Type 2 diabetes mellitus with diabetic chronic kidney disease: Secondary | ICD-10-CM | POA: Diagnosis not present

## 2020-02-28 DIAGNOSIS — Z794 Long term (current) use of insulin: Secondary | ICD-10-CM

## 2020-02-28 DIAGNOSIS — E7800 Pure hypercholesterolemia, unspecified: Secondary | ICD-10-CM

## 2020-02-28 DIAGNOSIS — I129 Hypertensive chronic kidney disease with stage 1 through stage 4 chronic kidney disease, or unspecified chronic kidney disease: Secondary | ICD-10-CM

## 2020-02-28 DIAGNOSIS — N182 Chronic kidney disease, stage 2 (mild): Secondary | ICD-10-CM

## 2020-02-28 MED ORDER — LEVOCETIRIZINE DIHYDROCHLORIDE 5 MG PO TABS: 5.0000 mg | ORAL_TABLET | Freq: Every evening | ORAL | 2 refills | Status: DC

## 2020-02-28 MED ORDER — FLUTICASONE PROPIONATE 0.05 % EX CREA: TOPICAL_CREAM | Freq: Two times a day (BID) | CUTANEOUS | 2 refills | Status: DC

## 2020-02-28 NOTE — Patient Instructions (Addendum)
Visit Information  Goals Addressed            This Visit's Progress   . Pharmacy Care Plan       CARE PLAN ENTRY  Current Barriers:  . Chronic Disease Management support, education, and care coordination needs related to Hypertension, Hyperlipidemia, Diabetes, and Chronic Kidney Disease   Hypertension . Pharmacist Clinical Goal(s): o Over the next 180 days, patient will work with PharmD and providers to maintain BP goal <130/80 . Current regimen:  o Olmesartan/HCTZ 20/12.5mg  daily . Interventions: o Recommend patient check blood pressure periodically and if symptomatic and record for provider o Provided dietary and exercise recommendations . Patient self care activities - Over the next 90 days, patient will: o Check BP periodically and if symptomatic, document, and provide at future appointments o Ensure daily salt intake < 2300 mg/day o Try to exercise for 30 minutes daily 5 times per week  Hyperlipidemia . Pharmacist Clinical Goal(s): o Over the next 180 days, patient will work with PharmD and providers to maintain LDL goal < 70 . Current regimen:  o Rosuvastatin 10mg  daily, 6 days per week . Interventions: o Discussed increase in healthy fats in diet and increase in exercise to help improve HDL (good cholesterol) o Provided dietary and exercise recommendations . Patient self care activities - Over the next 90 days, patient will: o Take medications daily as directed o Increase dietary intake of healthy fats (avocados, nuts, flaxseed, etc) o Try to exercise for 30 minutes daily 5 times per week  Diabetes . Pharmacist Clinical Goal(s): o Over the next 90 days, patient will work with PharmD and providers to maintain A1c goal <7% . Current regimen:  o [name] XR 50-1000mg  twice daily . Interventions: o Patient has received [name] XR through the mail from [employer] patient assistance program. Continue to assist as needed o Provided dietary and exercise  recommendations Patient self care activities - Over the next 90 days, patient will: o Check blood sugar at bedtime, document, and provide at future appointments o Contact provider with any episodes of hypoglycemia o Focus on eating well-balanced meals  Medication management . Pharmacist Clinical Goal(s): o Over the next 90 days, patient will work with PharmD and providers to achieve optimal medication adherence . Current pharmacy: [facility] . Interventions o Comprehensive medication review performed. o Continue current medication management strategy o Provided information regarding medication cost and mail order option o Coordinate refills for fluticasone cream and levocetirizine with PCP staff . Patient self care activities - Over the next 90 days, patient will: o Take medications as prescribed o Report any questions or concerns to PharmD and/or provider(s)  Please see past updates related to this goal by clicking on the "Past Updates" button in the selected goal         The patient verbalized understanding of instructions provided today and agreed to receive a mailed copy of patient instruction and/or educational materials.  Telephone follow up appointment with pharmacy team member scheduled for: 06/02/20 @ 3:30 PM  Jannette Fogo, PharmD Clinical Pharmacist [facility] [phone]   Diabetes Mellitus and Nutrition, Adult When you have diabetes (diabetes mellitus), it is very important to have healthy eating habits because your blood sugar (glucose) levels are greatly affected by what you eat and drink. Eating healthy foods in the appropriate amounts, at about the same times every day, can help you:  Control your blood glucose.  Lower your risk of heart disease.  Improve your blood  pressure.  Reach or maintain a healthy weight. Every person with diabetes is different, and each person has different needs for a meal plan. Your health  care provider may recommend that you work with a diet and nutrition specialist (dietitian) to make a meal plan that is best for you. Your meal plan may vary depending on factors such as:  The calories you need.  The medicines you take.  Your weight.  Your blood glucose, blood pressure, and cholesterol levels.  Your activity level.  Other health conditions you have, such as heart or kidney disease. How do carbohydrates affect me? Carbohydrates, also called carbs, affect your blood glucose level more than any other type of food. Eating carbs naturally raises the amount of glucose in your blood. Carb counting is a method for keeping track of how many carbs you eat. Counting carbs is important to keep your blood glucose at a healthy level, especially if you use insulin or take certain oral diabetes medicines. It is important to know how many carbs you can safely have in each meal. This is different for every person. Your dietitian can help you calculate how many carbs you should have at each meal and for each snack. Foods that contain carbs include:  Bread, cereal, rice, pasta, and crackers.  Potatoes and corn.  Peas, beans, and lentils.  Milk and yogurt.  Fruit and juice.  Desserts, such as cakes, cookies, ice cream, and candy. How does alcohol affect me? Alcohol can cause a sudden decrease in blood glucose (hypoglycemia), especially if you use insulin or take certain oral diabetes medicines. Hypoglycemia can be a life-threatening condition. Symptoms of hypoglycemia (sleepiness, dizziness, and confusion) are similar to symptoms of having too much alcohol. If your health care provider says that alcohol is safe for you, follow these guidelines:  Limit alcohol intake to no more than 1 drink per day for nonpregnant women and 2 drinks per day for men. One drink equals 12 oz of beer, 5 oz of wine, or 1 oz of hard liquor.  Do not drink on an empty stomach.  Keep yourself hydrated with  water, diet soda, or unsweetened iced tea.  Keep in mind that regular soda, juice, and other mixers may contain a lot of sugar and must be counted as carbs. What are tips for following this plan?  Reading food labels  Start by checking the serving size on the "Nutrition Facts" label of packaged foods and drinks. The amount of calories, carbs, fats, and other nutrients listed on the label is based on one serving of the item. Many items contain more than one serving per package.  Check the total grams (g) of carbs in one serving. You can calculate the number of servings of carbs in one serving by dividing the total carbs by 15. For example, if a food has 30 g of total carbs, it would be equal to 2 servings of carbs.  Check the number of grams (g) of saturated and trans fats in one serving. Choose foods that have low or no amount of these fats.  Check the number of milligrams (mg) of salt (sodium) in one serving. Most people should limit total sodium intake to less than 2,300 mg per day.  Always check the nutrition information of foods labeled as "low-fat" or "nonfat". These foods may be higher in added sugar or refined carbs and should be avoided.  Talk to your dietitian to identify your daily goals for nutrients listed on the label. Shopping  Avoid buying canned, premade, or processed foods. These foods tend to be high in fat, sodium, and added sugar.  Shop around the outside edge of the grocery store. This includes fresh fruits and vegetables, bulk grains, fresh meats, and fresh dairy. Cooking  Use low-heat cooking methods, such as baking, instead of high-heat cooking methods like deep frying.  Cook using healthy oils, such as olive, canola, or sunflower oil.  Avoid cooking with butter, cream, or high-fat meats. Meal planning  Eat meals and snacks regularly, preferably at the same times every day. Avoid going long periods of time without eating.  Eat foods high in fiber, such as  fresh fruits, vegetables, beans, and whole grains. Talk to your dietitian about how many servings of carbs you can eat at each meal.  Eat 4-6 ounces (oz) of lean protein each day, such as lean meat, chicken, fish, eggs, or tofu. One oz of lean protein is equal to: ? 1 oz of meat, chicken, or fish. ? 1 egg. ?  cup of tofu.  Eat some foods each day that contain healthy fats, such as avocado, nuts, seeds, and fish. Lifestyle  Check your blood glucose regularly.  Exercise regularly as told by your health care provider. This may include: ? 150 minutes of moderate-intensity or vigorous-intensity exercise each week. This could be brisk walking, biking, or water aerobics. ? Stretching and doing strength exercises, such as yoga or weightlifting, at least 2 times a week.  Take medicines as told by your health care provider.  Do not use any products that contain nicotine or tobacco, such as cigarettes and e-cigarettes. If you need help quitting, ask your health care provider.  Work with a Social worker or diabetes educator to identify strategies to manage stress and any emotional and social challenges. Questions to ask a health care provider  Do I need to meet with a diabetes educator?  Do I need to meet with a dietitian?  What number can I call if I have questions?  When are the best times to check my blood glucose? Where to find more information:  American Diabetes Association: diabetes.org  Academy of Nutrition and Dietetics: www.eatright.[employer] of Diabetes and Digestive and Kidney Diseases (NIH): [employer] Summary  A healthy meal plan will help you control your blood glucose and maintain a healthy lifestyle.  Working with a diet and nutrition specialist (dietitian) can help you make a meal plan that is best for you.  Keep in mind that carbohydrates (carbs) and alcohol have immediate effects on your blood glucose levels. It is important to count carbs and to  use alcohol carefully. This information is not intended to replace advice given to you by your health care provider. Make sure you discuss any questions you have with your health care provider. Document Revised: 06/09/2017 Document Reviewed: 08/01/2016 Elsevier Patient Education  2020 [employer].

## 2020-02-28 NOTE — Chronic Care Management (AMB) (Signed)
Chronic Care Management Pharmacy  Name: Andres [name]  MRN: [id number] DOB: 28-May-1951  Chief Complaint/ HPI  Andres [name],  69 y.o. , male presents for their Follow-Up CCM visit with the clinical pharmacist via telephone due to COVID-19 Pandemic.  PCP : Andres Chard, MD  Their chronic conditions include: Hypertension, Diabetes with stage 2 CKD, Pure hypercholesterolemia  Office Visits: 11/07/19 AWV and OV: Annual exam. Diabetic foot exam performed. Labs ordered (CMP14+EGFR, CBC, HgbA1c, Lipid panel). Referred to CCM. EKG showed no acute changes. Bilateral impacted cerumen flushed by irrigation. Pt instructed to decrease rosuvastatin 16m to 6 days per week (skip Sundays) due to cholesterol being on the low end.   06/12/19 OV: Diabetic follow up visit. Labs ordered (BMP8+EGFR, HgbA1c). Importance of medication compliance discussed. Healthy diet and exercise encouraged. Will switch to [name] XR [phone] once daily once patient runs out of current supply of [name] XR 50-10075m   Consult Visits: N/A  CCM Encounters: 10/02/19 PharmD: Difficulty obtaining [name] XR from [facility]ed that pt needs to mail back attestation form. Samples of [name] XR given.   09/11/19 PharmD: Comprehensive medication review performed. Instructed pt to bring attestation form to the office so that PharmD could mail it.   09/04/19 PharmD: Medication fill history reviewed. Samples of [name] XR provided. Dietary recommendations given.   08/07/19 PharmD: [name] assistance mailed to [employer] again.   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   Medications: Outpatient Encounter Medications as of 02/28/2020  Medication Sig Note  . Accu-Chek FastClix Lancets MISC One tab po qd dx; e11.65   . ACCU-CHEK GUIDE test strip Use to check BS qd dx: E11.65   . Arginine 500 MG CAPS Take by mouth. Daily   . aspirin 81 MG tablet Take 81 mg by mouth daily.    . cholecalciferol (VITAMIN D) 1000 UNITS tablet Take 1,000 Units by mouth daily.   . Cyanocobalamin (VITAMIN B 12 PO) Take 1 capsule by mouth. 200021mdaily   . fluticasone (CUTIVATE) 0.05 % cream Apply topically 2 (two) times daily. 11/25/2019: As needed for rash  . levocetirizine (XYZAL) 5 MG tablet Take 1 tablet (5 mg total) by mouth every evening.   . Multiple Vitamin (MULTIVITAMIN) tablet Take 1 tablet by mouth daily.   . oMarland Kitchenmesartan-hydrochlorothiazide (BENICAR HCT) 20-12.5 MG tablet TAKE 1 TABLET BY MOUTH ONCE DAILY.   . rosuvastatin (CRESTOR) 10 MG tablet TAKE (1) TABLET BY MOUTH DAILY. 11/25/2019: 6 days per week  . SitaGLIPtin-MetFORMIN HCl ([name] XR) 50-1000 MG TB24 Take 1 tablet by mouth 2 (two) times daily.   . tadalafil (CIALIS) 5 MG tablet TAKE 1 TABLET BY MOUTH ONCE A DAY.    No facility-administered encounter medications on file as of 02/28/2020.   Current Diagnosis/Assessment:  SDOH Interventions     Most Recent Value  SDOH Interventions  Financial Strain Interventions Other (Comment)  [Continue to assist with [name] XR patient assistance as needed]      Goals Addressed            This Visit's Progress   . Pharmacy Care Plan       CARE PLAN ENTRY  Current Barriers:  . Chronic Disease Management support, education, and care coordination needs related to Hypertension, Hyperlipidemia, Diabetes, and Chronic Kidney Disease   Hypertension . Pharmacist Clinical Goal(s): o Over the next 180 days, patient will work with PharmD and providers to maintain BP goal <130/80 . Current regimen:  o Olmesartan/HCTZ 20/12.24m daily . Interventions: o Recommend patient check blood pressure periodically and if symptomatic and record for provider o Provided dietary and exercise recommendations . Patient self care activities - Over the next 90 days, patient will: o Check BP periodically and if symptomatic, document, and provide at future appointments o Ensure daily salt intake <  2300 mg/day o Try to exercise for 30 minutes daily 5 times per week  Hyperlipidemia . Pharmacist Clinical Goal(s): o Over the next 180 days, patient will work with PharmD and providers to maintain LDL goal < 70 . Current regimen:  o Rosuvastatin 123mdaily, 6 days per week . Interventions: o Discussed increase in healthy fats in diet and increase in exercise to help improve HDL (good cholesterol) o Provided dietary and exercise recommendations . Patient self care activities - Over the next 90 days, patient will: o Take medications daily as directed o Increase dietary intake of healthy fats (avocados, nuts, flaxseed, etc) o Try to exercise for 30 minutes daily 5 times per week  Diabetes . Pharmacist Clinical Goal(s): o Over the next 90 days, patient will work with PharmD and providers to maintain A1c goal <7% . Current regimen:  o [name] XR 50-100055mwice daily . Interventions: o Patient has received [name] XR through the mail from [employer] assistance program. Continue to assist as needed o Provided dietary and exercise recommendations Patient self care activities - Over the next 90 days, patient will: o Check blood sugar at bedtime, document, and provide at future appointments o Contact provider with any episodes of hypoglycemia o Focus on eating well-balanced meals  Medication management . Pharmacist Clinical Goal(s): o Over the next 90 days, patient will work with PharmD and providers to achieve optimal medication adherence . [facility] o Comprehensive medication review performed. o Continue current medication management strategy o Provided information regarding medication cost and mail order option o Coordinate refills for fluticasone cream and levocetirizine with PCP staff . Patient self care activities - Over the next 90 days, patient will: o Take medications as prescribed o Report any questions or concerns to PharmD  and/or provider(s)  Please see past updates related to this goal by clicking on the "Past Updates" button in the selected goal         Diabetes   Recent Relevant Labs: Lab Results  Component Value Date/Time   HGBA1C 6.6 (H) 11/07/2019 12:03 PM   HGBA1C 6.7 (H) 07/01/2019 11:51 AM   HGBA1C 7.7 02/13/2018 12:00 AM   MICROALBUR 30 11/07/2019 11:30 AM   MICROALBUR 10 10/30/2018 10:29 AM    Kidney Function Lab Results  Component Value Date/Time   CREATININE 1.30 (H) 11/07/2019 12:03 PM   CREATININE 1.23 07/01/2019 11:51 AM   GFRNONAA 57 (L) 11/07/2019 12:03 PM   GFRAA 66 11/07/2019 12:03 PM   Checking BG: Daily in the evening, before bedtime  Recent HS BG readings: 110-125 Patient has failed these meds in past: [place] Patient is currently controlled on the following medications:   [name] XR 50-1000m60mice daily  Last diabetic Foot exam: 11/07/19 Last diabetic Eye exam: 02/19/19 Lab Results  Component Value Date/Time   HMDIABEYEEXA No Retinopathy 02/19/2019 12:00 AM    We discussed: . Diet extensively o Pt states that diet varies o Not eating much red met, mostly chicken or fish  o Encouraged well-balanced diet (lean protein, non starchy vegetables, and whole grains) . 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 Pt has received [name] XR in the mail from [employer] patient assistance program  Plan Continue current medications   Hypertension   Office blood pressures are  BP Readings from Last 3 Encounters:  11/07/19 118/82  11/07/19 118/82  07/01/19 110/82   Patient has failed these meds in the past: Lisinopril, Lisinopril/HCTZ,    Patient is currently controlled on the following medications:   Olmesartan/HCTZ 20/12.9m daily  Patient checks BP at home infrequently  Patient home BP readings are ranging: N/A  We discussed:  Pt says BP is normal when he checks   Denies dizziness  Pt states he has had some mild headaches for the past month or so  Pt checked BP, but it was normal  Pt does not take anything for headache, because the headaches are so mild and go away fairly quickly  Pt states he is staying hydrated, drinks 3-4 bottles of water daily  Recommend pt drink 64 ounces of water daily Diet and exercise extensively  Plan Continue current medications   Hyperlipidemia   Lipid Panel     Component Value Date/Time   CHOL 82 (L) 11/07/2019 1203   TRIG 109 11/07/2019 1203   HDL 30 (L) 11/07/2019 1203   CHOLHDL 2.7 11/07/2019 1203   LDLCALC 32 11/07/2019 1203   LABVLDL 20 11/07/2019 1203     The ASCVD Risk score (Goff DC Jr., et al., 2013) failed to calculate for the following reasons:   The valid total cholesterol range is 130 to 320 mg/dL   Patient has failed these meds in past: N/A Patient is currently controlled on the following medications:   Rosuvastatin 117mdaily  Aspirin 8160maily  We discussed:   Pt not taking rosuvastatin on Sundays as directed by PCP Diet and exercise extensively  Increase intake of healthy fats (avocados, nuts, flaxseed, etc)  Increase exercise as recommended  Plan Continue current medications   Health Maintenance   Patient is currently on the following medications:  . Levocetirizine 5mg67mily . Fluticasone 0.05% cream . Vitamin B12 2000mc51mily . Cholecalciferol 1000 units daily . Arginine 500mg 42my . Multivitamin daily  We discussed:    Pt states he has been experiencing some drainage lately  Encouraged pt to discuss at upcoming PCP visit  Could possibly consider an alternative allergy medication or a nasal spray  Confirmed pt has  appointment with PCP on 03/09/20  Pt mentioned needing refills for levocetirizine and fluticasone cream  Plan Continue current medications Collaborate with PCP staff regarding refills for fluticasone cream and levocetirizine  Medication Management   Pt uses [facility] medications Uses pill box? No - Only uses a pill box when traveling Pt endorses 95% compliance and states that he only forgets occasionally but tries to take the most important medications everyday (BP and diabetes meds)  We discussed:   Importance of taking medications everyday as directed  Discussed cost comparison of medications at retail pharmacies versus mail order  Mail order sent patient a letter about most medications being free if he receives through the mail. Pt is considering  Pt asked about  GoodRx prices (recently used [employer] for Cialis at [facility] and it was cheaper than he had been getting)  Explained that for most medications insurance copay will be cheaper than GoodRx, but Cialis is most likely not covered and therefore was cheaper with the coupon  Pt not currently interested in changing pharmacies  Plan Continue current medication management strategy  Follow up: 3 month phone visit  Jannette Fogo, PharmD Clinical Pharmacist [facility] [phone]

## 2020-03-09 ENCOUNTER — Other Ambulatory Visit: Payer: Self-pay

## 2020-03-09 ENCOUNTER — Encounter: Payer: Self-pay | Admitting: Internal Medicine

## 2020-03-09 ENCOUNTER — Ambulatory Visit (INDEPENDENT_AMBULATORY_CARE_PROVIDER_SITE_OTHER): Payer: Medicare HMO | Admitting: Internal Medicine

## 2020-03-09 VITALS — BP 118/76 | HR 86 | Temp 98.1°F | Ht 71.0 in | Wt 234.0 lb

## 2020-03-09 DIAGNOSIS — R0982 Postnasal drip: Secondary | ICD-10-CM

## 2020-03-09 DIAGNOSIS — N182 Chronic kidney disease, stage 2 (mild): Secondary | ICD-10-CM | POA: Diagnosis not present

## 2020-03-09 DIAGNOSIS — Z6832 Body mass index (BMI) 32.0-32.9, adult: Secondary | ICD-10-CM

## 2020-03-09 DIAGNOSIS — I129 Hypertensive chronic kidney disease with stage 1 through stage 4 chronic kidney disease, or unspecified chronic kidney disease: Secondary | ICD-10-CM

## 2020-03-09 DIAGNOSIS — E1122 Type 2 diabetes mellitus with diabetic chronic kidney disease: Secondary | ICD-10-CM

## 2020-03-09 DIAGNOSIS — Z794 Long term (current) use of insulin: Secondary | ICD-10-CM

## 2020-03-09 DIAGNOSIS — M25641 Stiffness of right hand, not elsewhere classified: Secondary | ICD-10-CM | POA: Diagnosis not present

## 2020-03-09 DIAGNOSIS — E6609 Other obesity due to excess calories: Secondary | ICD-10-CM | POA: Diagnosis not present

## 2020-03-09 NOTE — Progress Notes (Addendum)
This visit occurred during the SARS-CoV-2 public health emergency.  Safety protocols were in place, including screening questions prior to the visit, additional usage of staff PPE, and extensive cleaning of exam room while observing appropriate contact time as indicated for disinfecting solutions.  Subjective:     Patient ID: Andres [name] , male    DOB: 12-24-1950 , 69 y.o.   MRN: [id number]   Chief Complaint  Patient presents with  . Diabetes  . Hypertension    HPI  He presents for DM/HTN check. He reports compliance with meds. He denies headaches, chest pain and shortness of breath.   Diabetes He presents for his follow-up diabetic visit. He has type 2 diabetes mellitus. His disease course has been stable. There are no hypoglycemic associated symptoms. Pertinent negatives for diabetes include no blurred vision and no chest pain. There are no hypoglycemic complications. Diabetic complications include nephropathy. Risk factors for coronary artery disease include diabetes mellitus, dyslipidemia, hypertension and male sex. He is following a diabetic diet. His breakfast blood glucose is taken between 8-9 am. His breakfast blood glucose range is generally 90-110 mg/dl. An ACE inhibitor/angiotensin II receptor blocker is being taken. Eye exam is not current.  Hypertension This is a chronic problem. The current episode started more than 1 year ago. The problem has been gradually improving since onset. The problem is controlled. Pertinent negatives include no blurred vision, chest pain, palpitations or shortness of breath. Past treatments include angiotensin blockers and diuretics. The current treatment provides moderate improvement.     Past Medical History:  Diagnosis Date  . Diabetes ([place])   . High cholesterol   . Hypertension    states under control with med., has been on med. x 5 yr.  . Seasonal allergies   . Umbilical hernia 01/6159     Family History  Problem Relation Age of Onset   . Healthy Mother   . Early death Father      Current Outpatient Medications:  .  Accu-Chek FastClix Lancets MISC, One tab po qd dx; e11.65, Disp: 100 each, Rfl: 11 .  ACCU-CHEK GUIDE test strip, Use to check BS qd dx: E11.65, Disp: 100 each, Rfl: 11 .  Arginine 500 MG CAPS, Take by mouth. Daily, Disp: , Rfl:  .  aspirin 81 MG tablet, Take 81 mg by mouth daily., Disp: , Rfl:  .  cholecalciferol (VITAMIN D) 1000 UNITS tablet, Take 1,000 Units by mouth daily., Disp: , Rfl:  .  Cyanocobalamin (VITAMIN B 12 PO), Take 1 capsule by mouth. 2061mg daily, Disp: , Rfl:  .  fluticasone (CUTIVATE) 0.05 % cream, Apply topically 2 (two) times daily., Disp: 60 g, Rfl: 2 .  levocetirizine (XYZAL) 5 MG tablet, Take 1 tablet (5 mg total) by mouth every evening., Disp: 90 tablet, Rfl: 2 .  Multiple Vitamin (MULTIVITAMIN) tablet, Take 1 tablet by mouth daily., Disp: , Rfl:  .  olmesartan-hydrochlorothiazide (BENICAR HCT) 20-12.5 MG tablet, TAKE 1 TABLET BY MOUTH ONCE DAILY., Disp: 90 tablet, Rfl: 1 .  rosuvastatin (CRESTOR) 10 MG tablet, TAKE (1) TABLET BY MOUTH DAILY., Disp: 90 tablet, Rfl: 2 .  SitaGLIPtin-MetFORMIN HCl ([name] XR) 50-1000 MG TB24, Take 1 tablet by mouth 2 (two) times daily., Disp: 30 tablet, Rfl: 1 .  tadalafil (CIALIS) 5 MG tablet, TAKE 1 TABLET BY MOUTH ONCE A DAY., Disp: 30 tablet, Rfl: 2   No Known Allergies   Review of Systems  Constitutional: Negative.   HENT: Positive for postnasal drip.  Eyes: Negative for blurred vision.  Respiratory: Negative.  Negative for shortness of breath.   Cardiovascular: Negative.  Negative for chest pain and palpitations.  Gastrointestinal: Negative.   Musculoskeletal: Positive for arthralgias.       He c/o bilateral hand stiffness.  He reports sx start upon awakening. Denies fall/trauma. Sx improve as the day progresses.   Skin: Negative.   Neurological: Negative.   Psychiatric/Behavioral: Negative.      Today's Vitals   03/09/20 1058   BP: 118/76  Pulse: 86  Temp: 98.1 F (36.7 C)  TempSrc: Oral  Weight: 234 lb (106.1 kg)  Height: 5' 11" (1.803 m)  PainSc: 0-No pain   Body mass index is 32.64 kg/m.   Objective:  Physical Exam Vitals and nursing note reviewed.  Constitutional:      Appearance: Normal appearance.  Cardiovascular:     Rate and Rhythm: Normal rate and regular rhythm.     Heart sounds: Normal heart sounds.  Pulmonary:     Effort: Pulmonary effort is normal.     Breath sounds: Normal breath sounds.  Skin:    General: Skin is warm.  Neurological:     General: No focal deficit present.     Mental Status: He is alert.  Psychiatric:        Mood and Affect: Mood normal.     Assessment And Plan:     1. Type 2 diabetes mellitus with stage 2 chronic kidney disease, with long-term current use of insulin (HCC) Comments: Chronic, I will check labs as listed below.  He is encouraged to incorporate more exercise into his daily routine.  He will rto in 4 months for re-evaluation.  - Hemoglobin A1c - BMP8+EGFR  2. Hypertensive nephropathy Comments: Chronic, well controlled. He is encouraged to avoid adding salt to her foods.   3. Stiffness of finger joint of right hand Comments: Sx are suggestive of OA.  He is advised to take Tylenol prn. If sx persist, I will check an arthritis panel?   4. Postnasal drip Comments: He will continue with levocetirizine 5mg nightly.   5. Class 1 obesity due to excess calories with serious comorbidity and body mass index (BMI) of 32.0 to 32.9 in adult Comments: He is encouraged to strive for BMI less than 30 to decrease cardiac risk. Advised to aim for at least 150 minutes of exercise per week.      Patient was given opportunity to ask questions. Patient verbalized understanding of the plan and was able to repeat key elements of the plan. All questions were answered to their satisfaction.  Robyn N [name], MD   I, Robyn N [name], MD, have reviewed all  documentation for this visit. The documentation on 03/09/20 for the exam, diagnosis, procedures, and orders are all accurate and complete.  THE PATIENT IS ENCOURAGED TO PRACTICE SOCIAL DISTANCING DUE TO THE COVID-19 PANDEMIC.   

## 2020-03-09 NOTE — Patient Instructions (Signed)
Diabetes Mellitus and Foot Care Foot care is an important part of your health, especially when you have diabetes. Diabetes may cause you to have problems because of poor blood flow (circulation) to your feet and legs, which can cause your skin to:  Become thinner and drier.  Break more easily.  Heal more slowly.  Peel and crack. You may also have nerve damage (neuropathy) in your legs and feet, causing decreased feeling in them. This means that you may not notice minor injuries to your feet that could lead to more serious problems. Noticing and addressing any potential problems early is the best way to prevent future foot problems. How to care for your feet Foot hygiene  Wash your feet daily with warm water and mild soap. Do not use hot water. Then, pat your feet and the areas between your toes until they are completely dry. Do not soak your feet as this can dry your skin.  Trim your toenails straight across. Do not dig under them or around the cuticle. File the edges of your nails with an emery board or nail file.  Apply a moisturizing lotion or petroleum jelly to the skin on your feet and to dry, brittle toenails. Use lotion that does not contain alcohol and is unscented. Do not apply lotion between your toes. Shoes and socks  Wear clean socks or stockings every day. Make sure they are not too tight. Do not wear knee-high stockings since they may decrease blood flow to your legs.  Wear shoes that fit properly and have enough cushioning. Always look in your shoes before you put them on to be sure there are no objects inside.  To break in new shoes, wear them for just a few hours a day. This prevents injuries on your feet. Wounds, scrapes, corns, and calluses  Check your feet daily for blisters, cuts, bruises, sores, and redness. If you cannot see the bottom of your feet, use a mirror or ask someone for help.  Do not cut corns or calluses or try to remove them with medicine.  If you  find a minor scrape, cut, or break in the skin on your feet, keep it and the skin around it clean and dry. You may clean these areas with mild soap and water. Do not clean the area with peroxide, alcohol, or iodine.  If you have a wound, scrape, corn, or callus on your foot, look at it several times a day to make sure it is healing and not infected. Check for: ? Redness, swelling, or pain. ? Fluid or blood. ? Warmth. ? Pus or a bad smell. General instructions  Do not cross your legs. This may decrease blood flow to your feet.  Do not use heating pads or hot water bottles on your feet. They may burn your skin. If you have lost feeling in your feet or legs, you may not know this is happening until it is too late.  Protect your feet from hot and cold by wearing shoes, such as at the beach or on hot pavement.  Schedule a complete foot exam at least once a year (annually) or more often if you have foot problems. If you have foot problems, report any cuts, sores, or bruises to your health care provider immediately. Contact a health care provider if:  You have a medical condition that increases your risk of infection and you have any cuts, sores, or bruises on your feet.  You have an injury that is not   healing.  You have redness on your legs or feet.  You feel burning or tingling in your legs or feet.  You have pain or cramps in your legs and feet.  Your legs or feet are numb.  Your feet always feel cold.  You have pain around a toenail. Get help right away if:  You have a wound, scrape, corn, or callus on your foot and: ? You have pain, swelling, or redness that gets worse. ? You have fluid or blood coming from the wound, scrape, corn, or callus. ? Your wound, scrape, corn, or callus feels warm to the touch. ? You have pus or a bad smell coming from the wound, scrape, corn, or callus. ? You have a fever. ? You have a red line going up your leg. Summary  Check your feet every day  for cuts, sores, red spots, swelling, and blisters.  Moisturize feet and legs daily.  Wear shoes that fit properly and have enough cushioning.  If you have foot problems, report any cuts, sores, or bruises to your health care provider immediately.  Schedule a complete foot exam at least once a year (annually) or more often if you have foot problems. This information is not intended to replace advice given to you by your health care provider. Make sure you discuss any questions you have with your health care provider. Document Revised: 03/20/2019 Document Reviewed: 07/29/2016 Elsevier Patient Education  2020 Elsevier Inc.  

## 2020-03-10 LAB — HEMOGLOBIN A1C
Est. average glucose Bld gHb Est-mCnc: 148 mg/dL
Hgb A1c MFr Bld: 6.8 % — ABNORMAL HIGH (ref 4.8–5.6)

## 2020-03-10 LAB — BMP8+EGFR
BUN/Creatinine Ratio: 12 (ref 10–24)
BUN: 15 mg/dL (ref 8–27)
CO2: 26 mmol/L (ref 20–29)
Calcium: 9.9 mg/dL (ref 8.6–10.2)
Chloride: 100 mmol/L (ref 96–106)
Creatinine, Ser: 1.22 mg/dL (ref 0.76–1.27)
GFR calc Af Amer: 70 mL/min/{1.73_m2} (ref >59)
GFR calc non Af Amer: 61 mL/min/{1.73_m2} (ref >59)
Glucose: 112 mg/dL — ABNORMAL HIGH (ref 65–99)
Potassium: 5.1 mmol/L (ref 3.5–5.2)
Sodium: 140 mmol/L (ref 134–144)

## 2020-03-11 LAB — SPECIMEN STATUS REPORT

## 2020-03-11 LAB — ANA: Anti Nuclear Antibody (ANA): NEGATIVE

## 2020-03-26 ENCOUNTER — Telehealth: Payer: Self-pay

## 2020-03-26 NOTE — Chronic Care Management (AMB) (Signed)
Chronic Care Management Pharmacy Assistant   Name: Andres [name]  MRN: [id number] DOB: Dec 21, 1950  Reason for Encounter: Disease State / Diabetes and Lipids  Patient Questions:  1.  Have you seen any other providers since your last visit? Yes, 03/09/2020 Dr. Glendale Chard  2.  Any changes in your medicines or health? No   PCP : Glendale Chard, MD  Allergies:  No Known Allergies  Medications: Outpatient Encounter Medications as of 03/26/2020  Medication Sig Note  . Accu-Chek FastClix Lancets MISC One tab po qd dx; e11.65   . ACCU-CHEK GUIDE test strip Use to check BS qd dx: E11.65   . Arginine 500 MG CAPS Take by mouth. Daily   . aspirin 81 MG tablet Take 81 mg by mouth daily.   . cholecalciferol (VITAMIN D) 1000 UNITS tablet Take 1,000 Units by mouth daily.   . Cyanocobalamin (VITAMIN B 12 PO) Take 1 capsule by mouth. 2079mcg daily   . fluticasone (CUTIVATE) 0.05 % cream Apply topically 2 (two) times daily.   [name] levocetirizine (XYZAL) 5 MG tablet Take 1 tablet (5 mg total) by mouth every evening.   . Multiple Vitamin (MULTIVITAMIN) tablet Take 1 tablet by mouth daily.   [name] olmesartan-hydrochlorothiazide (BENICAR HCT) 20-12.5 MG tablet TAKE 1 TABLET BY MOUTH ONCE DAILY.   . rosuvastatin (CRESTOR) 10 MG tablet TAKE (1) TABLET BY MOUTH DAILY. 11/25/2019: 6 days per week  . SitaGLIPtin-MetFORMIN HCl ([name] XR) 50-1000 MG TB24 Take 1 tablet by mouth 2 (two) times daily.   . tadalafil (CIALIS) 5 MG tablet TAKE 1 TABLET BY MOUTH ONCE A DAY.    No facility-administered encounter medications on file as of 03/26/2020.    Current Diagnosis: Patient Active Problem List   Diagnosis Date Noted  . Type 2 diabetes mellitus with stage 2 chronic kidney disease, with long-term current use of insulin ([place]) 07/23/2018  . Pure hypercholesterolemia 07/23/2018  . HTN (hypertension) 07/23/2018  . Nephropathy associated with another disease 02/13/2018   Recent Relevant Labs: Lab Results    Component Value Date/Time   HGBA1C 6.8 (H) 03/09/2020 11:54 AM   HGBA1C 6.6 (H) 11/07/2019 12:03 PM   HGBA1C 7.7 02/13/2018 12:00 AM   MICROALBUR 30 11/07/2019 11:30 AM   MICROALBUR 10 10/30/2018 10:29 AM    Kidney Function Lab Results  Component Value Date/Time   CREATININE 1.22 03/09/2020 11:54 AM   CREATININE 1.30 (H) 11/07/2019 12:03 PM   GFRNONAA 61 03/09/2020 11:54 AM   GFRAA 70 03/09/2020 11:54 AM    . Current antihyperglycemic regimen:  o [name] XR 50-1000 mg one tablet two times a day . What recent interventions/DTPs have been made to improve glycemic control:  Receiving [name] XR from [employer] patient assistance, Patient has improved his diet and exercise and also is checking his blood sugars at bedtime per recommendations from Pharmacists.  . Have there been any recent hospitalizations or ED visits since last visit with CPP? No   . Patient denies hypoglycemic symptoms, including Pale, Sweaty, Shaky, Hungry, Nervous/irritable and Vision changes . Patient denies hyperglycemic symptoms, including blurry vision, excessive thirst, fatigue, polyuria and weakness  .  [name] How often are you checking your blood sugar? twice daily . What are your blood sugars ranging?  o Fasting: 03/26/2020 - 117 o Before meals: none o After meals: 03/25/2020- 112 o Bedtime: 03/25/2020 -  117 . During the week, how often does your blood glucose drop below 70? Never . Are you checking your feet  daily/regularly? Yes, Patient checks feet regularly, no sores, swelling and no pain.  Adherence Review: Is the patient currently on a STATIN medication? Yes. Rosuvastatin 10 mg  Is the patient currently on ACE/ARB medication? Yes. Benicar- HCTZ Does the patient have >5 day gap between last estimated fill dates? No   Comprehensive medication review performed; Spoke to patient regarding cholesterol  Lipid Panel    Component Value Date/Time   CHOL 82 (L) 11/07/2019 1203   TRIG 109 11/07/2019 1203    HDL 30 (L) 11/07/2019 1203   LDLCALC 32 11/07/2019 1203    10-year ASCVD risk score: The ASCVD Risk score Mikey Bussing DC Jr., et al., 2013) failed to calculate for the following reasons:   The valid total cholesterol range is 130 to 320 mg/dL  . Current antihyperlipidemic regimen:  o Rosuvastatin 10 mg tablet one tablet six days a week . Previous antihyperlipidemic medications tried: none . ASCVD risk enhancing conditions: age >1, DM, HTN, CKD, CHF, current smoker . What recent interventions/DTPs have been made by any provider to improve Cholesterol control since last CPP Visit: Patient is taking rosuvastatin, with diet and exercise.  . Any recent hospitalizations or ED visits since last visit with CPP? No . What diet changes have been made to improve Cholesterol?  o Patient states he eats two meals a day breakfast and  Dinner,he has a small snack for lunch. Patient states that he  rarely eats red meats, he eats fish / chicken and salads.  . What exercise is being done to improve Cholesterol?  o Patient states he does walk track two times a week, also does yard work. Patient said he is going to start walking to end of lane each day, because the track is not available to him as much since Covid.  Adherence Review: Does the patient have >5 day gap between last estimated fill dates? No   Goals Addressed            This Visit's Progress   . Pharmacy Care Plan   On track    CARE PLAN ENTRY  Current Barriers:  . Chronic Disease Management support, education, and care coordination needs related to Hypertension, Hyperlipidemia, Diabetes, and Chronic Kidney Disease   Hypertension . Pharmacist Clinical Goal(s): o Over the next 180 days, patient will work with PharmD and providers to maintain BP goal <130/80 . Current regimen:  o Olmesartan/HCTZ 20/12.5mg  daily . Interventions: o Recommend patient check blood pressure periodically and if symptomatic and record for provider o Provided dietary  and exercise recommendations . Patient self care activities - Over the next 90 days, patient will: o Check BP periodically and if symptomatic, document, and provide at future appointments o Ensure daily salt intake < 2300 mg/day o Try to exercise for 30 minutes daily 5 times per week  Hyperlipidemia . Pharmacist Clinical Goal(s): o Over the next 180 days, patient will work with PharmD and providers to maintain LDL goal < 70 . Current regimen:  o Rosuvastatin 10mg  daily, 6 days per week . Interventions: o Discussed increase in healthy fats in diet and increase in exercise to help improve HDL (good cholesterol) o Provided dietary and exercise recommendations . Patient self care activities - Over the next 90 days, patient will: o Take medications daily as directed o Increase dietary intake of healthy fats (avocados, nuts, flaxseed, etc) o Try to exercise for 30 minutes daily 5 times per week  Diabetes . Pharmacist Clinical Goal(s): o Over the next 90 days,  patient will work with PharmD and providers to maintain A1c goal <7% . Current regimen:  o [name] XR 50-1000mg  twice daily . Interventions: o Patient has received [name] XR through the mail from [employer] patient assistance program. Continue to assist as needed o Provided dietary and exercise recommendations Patient self care activities - Over the next 90 days, patient will: o Check blood sugar at bedtime, document, and provide at future appointments o Contact provider with any episodes of hypoglycemia o Focus on eating well-balanced meals  Medication management . Pharmacist Clinical Goal(s): o Over the next 90 days, patient will work with PharmD and providers to achieve optimal medication adherence . Current pharmacy: [facility] . Interventions o Comprehensive medication review performed. o Continue current medication management strategy o Provided information regarding medication cost and mail order option o Coordinate  refills for fluticasone cream and levocetirizine with PCP staff . Patient self care activities - Over the next 90 days, patient will: o Take medications as prescribed o Report any questions or concerns to PharmD and/or provider(s)  Please see past updates related to this goal by clicking on the "Past Updates" button in the selected goal         Follow-Up:  Pharmacist Review - Patient had no complaints. Patient has been taking his medications as directed and has been exercising and eating better.  Jannette Fogo, CPP Notified   Judithann Sheen, [facility] Clinical Pharmacist Assistant [phone]

## 2020-04-06 ENCOUNTER — Telehealth: Payer: Self-pay

## 2020-04-06 ENCOUNTER — Telehealth: Payer: Medicare HMO

## 2020-04-06 NOTE — Telephone Encounter (Cosign Needed)
  Chronic Care Management   Outreach Note  04/06/2020 Name: Andres [name] MRN: [id number] DOB: 08-09-1950  Referred by: Glendale Chard, MD Reason for referral : Chronic Care Management (FU RN CM Call )   An unsuccessful telephone outreach was attempted today. The patient was referred to the case management team for assistance with care management and care coordination.   Follow Up Plan: A HIPAA compliant phone message was left for the patient providing contact information and requesting a return call.  Telephone follow up appointment with care management team member scheduled for: 05/12/20  Barb Merino, RN, BSN, CCM Care Management Coordinator [place] [facility]  Direct Phone: [phone]

## 2020-05-08 ENCOUNTER — Other Ambulatory Visit: Payer: Self-pay

## 2020-05-08 ENCOUNTER — Ambulatory Visit (INDEPENDENT_AMBULATORY_CARE_PROVIDER_SITE_OTHER): Payer: Medicare HMO

## 2020-05-08 VITALS — BP 122/78 | HR 80 | Temp 98.2°F | Ht 71.0 in

## 2020-05-08 DIAGNOSIS — Z23 Encounter for immunization: Secondary | ICD-10-CM

## 2020-05-12 ENCOUNTER — Telehealth: Payer: Medicare HMO

## 2020-05-12 ENCOUNTER — Telehealth: Payer: Self-pay

## 2020-05-12 NOTE — Telephone Encounter (Cosign Needed)
  Chronic Care Management   Outreach Note  05/12/2020 Name: Andres [name] MRN: [id number] DOB: 10-07-1950  Referred by: Glendale Chard, MD Reason for referral : Chronic Care Management (CCM RNCM FU Call )   A second unsuccessful telephone outreach was attempted today. The patient was referred to the case management team for assistance with care management and care coordination.   Follow Up Plan: A HIPAA compliant phone message was left for the patient providing contact information and requesting a return call.  Telephone follow up appointment with care management team member scheduled for: 06/17/20  Barb Merino, RN, BSN, CCM Care Management Coordinator [place] [facility]  Direct Phone: [phone]

## 2020-05-22 ENCOUNTER — Telehealth: Payer: Self-pay

## 2020-05-22 ENCOUNTER — Ambulatory Visit (INDEPENDENT_AMBULATORY_CARE_PROVIDER_SITE_OTHER): Payer: Medicare HMO

## 2020-05-22 ENCOUNTER — Other Ambulatory Visit: Payer: Self-pay

## 2020-05-22 DIAGNOSIS — Z23 Encounter for immunization: Secondary | ICD-10-CM | POA: Diagnosis not present

## 2020-05-22 NOTE — Chronic Care Management (AMB) (Signed)
    Chronic Care Management Pharmacy Assistant   Name: Andres [name]  MRN: [id number] DOB: 1951/05/22  Reason for Encounter: Patient Assistance Coordination  PCP : Glendale Chard, MD   05/22/2020- Patient needs renewal of [name] XR 50/1000 mg from [employer] Patient Assistance Program. New form printed, awaiting provider signature, patient given 1 month sample supply.   Allergies:  No Known Allergies  Medications: Outpatient Encounter Medications as of 05/22/2020  Medication Sig Note  . Accu-Chek FastClix Lancets MISC One tab po qd dx; e11.65   . ACCU-CHEK GUIDE test strip Use to check BS qd dx: E11.65   . Arginine 500 MG CAPS Take by mouth. Daily   . aspirin 81 MG tablet Take 81 mg by mouth daily.   . cholecalciferol (VITAMIN D) 1000 UNITS tablet Take 1,000 Units by mouth daily.   . Cyanocobalamin (VITAMIN B 12 PO) Take 1 capsule by mouth. 2057mcg daily   . fluticasone (CUTIVATE) 0.05 % cream Apply topically 2 (two) times daily.   [name] levocetirizine (XYZAL) 5 MG tablet Take 1 tablet (5 mg total) by mouth every evening.   . Multiple Vitamin (MULTIVITAMIN) tablet Take 1 tablet by mouth daily.   [name] olmesartan-hydrochlorothiazide (BENICAR HCT) 20-12.5 MG tablet TAKE 1 TABLET BY MOUTH ONCE DAILY.   . rosuvastatin (CRESTOR) 10 MG tablet TAKE (1) TABLET BY MOUTH DAILY. 11/25/2019: 6 days per week  . SitaGLIPtin-MetFORMIN HCl ([name] XR) 50-1000 MG TB24 Take 1 tablet by mouth 2 (two) times daily.   . tadalafil (CIALIS) 5 MG tablet TAKE 1 TABLET BY MOUTH ONCE A DAY.    No facility-administered encounter medications on file as of 05/22/2020.    Current Diagnosis: Patient Active Problem List   Diagnosis Date Noted  . Type 2 diabetes mellitus with stage 2 chronic kidney disease, with long-term current use of insulin ([place]) 07/23/2018  . Pure hypercholesterolemia 07/23/2018  . HTN (hypertension) 07/23/2018  . Nephropathy associated with another disease 02/13/2018    Follow-Up:  Patient  Assistance Coordination   05/28/2020- [employer] Patient assistance form filled out and signed by provider for patient's [name] XR. Patient signature and income amount needed in order to mail to assistance program.  05/30/2020- Called patient regarding [name] XR patient assistance forms and needing signature and income amount to mail. No return call from patient, will place form in mail for patient to sign and sent to [employer].  [name], CPP notified.  [name], [place] Pharmacist Assistant [phone]

## 2020-05-22 NOTE — Progress Notes (Signed)
   Covid-19 Vaccination Clinic  Name:  Andres [name]    MRN: [id number] DOB: 1950-11-28  05/22/2020  Andres [name] was observed post Covid-19 immunization for 15 minutes without incident. He was provided with Vaccine Information Sheet and instruction to access the V-Safe system.   Andres [name] was instructed to call 911 with any severe reactions post vaccine: [name] Difficulty breathing  . Swelling of face and throat  . A fast heartbeat  . A bad rash all over body  . Dizziness and weakness

## 2020-06-02 ENCOUNTER — Ambulatory Visit: Payer: Self-pay

## 2020-06-17 ENCOUNTER — Telehealth: Payer: Medicare HMO

## 2020-06-19 ENCOUNTER — Other Ambulatory Visit: Payer: Self-pay | Admitting: Internal Medicine

## 2020-06-25 DIAGNOSIS — E119 Type 2 diabetes mellitus without complications: Secondary | ICD-10-CM | POA: Diagnosis not present

## 2020-06-25 LAB — HM DIABETES EYE EXAM

## 2020-06-29 ENCOUNTER — Ambulatory Visit (INDEPENDENT_AMBULATORY_CARE_PROVIDER_SITE_OTHER): Payer: Medicare HMO | Admitting: Internal Medicine

## 2020-06-29 ENCOUNTER — Other Ambulatory Visit: Payer: Self-pay

## 2020-06-29 ENCOUNTER — Encounter: Payer: Self-pay | Admitting: Internal Medicine

## 2020-06-29 VITALS — BP 118/76 | HR 105 | Temp 98.3°F | Ht 71.0 in | Wt 234.4 lb

## 2020-06-29 DIAGNOSIS — I129 Hypertensive chronic kidney disease with stage 1 through stage 4 chronic kidney disease, or unspecified chronic kidney disease: Secondary | ICD-10-CM | POA: Diagnosis not present

## 2020-06-29 DIAGNOSIS — E1122 Type 2 diabetes mellitus with diabetic chronic kidney disease: Secondary | ICD-10-CM

## 2020-06-29 DIAGNOSIS — R000 Tachycardia, unspecified: Secondary | ICD-10-CM

## 2020-06-29 DIAGNOSIS — N182 Chronic kidney disease, stage 2 (mild): Secondary | ICD-10-CM

## 2020-06-29 DIAGNOSIS — E6609 Other obesity due to excess calories: Secondary | ICD-10-CM

## 2020-06-29 DIAGNOSIS — Z6832 Body mass index (BMI) 32.0-32.9, adult: Secondary | ICD-10-CM | POA: Diagnosis not present

## 2020-06-29 DIAGNOSIS — Z794 Long term (current) use of insulin: Secondary | ICD-10-CM

## 2020-06-29 NOTE — Progress Notes (Signed)
Andres [name],Andres [name],acting as a Education administrator for [name], MD.,have documented all relevant documentation on the behalf of [name], MD,as directed by  [name], MD while in the presence of [name], MD.  This visit occurred during the SARS-CoV-2 public health emergency.  Safety protocols were in place, including screening questions prior to the visit, additional usage of staff PPE, and extensive cleaning of exam room while observing appropriate contact time as indicated for disinfecting solutions.  Subjective:     Patient ID: Andres [name] , male    DOB: 1951/03/21 , 69 y.o.   MRN: [id number]   Chief Complaint  Patient presents with  . Diabetes  . Hypertension    HPI  He presents for DM/HTN check. He reports compliance with meds. He denies headaches, chest pain and shortness of breath. He is happy to announce he has new grandson, turned one month old today. He lives in [place], and he is going down Wednesday for the holiday weekend.   Diabetes He presents for his follow-up diabetic visit. He has type 2 diabetes mellitus. His disease course has been stable. There are no hypoglycemic associated symptoms. Pertinent negatives for diabetes include no blurred vision and no chest pain. There are no hypoglycemic complications. Diabetic complications include nephropathy. Risk factors for coronary artery disease include diabetes mellitus, dyslipidemia, hypertension and male sex. He is following a diabetic diet. His breakfast blood glucose is taken between 8-9 am. His breakfast blood glucose range is generally 90-110 mg/dl. An ACE inhibitor/angiotensin II receptor blocker is being taken. Eye exam is not current.  Hypertension This is a chronic problem. The current episode started more than 1 year ago. The problem has been gradually improving since onset. The problem is controlled. Pertinent negatives include no blurred vision, chest pain, palpitations or shortness of breath. Past  treatments include angiotensin blockers and diuretics. The current treatment provides moderate improvement.     Past Medical History:  Diagnosis Date  . Diabetes ([place])   . High cholesterol   . Hypertension    states under control with med., has been on med. x 5 yr.  . Seasonal allergies   . Umbilical hernia 03/239     Family History  Problem Relation Age of Onset  . Healthy Mother   . Early death Father      Current Outpatient Medications:  .  Accu-Chek FastClix Lancets MISC, One tab po qd dx; e11.65, Disp: 100 each, Rfl: 11 .  ACCU-CHEK GUIDE test strip, Use to check BS qd dx: E11.65, Disp: 100 each, Rfl: 11 .  Arginine 500 MG CAPS, Take by mouth. Daily, Disp: , Rfl:  .  aspirin 81 MG tablet, Take 81 mg by mouth daily., Disp: , Rfl:  .  cholecalciferol (VITAMIN D) 1000 UNITS tablet, Take 4,000 Units by mouth daily., Disp: , Rfl:  .  Cyanocobalamin (VITAMIN B 12 PO), Take 1 capsule by mouth. 2053mg daily, Disp: , Rfl:  .  fluticasone (CUTIVATE) 0.05 % cream, Apply topically 2 (two) times daily., Disp: 60 g, Rfl: 2 .  levocetirizine (XYZAL) 5 MG tablet, Take 1 tablet (5 mg total) by mouth every evening., Disp: 90 tablet, Rfl: 2 .  Multiple Vitamin (MULTIVITAMIN) tablet, Take 1 tablet by mouth daily., Disp: , Rfl:  .  olmesartan-hydrochlorothiazide (BENICAR HCT) 20-12.5 MG tablet, TAKE 1 TABLET BY MOUTH ONCE DAILY., Disp: 90 tablet, Rfl: 1 .  rosuvastatin (CRESTOR) 10 MG tablet, TAKE (1) TABLET BY MOUTH DAILY., Disp: 90  tablet, Rfl: 0 .  SitaGLIPtin-MetFORMIN HCl ([name] XR) 50-1000 MG TB24, Take 1 tablet by mouth 2 (two) times daily., Disp: 30 tablet, Rfl: 1 .  tadalafil (CIALIS) 5 MG tablet, TAKE 1 TABLET BY MOUTH ONCE A DAY., Disp: 30 tablet, Rfl: 2   No Known Allergies   Review of Systems  Constitutional: Negative.   Eyes: Negative for blurred vision.  Respiratory: Negative.  Negative for shortness of breath.   Cardiovascular: Negative.  Negative for chest pain and  palpitations.  Gastrointestinal: Negative.   Neurological: Negative.   Psychiatric/Behavioral: Negative.      Today's Vitals   06/29/20 1441  BP: 118/76  Pulse: (!) 105  Temp: 98.3 F (36.8 C)  TempSrc: Oral  Weight: 234 lb 6.4 oz (106.3 kg)  Height: 5' 11"  (1.803 m)  PainSc: 0-No pain   Body mass index is 32.69 kg/m.  Wt Readings from Last 3 Encounters:  06/29/20 234 lb 6.4 oz (106.3 kg)  03/09/20 234 lb (106.1 kg)  11/07/19 236 lb 9.6 oz (107.3 kg)   Objective:  Physical Exam Vitals and nursing note reviewed.  Constitutional:      Appearance: Normal appearance.  HENT:     Head: Normocephalic and atraumatic.  Cardiovascular:     Rate and Rhythm: Normal rate and regular rhythm.     Heart sounds: Normal heart sounds.  Pulmonary:     Effort: Pulmonary effort is normal.     Breath sounds: Normal breath sounds.  Skin:    General: Skin is warm.  Neurological:     General: No focal deficit present.     Mental Status: He is alert.  Psychiatric:        Mood and Affect: Mood normal.         Assessment And Plan:     1. Type 2 diabetes mellitus with stage 2 chronic kidney disease, with long-term current use of insulin (HCC) Comments: Chronic, Andres [name] will check labs as listed below. He was commended on maintaining great BS control.  - Hemoglobin A1c - CMP14+EGFR  2. Hypertensive nephropathy Comments: Chronic, well controlled. She will continue with current meds. She is encourage to avoid adding salt to his foods.   3. Tachycardia Comments: He admits he has been "running around" today. He is encouraged to increase his water intake. Andres [name] will check Mg and TSH levels today.  - Magnesium - TSH  4. Class 1 obesity due to excess calories with serious comorbidity and body mass index (BMI) of 32.0 to 32.9 in adult Comments: He is encouraged to strive for BMi less than 30 to decrease cardiac risk. Advised to aim for at least 150 minutes of exercise per week.  He is encouraged to  initially strive for BMI less than 30 to decrease cardiac risk. He is advised to exercise no less than 150 minutes per week.     Patient was given opportunity to ask questions. Patient verbalized understanding of the plan and was able to repeat key elements of the plan. All questions were answered to their satisfaction.  [name], MD   Andres [name], [name], MD, have reviewed all documentation for this visit. The documentation on 06/29/20 for the exam, diagnosis, procedures, and orders are all accurate and complete.  THE PATIENT IS ENCOURAGED TO PRACTICE SOCIAL DISTANCING DUE TO THE COVID-19 PANDEMIC.

## 2020-06-29 NOTE — Patient Instructions (Signed)
Diabetes Mellitus and Foot Care Foot care is an important part of your health, especially when you have diabetes. Diabetes may cause you to have problems because of poor blood flow (circulation) to your feet and legs, which can cause your skin to:  Become thinner and drier.  Break more easily.  Heal more slowly.  Peel and crack. You may also have nerve damage (neuropathy) in your legs and feet, causing decreased feeling in them. This means that you may not notice minor injuries to your feet that could lead to more serious problems. Noticing and addressing any potential problems early is the best way to prevent future foot problems. How to care for your feet Foot hygiene  Wash your feet daily with warm water and mild soap. Do not use hot water. Then, pat your feet and the areas between your toes until they are completely dry. Do not soak your feet as this can dry your skin.  Trim your toenails straight across. Do not dig under them or around the cuticle. File the edges of your nails with an emery board or nail file.  Apply a moisturizing lotion or petroleum jelly to the skin on your feet and to dry, brittle toenails. Use lotion that does not contain alcohol and is unscented. Do not apply lotion between your toes. Shoes and socks  Wear clean socks or stockings every day. Make sure they are not too tight. Do not wear knee-high stockings since they may decrease blood flow to your legs.  Wear shoes that fit properly and have enough cushioning. Always look in your shoes before you put them on to be sure there are no objects inside.  To break in new shoes, wear them for just a few hours a day. This prevents injuries on your feet. Wounds, scrapes, corns, and calluses  Check your feet daily for blisters, cuts, bruises, sores, and redness. If you cannot see the bottom of your feet, use a mirror or ask someone for help.  Do not cut corns or calluses or try to remove them with medicine.  If you  find a minor scrape, cut, or break in the skin on your feet, keep it and the skin around it clean and dry. You may clean these areas with mild soap and water. Do not clean the area with peroxide, alcohol, or iodine.  If you have a wound, scrape, corn, or callus on your foot, look at it several times a day to make sure it is healing and not infected. Check for: ? Redness, swelling, or pain. ? Fluid or blood. ? Warmth. ? Pus or a bad smell. General instructions  Do not cross your legs. This may decrease blood flow to your feet.  Do not use heating pads or hot water bottles on your feet. They may burn your skin. If you have lost feeling in your feet or legs, you may not know this is happening until it is too late.  Protect your feet from hot and cold by wearing shoes, such as at the beach or on hot pavement.  Schedule a complete foot exam at least once a year (annually) or more often if you have foot problems. If you have foot problems, report any cuts, sores, or bruises to your health care provider immediately. Contact a health care provider if:  You have a medical condition that increases your risk of infection and you have any cuts, sores, or bruises on your feet.  You have an injury that is not   healing.  You have redness on your legs or feet.  You feel burning or tingling in your legs or feet.  You have pain or cramps in your legs and feet.  Your legs or feet are numb.  Your feet always feel cold.  You have pain around a toenail. Get help right away if:  You have a wound, scrape, corn, or callus on your foot and: ? You have pain, swelling, or redness that gets worse. ? You have fluid or blood coming from the wound, scrape, corn, or callus. ? Your wound, scrape, corn, or callus feels warm to the touch. ? You have pus or a bad smell coming from the wound, scrape, corn, or callus. ? You have a fever. ? You have a red line going up your leg. Summary  Check your feet every day  for cuts, sores, red spots, swelling, and blisters.  Moisturize feet and legs daily.  Wear shoes that fit properly and have enough cushioning.  If you have foot problems, report any cuts, sores, or bruises to your health care provider immediately.  Schedule a complete foot exam at least once a year (annually) or more often if you have foot problems. This information is not intended to replace advice given to you by your health care provider. Make sure you discuss any questions you have with your health care provider. Document Revised: 03/20/2019 Document Reviewed: 07/29/2016 Elsevier Patient Education  2020 Elsevier Inc.  

## 2020-06-30 LAB — CMP14+EGFR
ALT: 29 IU/L (ref 0–44)
AST: 20 IU/L (ref 0–40)
Albumin/Globulin Ratio: 1.4 (ref 1.2–2.2)
Albumin: 4.6 g/dL (ref 3.8–4.8)
Alkaline Phosphatase: 64 IU/L (ref 44–121)
BUN/Creatinine Ratio: 9 — ABNORMAL LOW (ref 10–24)
BUN: 11 mg/dL (ref 8–27)
Bilirubin Total: 0.3 mg/dL (ref 0.0–1.2)
CO2: 24 mmol/L (ref 20–29)
Calcium: 9.5 mg/dL (ref 8.6–10.2)
Chloride: 98 mmol/L (ref 96–106)
Creatinine, Ser: 1.21 mg/dL (ref 0.76–1.27)
GFR calc Af Amer: 71 mL/min/{1.73_m2} (ref >59)
GFR calc non Af Amer: 62 mL/min/{1.73_m2} (ref >59)
Globulin, Total: 3.2 g/dL (ref 1.5–4.5)
Glucose: 95 mg/dL (ref 65–99)
Potassium: 4.5 mmol/L (ref 3.5–5.2)
Sodium: 139 mmol/L (ref 134–144)
Total Protein: 7.8 g/dL (ref 6.0–8.5)

## 2020-06-30 LAB — MAGNESIUM: Magnesium: 1.7 mg/dL (ref 1.6–2.3)

## 2020-06-30 LAB — HEMOGLOBIN A1C
Est. average glucose Bld gHb Est-mCnc: 154 mg/dL
Hgb A1c MFr Bld: 7 % — ABNORMAL HIGH (ref 4.8–5.6)

## 2020-06-30 LAB — TSH: TSH: 2.41 u[IU]/mL (ref 0.450–4.500)

## 2020-07-01 ENCOUNTER — Encounter: Payer: Self-pay | Admitting: Internal Medicine

## 2020-07-13 ENCOUNTER — Telehealth: Payer: Self-pay

## 2020-07-13 NOTE — Chronic Care Management (AMB) (Signed)
Chronic Care Management Pharmacy Assistant   Name: DJIMON LUNDSTROM  MRN: [id number] DOB: Jul 14, 1950  Reason for Encounter: Patent Assistance Coordination  PCP : Dorothyann Peng, MD   07/13/2020- Called patient and inquired if he received an attestation form from [employer] patient assistance program. Per [employer] conversation 12/21 patient was approved and attestation form was sent out to patient on 06/26/2020 that he will need to return to [employer]. Patient states he did receive a few forms from [employer] and took them to Dr Allyne Gee office. He has signed and completed his portions. Patient aware that I will notify Cherylin Mylar, CPP regarding forms and I will also go by the office to pick up and send to [employer] Patient assistance program. Patient inquired if there were samples of [name] XR 50/1000 mg at the office. Patient aware that I will ask PCP office if those are available, patient states he will be by the office on 07/15/2020, informed patient I should know more by 07/14/2020 about samples.    Allergies:  No Known Allergies  Medications: Outpatient Encounter Medications as of 07/13/2020  Medication Sig   Accu-Chek FastClix Lancets MISC One tab po qd dx; e11.65   ACCU-CHEK GUIDE test strip Use to check BS qd dx: E11.65   Arginine 500 MG CAPS Take by mouth. Daily   aspirin 81 MG tablet Take 81 mg by mouth daily.   cholecalciferol (VITAMIN D) 1000 UNITS tablet Take 4,000 Units by mouth daily.   Cyanocobalamin (VITAMIN B 12 PO) Take 1 capsule by mouth. daily   fluticasone (CUTIVATE) 0.05 % cream Apply topically 2 (two) times daily.   levocetirizine (XYZAL) 5 MG tablet Take 1 tablet (5 mg total) by mouth every evening.   Multiple Vitamin (MULTIVITAMIN) tablet Take 1 tablet by mouth daily.   olmesartan-hydrochlorothiazide (BENICAR HCT) 20-12.5 MG tablet TAKE 1 TABLET BY MOUTH ONCE DAILY.   rosuvastatin (CRESTOR) 10 MG tablet TAKE (1) TABLET BY MOUTH DAILY.   SitaGLIPtin-MetFORMIN HCl  ([name] XR) 50-1000 MG TB24 Take 1 tablet by mouth 2 (two) times daily.   tadalafil (CIALIS) 5 MG tablet TAKE 1 TABLET BY MOUTH ONCE A DAY.   No facility-administered encounter medications on file as of 07/13/2020.    Current Diagnosis: Patient Active Problem List   Diagnosis Date Noted   Type 2 diabetes mellitus with stage 2 chronic kidney disease, with long-term current use of insulin (HCC) 07/23/2018   Pure hypercholesterolemia 07/23/2018   HTN (hypertension) 07/23/2018   Nephropathy associated with another disease 02/13/2018     Follow-Up:  Patient Assistance Coordination  07/15/2020- Received forms from PCP office and able to review, Attestation Form and new application for [name] from [employer] Patient assistance program. Per [employer] letter that is dated 06/25/20 attestation form will need to be returned by 07/10/20 or patient will need to submit a new enrollment form.  Message received from [facility], RN (CCM) that patient came to the office today to pick up samples of [name] XR 50/1000 mg. Samples were not available at office. Patient was upset because he drove 45 minutes to pick up samples. Called patient to apologize for not contacting in time before he arrived to office to check on if samples were available. Patient understood and appreciated me giving him a call. Patient aware that I have forms and will send these off tomorrow and informed we are doing a new enrollment due to being after the due date. Patient ok with this plan. Patient also aware that  I will discuss with [name], CPP regarding samples first thing tomorrow morning 07/16/20 and I will also stop by PCP office to see if samples are available or if a prescription can be sent to his local pharmacy so he does not run out of medications.  [name], CPP notified.  07/16/2020- Patient aware office has samples and are placed at the front desk for pick up. Patient states he will be by on Monday.  [name],  [place] Pharmacist Assistant [phone]

## 2020-07-14 ENCOUNTER — Telehealth: Payer: Medicare HMO

## 2020-07-14 ENCOUNTER — Other Ambulatory Visit: Payer: Self-pay

## 2020-07-14 ENCOUNTER — Ambulatory Visit: Payer: Self-pay

## 2020-07-14 DIAGNOSIS — I129 Hypertensive chronic kidney disease with stage 1 through stage 4 chronic kidney disease, or unspecified chronic kidney disease: Secondary | ICD-10-CM

## 2020-07-14 DIAGNOSIS — E1122 Type 2 diabetes mellitus with diabetic chronic kidney disease: Secondary | ICD-10-CM

## 2020-07-14 DIAGNOSIS — N182 Chronic kidney disease, stage 2 (mild): Secondary | ICD-10-CM

## 2020-07-14 DIAGNOSIS — E7800 Pure hypercholesterolemia, unspecified: Secondary | ICD-10-CM

## 2020-07-15 ENCOUNTER — Telehealth: Payer: Self-pay

## 2020-07-15 ENCOUNTER — Telehealth: Payer: Medicare HMO

## 2020-07-15 NOTE — Telephone Encounter (Cosign Needed)
  Chronic Care Management   Outreach Note  07/15/2020 Name: Andres [name] MRN: [id number] DOB: 05-27-1951  Referred by: Dorothyann Peng, MD Reason for referral : Chronic Care Management (Inbound Call from patient )   Voice message received from patient stating he drove to the office for samples of [name] but was not provided samples. Patient is requesting a call back from pharmacy to advise of next steps since his supply is low and he is awaiting approval for PAP. Sent secure message to Cherylin Mylar Pharm D making her aware and requesting she f/u with patient. The patient was referred to the case management team for assistance with care management and care coordination.   Follow Up Plan: Telephone follow up appointment with care management team member scheduled for: 07/16/20  Delsa Sale, RN, BSN, CCM Care Management Coordinator [facility] Care Management/[facility]  Direct Phone: [phone]

## 2020-07-16 ENCOUNTER — Telehealth: Payer: Medicare HMO

## 2020-07-16 NOTE — Patient Instructions (Signed)
Goals      Other   .  Exercise 150 min/wk Moderate Activity    .  Medication adherence with [name]      Timeframe:  Long-Range Goal Priority:  High Start Date:  07/14/20                           Expected End Date:  10/12/20  Follow up date: 09/08/20   Over the next 90 days, patient will:  - work with embedded Pharm D for PAP assistance for [name] - obtain samples from PCP when supply is low if needed  - refill/obtain samples when supply is getting low but before running out of medication - take medication exactly as prescribed by PCP                        .  Monitor and Manage My Blood Sugar-Diabetes Type 2      Timeframe:  Long-Range Goal Priority:  High Start Date: 07/14/20                            Expected End Date: 10/12/20                 Follow Up Date  09/08/20   - check blood sugar at prescribed times - check blood sugar before and after exercise - check blood sugar if I feel it is too high or too low - take the blood sugar log to all doctor visits - take the blood sugar meter to all doctor visits    Why is this important?    Checking your blood sugar at home helps to keep it from getting very high or very low.   Writing the results in a diary or log helps the doctor know how to care for you.   Your blood sugar log should have the time, date and the results.   Also, write down the amount of insulin or other medicine that you take.   Other information, like what you ate, exercise done and how you were feeling, will also be helpful.     Notes:         The patient verbalized understanding of instructions, educational materials, and care plan provided today and declined offer to receive copy of patient instructions, educational materials, and care plan.   Telephone follow up appointment with care management team member scheduled for: 09/08/20  Riley Churches, RN

## 2020-07-16 NOTE — Chronic Care Management (AMB) (Signed)
Chronic Care Management   Follow Up Note   07/16/2020 Name: Andres [name] MRN: [id number] DOB: 1950-11-26  Referred by: Glendale Chard, MD Reason for referral : Chronic Care Management (RN CM FU Call )   CHAMAR BROUGHTON is a 70 y.o. year old male who is a primary care patient of Glendale Chard, MD. The CCM team was consulted for assistance with chronic disease management and care coordination needs.    Review of patient status, including review of consultants reports, relevant laboratory and other test results, and collaboration with appropriate care team members and the patient's provider was performed as part of comprehensive patient evaluation and provision of chronic care management services.    SDOH (Social Determinants of Health) assessments performed: Yes - PAP assistance needed for [place] activities for detailed interventions related to [place])   Placed outbound CCM RN CM follow up call to patient for a care plan update.     Outpatient Encounter Medications as of 07/14/2020  Medication Sig  . Accu-Chek FastClix Lancets MISC One tab po qd dx; e11.65  . ACCU-CHEK GUIDE test strip Use to check BS qd dx: E11.65  . Arginine 500 MG CAPS Take by mouth. Daily  . aspirin 81 MG tablet Take 81 mg by mouth daily.  . cholecalciferol (VITAMIN D) 1000 UNITS tablet Take 4,000 Units by mouth daily.  . Cyanocobalamin (VITAMIN B 12 PO) Take 1 capsule by mouth. 2057mg daily  . fluticasone (CUTIVATE) 0.05 % cream Apply topically 2 (two) times daily.  .Marland Kitchenlevocetirizine (XYZAL) 5 MG tablet Take 1 tablet (5 mg total) by mouth every evening.  . Multiple Vitamin (MULTIVITAMIN) tablet Take 1 tablet by mouth daily.  .Marland Kitchenolmesartan-hydrochlorothiazide (BENICAR HCT) 20-12.5 MG tablet TAKE 1 TABLET BY MOUTH ONCE DAILY.  . rosuvastatin (CRESTOR) 10 MG tablet TAKE (1) TABLET BY MOUTH DAILY.  .Marland KitchenSitaGLIPtin-MetFORMIN HCl ([name] XR) 50-1000 MG TB24 Take 1 tablet by mouth 2 (two) times daily.  .  tadalafil (CIALIS) 5 MG tablet TAKE 1 TABLET BY MOUTH ONCE A DAY.   No facility-administered encounter medications on file as of 07/14/2020.     Objective:  Lab Results  Component Value Date   HGBA1C 7.0 (H) 06/29/2020   HGBA1C 6.8 (H) 03/09/2020   HGBA1C 6.6 (H) 11/07/2019   Lab Results  Component Value Date   MICROALBUR 30 11/07/2019   LDLCALC 32 11/07/2019   CREATININE 1.21 06/29/2020   BP Readings from Last 3 Encounters:  06/29/20 118/76  05/08/20 122/78  03/09/20 118/76    Goals Addressed    . COMPLETED: [name] Patient Assistance       CARE PLAN ENTRY (see longitudinal plan of care for additional care plan information)  Current Barriers:  . Financial Barriers: patient has H[employer]Graw-Hilland reports copay for [name] XR is cost prohibitive at this time  Pharmacist Clinical Goal(s):  .Marland KitchenOver the next 30 days, patient will work with PharmD and providers to relieve medication access concerns  Interventions: . Comprehensive medication review completed; medication list updated in electronic medical record.  .[facility] team collaboration (see longitudinal plan of care) . [name] XR by [employer]: Patient meets income criteria for this medication's patient assistance program. Reviewed application process. Completed application was submitted to MDIRECTVpatient assistance program. Attestation form was mailed to patient by [employer]ong with original application.  .Catalina Gravellicense number for PCP added to original application once returned to office by patient. Mailed completed application along with completed  and signed attestation form to [employer] patient assistance program  Patient Self Care Activities:  . Obtained samples of [name] XR from the office today  Please see past updates related to this goal by clicking on the "Past Updates" button in the selected goal      . COMPLETED: Pharmacy Care Plan       CARE PLAN ENTRY  Current Barriers:  . Chronic Disease  Management support, education, and care coordination needs related to Hypertension, Hyperlipidemia, Diabetes, and Chronic Kidney Disease   Hypertension . Pharmacist Clinical Goal(s): o Over the next 180 days, patient will work with PharmD and providers to maintain BP goal <130/80 . Current regimen:  o Olmesartan/HCTZ 20/12.69m daily . Interventions: o Recommend patient check blood pressure periodically and if symptomatic and record for provider o Provided dietary and exercise recommendations . Patient self care activities - Over the next 90 days, patient will: o Check BP periodically and if symptomatic, document, and provide at future appointments o Ensure daily salt intake < 2300 mg/day o Try to exercise for 30 minutes daily 5 times per week  Hyperlipidemia . Pharmacist Clinical Goal(s): o Over the next 180 days, patient will work with PharmD and providers to maintain LDL goal < 70 . Current regimen:  o Rosuvastatin 156mdaily, 6 days per week . Interventions: o Discussed increase in healthy fats in diet and increase in exercise to help improve HDL (good cholesterol) o Provided dietary and exercise recommendations . Patient self care activities - Over the next 90 days, patient will: o Take medications daily as directed o Increase dietary intake of healthy fats (avocados, nuts, flaxseed, etc) o Try to exercise for 30 minutes daily 5 times per week  Diabetes . Pharmacist Clinical Goal(s): o Over the next 90 days, patient will work with PharmD and providers to maintain A1c goal <7% . Current regimen:  o [name] XR 50-100036mwice daily . Interventions: o Patient has received [name] XR through the mail from [employer]ient assistance program. Continue to assist as needed o Provided dietary and exercise recommendations Patient self care activities - Over the next 90 days, patient will: o Check blood sugar at bedtime, document, and provide at future appointments o Contact provider  with any episodes of hypoglycemia o Focus on eating well-balanced meals  Medication management . Pharmacist Clinical Goal(s): o Over the next 90 days, patient will work with PharmD and providers to achieve optimal medication adherence . [facility] o Comprehensive medication review performed. o Continue current medication management strategy o Provided information regarding medication cost and mail order option o Coordinate refills for fluticasone cream and levocetirizine with PCP staff . Patient self care activities - Over the next 90 days, patient will: o Take medications as prescribed o Report any questions or concerns to PharmD and/or provider(s)  Please see past updates related to this goal by clicking on the "Past Updates" button in the selected goal         Patient Care Plan: Wellness (Adult)    Problem Identified: Medication Adherence (Wellness)   Priority: High    Long-Range Goal: Medication Adherence Maintained   Start Date: 07/14/2020  Expected End Date: 10/12/2020  This Visit's Progress: On track  Priority: High  Note:   Current Barriers:   Ineffective Self Health Maintenance  Financial restraints for cost of [name]  Currently UNABLE TO independently self manage needs related to chronic health conditions.   Knowledge Deficits related to short term plan for  care coordination needs and long term plans for chronic disease management needs Nurse Case Manager Clinical Goal(s):   Over the next 90 days, patient will work with care management team to address care coordination and chronic disease management needs related to Disease Management  Educational Needs  Care Coordination  Medication Management and Education  Medication Reconciliation  Medication Assistance    Interventions:   Reviewed all medications to determine if patient or caregiver knows why the medications are given and if taken as prescribed.    Completed and reviewed a medication adherence assessment including barriers to medication adherence.   Arranged and encouraged counseling and medication review by pharmacist.   Assess barriers to medication adherence.   Determined patient is working with the embedded Pharm D for PAP assistance for [name]  Determined patient will obtain samples from the PCP office until he received his mail order supply of [place] with embedded Pharm D regarding patient's need for samples of [name] and recent attempt to obtain from the PCP office   Determined [name] CMA, pharm assistance is coordinating sample pickup from the PCP office with patient  Patient Goals/Self Care Activities Over the next 90 days, patient will:  - work with embedded Pharm D for PAP assistance for [name] - obtain samples from PCP when supply is low if needed  - refill/obtain samples when supply is getting low but before running out of medication - take medication exactly as prescribed by PCP         Follow Up Plan: Telephone follow up appointment with care management team member scheduled for: 09/08/20    Patient Care Plan: Diabetes Type 2 (Adult)    Problem Identified: Disease Progression (Diabetes, Type 2)   Priority: High    Long-Range Goal: Disease Progression Prevented or Minimized   Start Date: 07/14/2020  Expected End Date: 10/12/2020  This Visit's Progress: On track  Priority: High  Note:   Objective:  Lab Results  Component Value Date   HGBA1C 7.0 (H) 06/29/2020 .   Lab Results  Component Value Date   CREATININE 1.21 06/29/2020   CREATININE 1.22 03/09/2020   CREATININE 1.30 (H) 11/07/2019 .   [name] No results found for: EGFR Current Barriers:  [name] Knowledge Deficits related to basic Diabetes pathophysiology and self care/management . Knowledge Deficits related to medications used for management of diabetes . Difficulty obtaining or cannot afford medications Case Manager Clinical  Goal(s):  [name] Over the next 90 days, patient will demonstrate improved adherence to prescribed treatment plan for diabetes self care/management as evidenced by:  . daily monitoring and recording of CBG  . adherence to ADA/ carb modified diet . exercise 3-5 days/week . adherence to prescribed medication regimen Interventions:  . Provided education to patient about basic DM disease process . Reviewed medications with patient and discussed importance of medication adherence . Discussed plans with patient for ongoing care management follow up and provided patient with direct contact information for care management team . Advised patient, providing education and rationale, to check cbg before meals and at bedtime, before and after exercise, when feeling BS is too high or too low and record, calling PCP for findings outside established parameters.   . Review of patient status, including review of consultants reports, relevant laboratory and other test results, and medications completed. . Encourage lifestyle changes, such as increased intake of plant-based foods, stress reduction, consistent physical activity and smoking cessation to prevent long-term complications and chronic disease.  Patient Goals/Self-Care Activities .  Over the next 90 days, patient will:  - check blood sugar at prescribed times - check blood sugar before and after exercise - check blood sugar if I feel it is too high or too low - take the blood sugar log to all doctor visits - take the blood sugar meter to all doctor visits Follow Up Plan: Telephone follow up appointment with care management team member scheduled for: 09/08/20      Plan:   Telephone follow up appointment with care management team member scheduled for: 09/08/20  Barb Merino, RN, BSN, CCM Care Management Coordinator [facility]  Direct Phone: [phone]

## 2020-08-03 ENCOUNTER — Telehealth: Payer: Self-pay

## 2020-08-03 NOTE — Chronic Care Management (AMB) (Signed)
° ° °  Chronic Care Management Pharmacy Assistant   Name: Andres [name]  MRN: [id number] DOB: 05-02-1951  Reason for Encounter: Medication Review    PCP : Glendale Chard, MD  Allergies:  No Known Allergies  Medications: Outpatient Encounter Medications as of 08/03/2020  Medication Sig   Accu-Chek FastClix Lancets MISC One tab po qd dx; e11.65   ACCU-CHEK GUIDE test strip Use to check BS qd dx: E11.65   Arginine 500 MG CAPS Take by mouth. Daily   aspirin 81 MG tablet Take 81 mg by mouth daily.   cholecalciferol (VITAMIN D) 1000 UNITS tablet Take 4,000 Units by mouth daily.   Cyanocobalamin (VITAMIN B 12 PO) Take 1 capsule by mouth. 2041mcg daily   fluticasone (CUTIVATE) 0.05 % cream Apply topically 2 (two) times daily.   levocetirizine (XYZAL) 5 MG tablet Take 1 tablet (5 mg total) by mouth every evening.   Multiple Vitamin (MULTIVITAMIN) tablet Take 1 tablet by mouth daily.   olmesartan-hydrochlorothiazide (BENICAR HCT) 20-12.5 MG tablet TAKE 1 TABLET BY MOUTH ONCE DAILY.   rosuvastatin (CRESTOR) 10 MG tablet TAKE (1) TABLET BY MOUTH DAILY.   SitaGLIPtin-MetFORMIN HCl ([name] XR) 50-1000 MG TB24 Take 1 tablet by mouth 2 (two) times daily.   tadalafil (CIALIS) 5 MG tablet TAKE 1 TABLET BY MOUTH ONCE A DAY.   No facility-administered encounter medications on file as of 08/03/2020.    Current Diagnosis: Patient Active Problem List   Diagnosis Date Noted   Type 2 diabetes mellitus with stage 2 chronic kidney disease, with long-term current use of insulin ([place]) 07/23/2018   Pure hypercholesterolemia 07/23/2018   HTN (hypertension) 07/23/2018   Nephropathy associated with another disease 02/13/2018      Follow-Up:  Pharmacist Review  - Reviewed chart and adherence measures. Per Insurance data medication adherence for cholesterol is 80-89 % med compliance. Medication adherence for hypertension is 90-99 % med compliance.   Vallie Pearson,CPP Notified  Judithann Sheen, [facility] Clinical Pharmacist Assistant [phone]

## 2020-09-08 ENCOUNTER — Telehealth: Payer: Medicare HMO

## 2020-09-09 ENCOUNTER — Other Ambulatory Visit: Payer: Self-pay

## 2020-09-09 MED ORDER — JANUMET XR 50-1000 MG PO TB24
1.0000 | ORAL_TABLET | Freq: Two times a day (BID) | ORAL | 1 refills | Status: DC
Start: 2020-09-09 — End: 2020-09-11

## 2020-09-10 ENCOUNTER — Telehealth: Payer: Self-pay

## 2020-09-10 NOTE — Chronic Care Management (AMB) (Incomplete)
    Chronic Care Management Pharmacy Assistant   Name: ZIA KANNER  MRN: [id number] DOB: December 16, 1950  Reason for Encounter: Diabetes Adherence Call  Patient Questions:  1.  Have you seen any other providers since your last visit? {CHL THN UPSTREAM YES/NO:24149}  2.  Any changes in your medicines or health? {CHL THN UPSTREAM YES/NO:24149}   PCP : Glendale Chard, MD  Allergies:  No Known Allergies  Medications: Outpatient Encounter Medications as of 09/10/2020  Medication Sig  . Accu-Chek FastClix Lancets MISC One tab po qd dx; e11.65  . ACCU-CHEK GUIDE test strip Use to check BS qd dx: E11.65  . Arginine 500 MG CAPS Take by mouth. Daily  . aspirin 81 MG tablet Take 81 mg by mouth daily.  . cholecalciferol (VITAMIN D) 1000 UNITS tablet Take 4,000 Units by mouth daily.  . Cyanocobalamin (VITAMIN B 12 PO) Take 1 capsule by mouth. 201mcg daily  . fluticasone (CUTIVATE) 0.05 % cream Apply topically 2 (two) times daily.  [name] levocetirizine (XYZAL) 5 MG tablet Take 1 tablet (5 mg total) by mouth every evening.  . Multiple Vitamin (MULTIVITAMIN) tablet Take 1 tablet by mouth daily.  [name] olmesartan-hydrochlorothiazide (BENICAR HCT) 20-12.5 MG tablet TAKE 1 TABLET BY MOUTH ONCE DAILY.  . rosuvastatin (CRESTOR) 10 MG tablet TAKE (1) TABLET BY MOUTH DAILY.  [name] SitaGLIPtin-MetFORMIN HCl ([name] XR) 50-1000 MG TB24 Take 1 tablet by mouth 2 (two) times daily.  . tadalafil (CIALIS) 5 MG tablet TAKE 1 TABLET BY MOUTH ONCE A DAY.   No facility-administered encounter medications on file as of 09/10/2020.    Current Diagnosis: Patient Active Problem List   Diagnosis Date Noted  . Type 2 diabetes mellitus with stage 2 chronic kidney disease, with long-term current use of insulin ([place]) 07/23/2018  . Pure hypercholesterolemia 07/23/2018  . HTN (hypertension) 07/23/2018  . Nephropathy associated with another disease 02/13/2018   Recent Relevant Labs: Lab Results  Component Value Date/Time   HGBA1C  7.0 (H) 06/29/2020 03:13 PM   HGBA1C 6.8 (H) 03/09/2020 11:54 AM   HGBA1C 7.7 02/13/2018 12:00 AM   MICROALBUR 30 11/07/2019 11:30 AM   MICROALBUR 10 10/30/2018 10:29 AM    Kidney Function Lab Results  Component Value Date/Time   CREATININE 1.21 06/29/2020 03:13 PM   CREATININE 1.22 03/09/2020 11:54 AM   GFRNONAA 62 06/29/2020 03:13 PM   GFRAA 71 06/29/2020 03:13 PM    . Current antihyperglycemic regimen:  o *** . What recent interventions/DTPs have been made to improve glycemic control:  o *** . Have there been any recent hospitalizations or ED visits since last visit with CPP? {yes/no:20286} . Patient {reports/denies:24182} hypoglycemic symptoms, including {Hypoglycemic Symptoms:3049003} . Patient {reports/denies:24182} hyperglycemic symptoms, including {symptoms; hyperglycemia:17903} . How often are you checking your blood sugar? {BG Testing frequency:23922} . What are your blood sugars ranging?  o Fasting: *** o Before meals: *** o After meals: *** o Bedtime: *** . During the week, how often does your blood glucose drop below 70? {LowBGfrequency:24142} . Are you checking your feet daily/regularly?   Adherence Review: Is the patient currently on a STATIN medication? {yes/no:20286} Is the patient currently on ACE/ARB medication? {yes/no:20286} Does the patient have >5 day gap between last estimated fill dates? {yes/no:20286}   Candice L Thomas Goals Addressed   None     Follow-Up:  {Upstream CPA Follow-up:24147}

## 2020-09-10 NOTE — Chronic Care Management (AMB) (Addendum)
Chronic Care Management Pharmacy Assistant   Name: Andres [name]  MRN: [id number] DOB: [birth year]  Reason for Encounter: Diabetes Adherence Call-Patient Assistance Coordination   Patient Questions:  1.  Have you seen any other providers since your last visit? Yes, 07/14/20-Little, Claudette Stapler, RN (CCM). 06/29/21-[name], Bailey Mech, MD (OV). 06/25/20-Cotter, Elta Gu[place] (OV).      2.  Any changes in your medicines or health? No   PCP : Glendale Chard, MD  Allergies:  No Known Allergies  Medications: Outpatient Encounter Medications as of 09/10/2020  Medication Sig   Accu-Chek FastClix Lancets MISC One tab po qd dx; e11.65   ACCU-CHEK GUIDE test strip Use to check BS qd dx: E11.65   Arginine 500 MG CAPS Take by mouth. Daily   aspirin 81 MG tablet Take 81 mg by mouth daily.   cholecalciferol (VITAMIN D) 1000 UNITS tablet Take 4,000 Units by mouth daily.   Cyanocobalamin (VITAMIN B 12 PO) Take 1 capsule by mouth. 2020mcg daily   fluticasone (CUTIVATE) 0.05 % cream Apply topically 2 (two) times daily.   levocetirizine (XYZAL) 5 MG tablet Take 1 tablet (5 mg total) by mouth every evening.   Multiple Vitamin (MULTIVITAMIN) tablet Take 1 tablet by mouth daily.   olmesartan-hydrochlorothiazide (BENICAR HCT) 20-12.5 MG tablet TAKE 1 TABLET BY MOUTH ONCE DAILY.   rosuvastatin (CRESTOR) 10 MG tablet TAKE (1) TABLET BY MOUTH DAILY.   SitaGLIPtin-MetFORMIN HCl ([name] XR) 50-1000 MG TB24 Take 1 tablet by mouth 2 (two) times daily.   tadalafil (CIALIS) 5 MG tablet TAKE 1 TABLET BY MOUTH ONCE A DAY.   No facility-administered encounter medications on file as of 09/10/2020.    Current Diagnosis: Patient Active Problem List   Diagnosis Date Noted   Type 2 diabetes mellitus with stage 2 chronic kidney disease, with long-term current use of insulin ([place]) 07/23/2018   Pure hypercholesterolemia 07/23/2018   HTN (hypertension) 07/23/2018   Nephropathy associated with another disease 02/13/2018   Recent  Relevant Labs: Lab Results  Component Value Date/Time   HGBA1C 7.0 (H) 06/29/2020 03:13 PM   HGBA1C 6.8 (H) 03/09/2020 11:54 AM   HGBA1C 7.7 02/13/2018 12:00 AM   MICROALBUR 30 11/07/2019 11:30 AM   MICROALBUR 10 10/30/2018 10:29 AM    Kidney Function Lab Results  Component Value Date/Time   CREATININE 1.21 06/29/2020 03:13 PM   CREATININE 1.22 03/09/2020 11:54 AM   GFRNONAA 62 06/29/2020 03:13 PM   GFRAA 71 06/29/2020 03:13 PM    Current antihyperglycemic regimen:  [name] XR 50-1000mg  twice daily  What recent interventions/DTPs have been made to improve glycemic control:  Patient reports to take medications as directed. Patient stated he has been without his [name] XR for a few days due to not knowing to call [employer] for refills and unable to obtain samples from PCP's office.   Have there been any recent hospitalizations or ED visits since last visit with CPP? No   Patient denies hypoglycemic symptoms, including Pale, Sweaty, Shaky, Hungry, Nervous/irritable and Vision changes   Patient denies hyperglycemic symptoms, including blurry vision, excessive thirst, fatigue, polyuria and weakness   How often are you checking your blood sugar? at bedtime   What are your blood sugars ranging? Patient reports his lows are at or around 60 and his highest reading was at 130 but typically its at 120 or lower. Fasting: none Before meals: none After meals: none Bedtime: none  During the week, how often does your blood glucose drop below 70? Never  Are you checking your feet daily/regularly? Patient states he is checking his feet daily with no findings or issues to report.  Adherence Review: Is the patient currently on a STATIN medication? Yes Is the patient currently on ACE/ARB medication? No Does the patient have >5 day gap between last estimated fill dates? Yes   09/10/20-Patient reports he is out of [name] XR. Patient stated he only received 1 bottle of 60 tablets for 30 Days on  08/03/20 with only 3 refills left. The patient expressed he was under the impression he should receive enough for the calendar year. I informed the patient I will reach out to [employer] and update [name], CPP for clarification on quantity amount and submit a refill request. The patient voiced understanding.   Patient reports he will need a refill of levocetirizine 5 MG as well.   The patient voiced he has stiffness in hand but already spoke to Dr.[name] about. Per patient; PCP recommended him to take ginger root tea, take as directed on the box. Patient states he went to a natural food store and purchased the tea about two weeks ago.  Called and spoke with [employer] Patient Assistance Program representative to request an expedited refill of [name] XR and verify day supply order. Per [employer], patient is overdue for [name] XR for #60 tablets 30 DS and asked if I will like to have this refilled for 30DS or 90DS as it shows in their system. I requested 90DS for the patient. Per [employer]; Shipment will be sent out same day 09/10/20 and patient should receive on 09/11/20.  Called the patient to confirm I did speak with [employer] and delivery information given by [employer]. The patient voiced understanding and gave many thanks for assisting him in this matter.  09/11/20- Per [name], CPP; would like patient to schedule a telehealth visit with her this month 09/2020. I called the patient to identify his availability, patient confirmed 09/29/20 at 9:00 AM will be great. Told the patient I will have Stacy add him to [name], CPP schedule for that date and time. The patient voiced understanding.  Staff Message sent to [name].  Follow-Up:  Patient Assistance Coordination and Pharmacist Review  [name], CPP Notified.  Raynelle Highland, [place] Pharmacist Assistant [phone] CCM Total Time: 91 minutes (call to [employer]: 32 minutes) I have reviewed the care  management and care coordination activities outlined in this encounter and I am certifying that I agree with the content of this note. No further action required.  Total Time: 2 minutes   Mayford Knife, [facility] 09/16/20 1:55 PM

## 2020-09-11 ENCOUNTER — Other Ambulatory Visit: Payer: Self-pay

## 2020-09-11 ENCOUNTER — Other Ambulatory Visit: Payer: Self-pay | Admitting: Internal Medicine

## 2020-09-11 MED ORDER — LEVOCETIRIZINE DIHYDROCHLORIDE 5 MG PO TABS: 5.0000 mg | ORAL_TABLET | Freq: Every evening | ORAL | 2 refills | Status: DC

## 2020-09-16 ENCOUNTER — Telehealth: Payer: Medicare HMO

## 2020-09-29 ENCOUNTER — Ambulatory Visit
Admission: EM | Admit: 2020-09-29 | Discharge: 2020-09-29 | Disposition: A | Discharge status: Home / Self Care | Payer: Medicare HMO | Attending: Physician Assistant | Admitting: Physician Assistant

## 2020-09-29 ENCOUNTER — Ambulatory Visit (INDEPENDENT_AMBULATORY_CARE_PROVIDER_SITE_OTHER): Payer: Medicare HMO

## 2020-09-29 ENCOUNTER — Ambulatory Visit: Payer: No Typology Code available for payment source

## 2020-09-29 ENCOUNTER — Other Ambulatory Visit: Payer: Self-pay

## 2020-09-29 DIAGNOSIS — Z794 Long term (current) use of insulin: Secondary | ICD-10-CM

## 2020-09-29 DIAGNOSIS — E1122 Type 2 diabetes mellitus with diabetic chronic kidney disease: Secondary | ICD-10-CM | POA: Diagnosis not present

## 2020-09-29 DIAGNOSIS — S92525A Nondisplaced fracture of medial phalanx of left lesser toe(s), initial encounter for closed fracture: Secondary | ICD-10-CM | POA: Diagnosis not present

## 2020-09-29 DIAGNOSIS — I129 Hypertensive chronic kidney disease with stage 1 through stage 4 chronic kidney disease, or unspecified chronic kidney disease: Secondary | ICD-10-CM | POA: Diagnosis not present

## 2020-09-29 DIAGNOSIS — S92514A Nondisplaced fracture of proximal phalanx of right lesser toe(s), initial encounter for closed fracture: Secondary | ICD-10-CM | POA: Diagnosis not present

## 2020-09-29 DIAGNOSIS — S92524A Nondisplaced fracture of medial phalanx of right lesser toe(s), initial encounter for closed fracture: Secondary | ICD-10-CM | POA: Diagnosis not present

## 2020-09-29 DIAGNOSIS — E7800 Pure hypercholesterolemia, unspecified: Secondary | ICD-10-CM

## 2020-09-29 DIAGNOSIS — S99921A Unspecified injury of right foot, initial encounter: Secondary | ICD-10-CM | POA: Diagnosis not present

## 2020-09-29 DIAGNOSIS — N182 Chronic kidney disease, stage 2 (mild): Secondary | ICD-10-CM | POA: Diagnosis not present

## 2020-09-29 NOTE — ED Triage Notes (Signed)
Pt presents with right toe injury that happened last week

## 2020-09-29 NOTE — Discharge Instructions (Signed)
Return if any problems.

## 2020-09-29 NOTE — Progress Notes (Signed)
Chronic Care Management Pharmacy Note  10/06/2020 Name:  Andres [name] MRN:  [id number] DOB:  Jan 26, 1951  Subjective: Andres [name] is an 70 y.o. year old male who is a primary patient of Glendale Chard, MD.  The CCM team was consulted for assistance with disease management and care coordination needs.    Engaged with patient by telephone for follow up visit in response to provider referral for pharmacy case management and/or care coordination services.   Consent to Services:  The patient was given information about Chronic Care Management services, agreed to services, and gave verbal consent prior to initiation of services.  Please see initial visit note for detailed documentation.   Patient Care Team: Glendale Chard, MD as PCP - General (Internal Medicine) Harl Bowie Alphonse Guild, MD as PCP - Cardiology (Cardiology) Rex Kras Claudette Stapler, RN as Case Manager Mayford Knife, [facility] (Pharmacist)  Recent office visits: 06/29/2020 PCP OV  Recent consult visits: 06/25/2020 [facility] visits: None in previous 6 months  Objective:  Lab Results  Component Value Date   CREATININE 1.21 06/29/2020   BUN 11 06/29/2020   GFRNONAA 62 06/29/2020   GFRAA 71 06/29/2020   NA 139 06/29/2020   K 4.5 06/29/2020   CALCIUM 9.5 06/29/2020   CO2 24 06/29/2020   GLUCOSE 95 06/29/2020    Lab Results  Component Value Date/Time   HGBA1C 7.0 (H) 06/29/2020 03:13 PM   HGBA1C 6.8 (H) 03/09/2020 11:54 AM   HGBA1C 7.7 02/13/2018 12:00 AM   MICROALBUR 30 11/07/2019 11:30 AM   MICROALBUR 10 10/30/2018 10:29 AM    Last diabetic Eye exam:  Lab Results  Component Value Date/Time   HMDIABEYEEXA No Retinopathy 06/25/2020 12:00 AM    Last diabetic Foot exam: No results found for: HMDIABFOOTEX   Lab Results  Component Value Date   CHOL 82 (L) 11/07/2019   HDL 30 (L) 11/07/2019   LDLCALC 32 11/07/2019   TRIG 109 11/07/2019   CHOLHDL 2.7 11/07/2019    Hepatic Function Latest Ref Rng  & Units 06/29/2020 11/07/2019 03/05/2019  Total Protein 6.0 - 8.5 g/dL 7.8 8.0 7.5  Albumin 3.8 - 4.8 g/dL 4.6 4.5 4.6  AST 0 - 40 IU/L 20 24 13   ALT 0 - 44 IU/L 29 21 13   Alk Phosphatase 44 - 121 IU/L 64 58 52  Total Bilirubin 0.0 - 1.2 mg/dL 0.3 0.5 0.3  Bilirubin, Direct 0.00 - 0.40 mg/dL - - -    Lab Results  Component Value Date/Time   TSH 2.410 06/29/2020 03:13 PM    CBC Latest Ref Rng & Units 11/07/2019 10/30/2018 06/16/2008  WBC 3.4 - 10.8 x10E3/uL 8.2 7.4 -  Hemoglobin 13.0 - 17.7 g/dL 13.7 13.6 13.9  Hematocrit 37.5 - 51.0 % 41.1 40.0 41.0  Platelets 150 - 450 x10E3/uL 222 199 -    No results found for: VD25OH  Clinical ASCVD: No  The ASCVD Risk score Mikey Bussing DC Jr., et al., 2013) failed to calculate for the following reasons:   The valid total cholesterol range is 130 to 320 mg/dL    Depression screen [facility] 2/9 11/07/2019 10/30/2018 07/23/2018  Decreased Interest 0 0 0  Down, Depressed, Hopeless 0 0 0  PHQ - 2 Score 0 0 0  Altered sleeping 0 - -  Tired, decreased energy 0 - -  Change in appetite 0 - -  Feeling bad or failure about yourself  0 - -  Trouble concentrating 0 - -  Moving slowly or fidgety/restless 0 - -  Suicidal thoughts 0 - -  PHQ-9 Score 0 - -  Difficult doing work/chores Not difficult at all - -      Social History   Tobacco Use  Smoking Status Former Smoker  . Packs/day: 0.50  . Years: 22.00  . Pack years: 11.00  . Types: Cigarettes  . Quit date: 07/11/2011  . Years since quitting: 9.2  Smokeless Tobacco Never Used  Tobacco Comment   he has quit   BP Readings from Last 3 Encounters:  06/29/20 118/76  05/08/20 122/78  03/09/20 118/76   Pulse Readings from Last 3 Encounters:  06/29/20 (!) 105  05/08/20 80  03/09/20 86   Wt Readings from Last 3 Encounters:  06/29/20 234 lb 6.4 oz (106.3 kg)  03/09/20 234 lb (106.1 kg)  11/07/19 236 lb 9.6 oz (107.3 kg)   BMI Readings from Last 3 Encounters:  06/29/20 32.69 kg/m  05/08/20 32.64  kg/m  03/09/20 32.64 kg/m    Assessment/Interventions: Review of patient past medical history, allergies, medications, health status, including review of consultants reports, laboratory and other test data, was performed as part of comprehensive evaluation and provision of chronic care management services.   SDOH:  (Social Determinants of Health) assessments and interventions performed: Yes SDOH Interventions   Flowsheet Row Most Recent Value  SDOH Interventions   Financial Strain Interventions Other (Comment)  [Pharmacy team collaboration with patient to complete patient assistance.]      CCM Care Plan  No Known Allergies  Medications Reviewed Today    Reviewed by Azalia Bilis, RN (Registered Nurse) on 09/29/20 at 26  Med List Status: <None>  Medication Order Taking? Sig Documenting Provider Last Dose Status Informant  Accu-Chek FastClix Lancets MISC [id number]  One tab po qd dx; e11.65 Glendale Chard, MD  Active   ACCU-CHEK GUIDE test strip [id number]  Use to check BS qd dx: E11.65 Glendale Chard, MD  Active   Arginine 500 MG CAPS [id number]  Take by mouth. Daily [provider]  Active Self  aspirin 81 MG tablet [id number]  Take 81 mg by mouth daily. [provider]  Active   cholecalciferol (VITAMIN D) 1000 UNITS tablet [id number]  Take 4,000 Units by mouth daily. [provider]  Active   Cyanocobalamin (VITAMIN B 12 PO) [id number]  Take 1 capsule by mouth. 2038mg daily [provider]  Active Self  fluticasone (CUTIVATE) 0.05 % cream [id number] Apply topically 2 (two) times daily. SGlendale Chard MD  Active   [name] XR 50-1000 MG TB24 [id number] TAKE ONE TABLET BY MOUTH TWICE DAILY SGlendale Chard MD  Active   levocetirizine (XYZAL) 5 MG tablet [id number] Take 1 tablet (5 mg total) by mouth every evening. SGlendale Chard MD  Active   Multiple Vitamin (MULTIVITAMIN) tablet [id number] Take 1 tablet by mouth daily. [provider]  Active    olmesartan-hydrochlorothiazide (BENICAR HCT) 20-12.5 MG tablet [id number] TAKE 1 TABLET BY MOUTH ONCE DAILY. SGlendale Chard MD  Active   rosuvastatin (CRESTOR) 10 MG tablet [id number] TAKE (1) TABLET BY MOUTH DAILY. SGlendale Chard MD  Active   tadalafil (CIALIS) 5 MG tablet [id number] TAKE 1 TABLET BY MOUTH ONCE A DAY. SGlendale Chard MD  Active           Patient Active Problem List   Diagnosis Date Noted  . Type 2 diabetes mellitus with stage 2 chronic kidney disease, with long-term current use of insulin (  [place]) 07/23/2018  . Pure hypercholesterolemia 07/23/2018  . HTN (hypertension) 07/23/2018  . Nephropathy associated with another disease 02/13/2018    Immunization History  Administered Date(s) Administered  . Fluad Quad(high Dose 65+) 05/08/2020  . Influenza, High Dose Seasonal PF 04/11/2018, 04/01/2019  . Moderna SARS-COV2 Booster Vaccination 05/22/2020  . Moderna Sars-Covid-2 Vaccination 08/17/2019, 09/17/2019  . Pneumococcal Polysaccharide-23 07/23/2018  . Tdap 04/17/2019    Conditions to be addressed/monitored:  Hypertension, Hyperlipidemia and Diabetes  Care Plan : [employer]  Updates made by Mayford Knife, RPH since 10/06/2020 12:00 AM    Problem: HTN, DM II, HLD   Priority: High  Onset Date: 09/29/2020    Long-Range Goal: Disease State Management   Start Date: 09/29/2020  This Visit's Progress: On track  Priority: High  Note:    Current Barriers:  . Unable to independently afford treatment regimen . Does not maintain contact with provider office . Does not contact provider office for questions/concerns   Pharmacist Clinical Goal(s):  [name] Patient will verbalize ability to afford treatment regimen . maintain control of diabetes  as evidenced by A1c.  through collaboration with PharmD and provider.    Interventions: . 1:1 collaboration with Glendale Chard, MD regarding development and update of comprehensive plan of care as evidenced by  provider attestation and co-signature . Inter-disciplinary care team collaboration (see longitudinal plan of care) . Comprehensive medication review performed; medication list updated in electronic medical record  Hypertension (BP goal <130/80) -Controlled -Current treatment: . Olmesartan - Hydrochlorothiazide - take 1 tablet by mouth once daily.  o Patient has 30 tablets left requesting refills [facility]) -Current home readings: checking at the pharmacy when he picks up his medication  -Current dietary habits: patient is eating mostly baked food and has decreased the amount of salt that he has in his diet  -Current exercise habits: has not started exercising yet.  -Denies hypotensive/hypertensive symptoms -Educated on Importance of home blood pressure monitoring; Proper BP monitoring technique; -Counseled to monitor BP at home 5 times per week, document, and provide log at future appointments. -Patient has purchased a BP cuff that will record the readings and it should be at his home shortly.  -Counseled on diet and exercise extensively Recommended to continue current medication Collaborated with PCP team to send in a prescription for his 90 day suppply of Olmesartan- Hydrochlorothiazide.   Hyperlipidemia: (LDL goal < 70) -Controlled -Current treatment: . Crestor 10 mg - taking 1 tablet by mouth five days per week. o Patient has 12  Tablets left  [facility]) -Current dietary patterns: lowering the amount of fried, fatty foods -Current exercise habits: patient has senior life exercise program through [employer] at this time he is concerned about the pandemic.  -He would like to lose 20 pounds  -Educated on Cholesterol goals;  Benefits of statin for ASCVD risk reduction; Importance of limiting foods high in cholesterol; -Counseled on diet and exercise extensively Recommended to continue current medication Collaborated with PCP team for a refill of Crestor 10  mg tablet to be sent to [place] with the new directions.   Diabetes (A1c goal <7%) -Uncontrolled -Current medications: . [name] XR 50 mg -1000 mg tablet twice daily  o Patient reports that he missed some days of medication when he went to [place], [place] to visit his grandson - Patient receiving through patient assistance  -Current home glucose readings - patient reports that he checked his  . post prandial glucose: 112- 160 -  Denies hypoglycemic/hyperglycemic symptoms -Current meal patterns:  . Breakfast: varies - sometimes cereal, or egg with sausage   . lunch: is not eating as much at lunch  . dinner: baked grouper, with vegetables and maybe a sweet potato, sometimes fries  . snacks:  [name] Drinks:patient is drinking plenty of water.  -Current exercise: patient reports that he was going to start exercising again but he has not started the walking as much, he reports that he has not started walking yet.  -Patient is going to discuss with insurance the cost of CGM and the benefits. -Educated on A1c and blood sugar goals; Complications of diabetes including kidney damage, retinal damage, and cardiovascular disease; Exercise goal of 150 minutes per week; Benefits of weight loss; Patient reports that he is very grateful for Candice helping him with the Patient Assistance process that was completed by the Pharmacy team. He now has a full 90 day supply of medication and is approved for the year.  -Counseled to check feet daily and get yearly eye exams -Counseled on diet and exercise extensively Recommended to continue current medication Counseled on the importance of contact PCP team with any concerns about his health. Patient reports that he is going to schedule a visit becuase he hurt his toe. I followed up with [name] patient to have visit at Urgent Care for imaging studies. I helped the patient find the local [facility], and gave him the phone number.  -Patient  reported they will be going to the Urgent Care sooner then later, also let [name] know that patient will be going to patient care.  Collaborated with PCP team to request refills of patients current glucose testing supplies.    Patient Goals/Self-Care Activities . Patient will:  - take medications as prescribed check glucose at least once per day, document, and provide at future appointments check blood pressure at least three times per week, document, and provide at future appointments target a minimum of 150 minutes of moderate intensity exercise weekly  Follow Up Plan: Telephone follow up appointment with care management team member scheduled for: The patient has been provided with contact information for the care management team and has been advised to call with any health related questions or concerns.  Patients Next Appointment on 12/02/2020.       Medication Assistance: [name] obtained through [employer]  medication assistance program.  Enrollment ends  06/2021.  Patient's preferred pharmacy is:  [facility], [place] [address] Phone: [phone] Fax: [phone]  [facility], [address] [place] [address] Phone: [phone] Fax: [phone]  Uses pill box? No - patient reports that he does have a pill bottle but does not use it all the time.  Pt endorses 80% compliance  We discussed: Benefits of medication synchronization, packaging and delivery as well as enhanced pharmacist oversight with [employer]. Patient decided to: Continue current medication management strategy  Care Plan and Follow Up Patient Decision:  Patient agrees to Care Plan and Follow-up.  Plan: Telephone follow up appointment with care management team member scheduled for:  12/02/2020  [name], PharmD Clinical Pharmacist [facility] [phone]

## 2020-10-01 NOTE — ED Provider Notes (Signed)
[facility]    CSN: [id number] Arrival date & time: 09/29/20  1221      History   Chief Complaint Chief Complaint  Patient presents with  . Toe Injury    HPI Andres [name] is a 70 y.o. male.   The history is provided by the patient. No language interpreter was used.  Toe Pain This is a new problem. The current episode started more than 1 week ago. The problem occurs constantly. The problem has been gradually improving. Nothing aggravates the symptoms. Nothing relieves the symptoms. Andres [name] has tried nothing for the symptoms.  Pt hit his toe.  Pt complains of swelling and pain  Past Medical History:  Diagnosis Date  . Diabetes ([place])   . High cholesterol   . Hypertension    states under control with med., has been on med. x 5 yr.  . Seasonal allergies   . Umbilical hernia 10/8248    Patient Active Problem List   Diagnosis Date Noted  . Type 2 diabetes mellitus with stage 2 chronic kidney disease, with long-term current use of insulin ([place]) 07/23/2018  . Pure hypercholesterolemia 07/23/2018  . HTN (hypertension) 07/23/2018  . Nephropathy associated with another disease 02/13/2018    Past Surgical History:  Procedure Laterality Date  . INSERTION OF MESH N/A 09/25/2014   Procedure: INSERTION OF MESH;  Surgeon: Donnie Mesa, MD;  Location: [place];  Service: General;  Laterality: N/A;  . ORIF PATELLA FRACTURE Right 06/16/2008  . TOTAL HIP ARTHROPLASTY Left 12/17/2007  . UMBILICAL HERNIA REPAIR N/A 09/25/2014   Procedure: UMBILICAL HERNIA REPAIR WITH MESH;  Surgeon: Donnie Mesa, MD;  Location: [facility];  Service: General;  Laterality: N/A;       Home Medications    Prior to Admission medications   Medication Sig Start Date End Date Taking? Authorizing Provider  Accu-Chek FastClix Lancets MISC One tab po qd dx; e11.65 07/01/19   Glendale Chard, MD  ACCU-CHEK GUIDE test strip Use to check BS qd dx: E11.65 07/01/19   Glendale Chard, MD  Arginine 500 MG CAPS Take by mouth. Daily    [provider]  aspirin 81 MG tablet Take 81 mg by mouth daily.    [provider]  cholecalciferol (VITAMIN D) 1000 UNITS tablet Take 4,000 Units by mouth daily.    [provider]  Cyanocobalamin (VITAMIN B 12 PO) Take 1 capsule by mouth. 2030mcg daily    [provider]  fluticasone (CUTIVATE) 0.05 % cream Apply topically 2 (two) times daily. 02/28/20   Glendale Chard, MD  [name] XR 50-1000 MG TB24 TAKE ONE TABLET BY MOUTH TWICE DAILY 09/11/20   Glendale Chard, MD  levocetirizine (XYZAL) 5 MG tablet Take 1 tablet (5 mg total) by mouth every evening. 09/11/20   Glendale Chard, MD  Multiple Vitamin (MULTIVITAMIN) tablet Take 1 tablet by mouth daily.    [provider]  olmesartan-hydrochlorothiazide (BENICAR HCT) 20-12.5 MG tablet TAKE 1 TABLET BY MOUTH ONCE DAILY. 01/20/20   Glendale Chard, MD  rosuvastatin (CRESTOR) 10 MG tablet TAKE (1) TABLET BY MOUTH DAILY. 06/19/20   Glendale Chard, MD  tadalafil (CIALIS) 5 MG tablet TAKE 1 TABLET BY MOUTH ONCE A DAY. 12/17/19   Glendale Chard, MD    Family History Family History  Problem Relation Age of Onset  . Healthy Mother   . Early death Father     Social History Social History   Tobacco Use  . Smoking  status: Former Smoker    Packs/day: 0.50    Years: 22.00    Pack years: 11.00    Types: Cigarettes    Quit date: 07/11/2011    Years since quitting: 9.2  . Smokeless tobacco: Never Used  . Tobacco comment: Andres [name] has quit  Vaping Use  . Vaping Use: Never used  Substance Use Topics  . Alcohol use: No  . Drug use: No     Allergies   Patient has no known allergies.   Review of Systems Review of Systems  All other systems reviewed and are negative.    Physical Exam Triage Vital Signs ED Triage Vitals [09/29/20 1420]  Enc Vitals Group     BP      Pulse      Resp      Temp      Temp src      SpO2      Weight      Height       Head Circumference      Peak Flow      Pain Score 2     Pain Loc      Pain Edu?      Excl. in [place]?    No data found.  Updated Vital Signs There were no vitals taken for this visit.  Visual Acuity Right Eye Distance:   Left Eye Distance:   Bilateral Distance:    Right Eye Near:   Left Eye Near:    Bilateral Near:     Physical Exam Vitals and nursing note reviewed.  Constitutional:      Appearance: Andres [name] is well-developed.  HENT:     Head: Normocephalic and atraumatic.  Eyes:     Conjunctiva/sclera: Conjunctivae normal.  Cardiovascular:     Rate and Rhythm: Normal rate.     Heart sounds: No murmur heard.   Pulmonary:     Effort: Pulmonary effort is normal. No respiratory distress.  Musculoskeletal:        General: Swelling and tenderness present.     Cervical back: Neck supple.     Comments: 5th toe swollena dn tender  Skin:    General: Skin is warm and dry.  Neurological:     General: No focal deficit present.     Mental Status: Andres [name] is alert.  Psychiatric:        Mood and Affect: Mood normal.      UC Treatments / Results  Labs (all labs ordered are listed, but only abnormal results are displayed) Labs Reviewed - No data to display  EKG   Radiology No results found.  Procedures Procedures (including critical care time)  Medications Ordered in UC Medications - No data to display  Initial Impression / Assessment and Plan / UC Course  I have reviewed the triage vital signs and the nursing notes.  Pertinent labs & imaging results that were available during my care of the patient were reviewed by me and considered in my medical decision making (see chart for details).     MDM:  Xray shows fracture of right 5th toe. Final Clinical Impressions(s) / UC Diagnoses   Final diagnoses:  Closed nondisplaced fracture of proximal phalanx of lesser toe of right foot, initial encounter     Discharge Instructions     Return if any problems.    ED  Prescriptions    None     PDMP not reviewed this encounter.  An After Visit Summary was printed and given to  the patient. An After Visit Summary was printed and given to the patient.   Fransico Meadow, [place] 10/01/20 1744

## 2020-10-06 NOTE — Patient Instructions (Signed)
Visit Information It was great speaking with you today!  Please let me know if you have any questions about our visit.  Goals Addressed            This Visit's Progress   . Perform Foot Care-Diabetes Type 2       Timeframe:  Long-Range Goal Priority:  High Start Date:   09/29/2020                           Expected End Date:                       Follow Up Date 12/02/2020    - check feet daily for cuts, sores or redness - keep feet up while sitting - trim toenails straight across - wash and dry feet carefully every day - wear comfortable, cotton socks - wear comfortable, well-fitting shoes    Why is this important?    Good foot care is very important when you have diabetes.   There are many things you can do to keep your feet healthy and catch a problem early.    Notes:  Patient to be seen at urgent care for toe injury and pain at [facility] in [place]        Patient Care Plan: Wellness (Adult)    Problem Identified: Medication Adherence (Wellness)   Priority: High    Long-Range Goal: Medication Adherence Maintained   Start Date: 07/14/2020  Expected End Date: 10/12/2020  This Visit's Progress: On track  Priority: High  Note:   Current Barriers:   Ineffective Self Health Maintenance  Financial restraints for cost of [name]  Currently UNABLE TO independently self manage needs related to chronic health conditions.   Knowledge Deficits related to short term plan for care coordination needs and long term plans for chronic disease management needs Nurse Case Manager Clinical Goal(s):   Over the next 90 days, patient will work with care management team to address care coordination and chronic disease management needs related to Disease Management  Educational Needs  Care Coordination  Medication Management and Education  Medication Reconciliation  Medication Assistance    Interventions:   Reviewed all medications to determine if patient or  caregiver knows why the medications are given and if taken as prescribed.   Completed and reviewed a medication adherence assessment including barriers to medication adherence.   Arranged and encouraged counseling and medication review by pharmacist.   Assess barriers to medication adherence.   Determined patient is working with the embedded Pharm D for PAP assistance for [name]  Determined patient will obtain samples from the PCP office until he received his mail order supply of [place] with embedded Pharm D regarding patient's need for samples of [name] and recent attempt to obtain from the PCP office   Determined [name] CMA, pharm assistance is coordinating sample pickup from the PCP office with patient  Patient Goals/Self Care Activities Over the next 90 days, patient will:  - work with embedded Pharm D for PAP assistance for [name] - obtain samples from PCP when supply is low if needed  - refill/obtain samples when supply is getting low but before running out of medication - take medication exactly as prescribed by PCP         Follow Up Plan: Telephone follow up appointment with care management team member scheduled for: 09/08/20    Patient Care Plan: Diabetes Type  2 (Adult)    Problem Identified: Disease Progression (Diabetes, Type 2)   Priority: High    Long-Range Goal: Disease Progression Prevented or Minimized   Start Date: 07/14/2020  Expected End Date: 10/12/2020  This Visit's Progress: On track  Priority: High  Note:   Objective:  Lab Results  Component Value Date   HGBA1C 7.0 (H) 06/29/2020 .   Lab Results  Component Value Date   CREATININE 1.21 06/29/2020   CREATININE 1.22 03/09/2020   CREATININE 1.30 (H) 11/07/2019 .   [name] No results found for: EGFR Current Barriers:  [name] Knowledge Deficits related to basic Diabetes pathophysiology and self care/management . Knowledge Deficits related to medications used for management of  diabetes . Difficulty obtaining or cannot afford medications Case Manager Clinical Goal(s):  [name] Over the next 90 days, patient will demonstrate improved adherence to prescribed treatment plan for diabetes self care/management as evidenced by:  . daily monitoring and recording of CBG  . adherence to ADA/ carb modified diet . exercise 3-5 days/week . adherence to prescribed medication regimen Interventions:  . Provided education to patient about basic DM disease process . Reviewed medications with patient and discussed importance of medication adherence . Discussed plans with patient for ongoing care management follow up and provided patient with direct contact information for care management team . Advised patient, providing education and rationale, to check cbg before meals and at bedtime, before and after exercise, when feeling BS is too high or too low and record, calling PCP for findings outside established parameters.   . Review of patient status, including review of consultants reports, relevant laboratory and other test results, and medications completed. . Encourage lifestyle changes, such as increased intake of plant-based foods, stress reduction, consistent physical activity and smoking cessation to prevent long-term complications and chronic disease.  Patient Goals/Self-Care Activities . Over the next 90 days, patient will:  - check blood sugar at prescribed times - check blood sugar before and after exercise - check blood sugar if I feel it is too high or too low - take the blood sugar log to all doctor visits - take the blood sugar meter to all doctor visits Follow Up Plan: Telephone follow up appointment with care management team member scheduled for: 09/08/20      Patient Care Plan: CCM Pharmacy Care Plan    Problem Identified: HTN, DM II, HLD   Priority: High  Onset Date: 09/29/2020    Long-Range Goal: Disease State Management   Start Date: 09/29/2020  This Visit's  Progress: On track  Priority: High  Note:    Current Barriers:  . Unable to independently afford treatment regimen . Does not maintain contact with provider office . Does not contact provider office for questions/concerns   Pharmacist Clinical Goal(s):  [name] Patient will verbalize ability to afford treatment regimen . maintain control of diabetes  as evidenced by A1c.  through collaboration with PharmD and provider.    Interventions: . 1:1 collaboration with Glendale Chard, MD regarding development and update of comprehensive plan of care as evidenced by provider attestation and co-signature . Inter-disciplinary care team collaboration (see longitudinal plan of care) . Comprehensive medication review performed; medication list updated in electronic medical record  Hypertension (BP goal <130/80) -Controlled -Current treatment: . Olmesartan - Hydrochlorothiazide - take 1 tablet by mouth once daily.  o Patient has 30 tablets left requesting refills [facility]) -Current home readings: checking at the pharmacy when he picks up his medication  -Current dietary  habits: patient is eating mostly baked food and has decreased the amount of salt that he has in his diet  -Current exercise habits: has not started exercising yet.  -Denies hypotensive/hypertensive symptoms -Educated on Importance of home blood pressure monitoring; Proper BP monitoring technique; -Counseled to monitor BP at home 5 times per week, document, and provide log at future appointments. -Patient has purchased a BP cuff that will record the readings and it should be at his home shortly.  -Counseled on diet and exercise extensively Recommended to continue current medication Collaborated with PCP team to send in a prescription for his 90 day suppply of Olmesartan- Hydrochlorothiazide.   Hyperlipidemia: (LDL goal < 70) -Controlled -Current treatment: . Crestor 10 mg - taking 1 tablet by mouth five days per  week. o Patient has 12  Tablets left  [facility]) -Current dietary patterns: lowering the amount of fried, fatty foods -Current exercise habits: patient has senior life exercise program through [employer] at this time he is concerned about the pandemic.  -He would like to lose 20 pounds  -Educated on Cholesterol goals;  Benefits of statin for ASCVD risk reduction; Importance of limiting foods high in cholesterol; -Counseled on diet and exercise extensively Recommended to continue current medication Collaborated with PCP team for a refill of Crestor 10 mg tablet to be sent to [place] with the new directions.   Diabetes (A1c goal <7%) -Uncontrolled -Current medications: . [name] XR 50 mg -1000 mg tablet twice daily  o Patient reports that he missed some days of medication when he went to [place], [place] to visit his grandson - Patient receiving through patient assistance  -Current home glucose readings - patient reports that he checked his  . post prandial glucose: 112- 160 -Denies hypoglycemic/hyperglycemic symptoms -Current meal patterns:  . Breakfast: varies - sometimes cereal, or egg with sausage   . lunch: is not eating as much at lunch  . dinner: baked grouper, with vegetables and maybe a sweet potato, sometimes fries  . snacks:  [name] Drinks:patient is drinking plenty of water.  -Current exercise: patient reports that he was going to start exercising again but he has not started the walking as much, he reports that he has not started walking yet.  -Patient is going to discuss with insurance the cost of CGM and the benefits. -Educated on A1c and blood sugar goals; Complications of diabetes including kidney damage, retinal damage, and cardiovascular disease; Exercise goal of 150 minutes per week; Benefits of weight loss; Patient reports that he is very grateful for Candice helping him with the Patient Assistance process that was completed by the Pharmacy team.  He now has a full 90 day supply of medication and is approved for the year.  -Counseled to check feet daily and get yearly eye exams -Counseled on diet and exercise extensively Recommended to continue current medication Counseled on the importance of contact PCP team with any concerns about his health. Patient reports that he is going to schedule a visit becuase he hurt his toe. I followed up with [name] patient to have visit at Urgent Care for imaging studies. I helped the patient find the local [facility], and gave him the phone number.  -Patient reported they will be going to the Urgent Care sooner then later, also let [name] know that patient will be going to patient care.  Collaborated with PCP team to request refills of patients current glucose testing supplies.    Patient Goals/Self-Care Activities .  Patient will:  - take medications as prescribed check glucose at least once per day, document, and provide at future appointments check blood pressure at least three times per week, document, and provide at future appointments target a minimum of 150 minutes of moderate intensity exercise weekly  Follow Up Plan: Telephone follow up appointment with care management team member scheduled for: The patient has been provided with contact information for the care management team and has been advised to call with any health related questions or concerns.  Patients Next Appointment on 12/02/2020.       Patient agreed to services and verbal consent obtained.   The patient verbalized understanding of instructions, educational materials, and care plan provided today and agreed to receive a mailed copy of patient instructions, educational materials, and care plan.   [name], PharmD Clinical Pharmacist [facility] [phone]

## 2020-10-14 ENCOUNTER — Telehealth: Payer: Medicare HMO

## 2020-10-23 ENCOUNTER — Other Ambulatory Visit: Payer: Self-pay | Admitting: Internal Medicine

## 2020-10-29 ENCOUNTER — Telehealth: Payer: Self-pay

## 2020-10-29 NOTE — Chronic Care Management (AMB) (Signed)
    Chronic Care Management Pharmacy Assistant   Name: Andres [name]  MRN: [id number] DOB: 08/15/1950   Reason for Encounter: Disease State-DM    Recent office visits:  09/29/20 -Caryl Ada, PA(Urgent Care)  Recent consult visits:  None noted  Hospital visits:  None in previous 6 months  Medications: Outpatient Encounter Medications as of 10/29/2020  Medication Sig  . Accu-Chek FastClix Lancets MISC One tab po qd dx; e11.65  . ACCU-CHEK GUIDE test strip Use to check BS qd dx: E11.65  . Arginine 500 MG CAPS Take by mouth. Daily  . aspirin 81 MG tablet Take 81 mg by mouth daily.  . cholecalciferol (VITAMIN D) 1000 UNITS tablet Take 4,000 Units by mouth daily.  . Cyanocobalamin (VITAMIN B 12 PO) Take 1 capsule by mouth. 2061mcg daily  . fluticasone (CUTIVATE) 0.05 % cream Apply topically 2 (two) times daily.  [name] [name] XR 50-1000 MG TB24 TAKE ONE TABLET BY MOUTH TWICE DAILY  . levocetirizine (XYZAL) 5 MG tablet Take 1 tablet (5 mg total) by mouth every evening.  . Multiple Vitamin (MULTIVITAMIN) tablet Take 1 tablet by mouth daily.  [name] olmesartan-hydrochlorothiazide (BENICAR HCT) 20-12.5 MG tablet TAKE 1 TABLET BY MOUTH ONCE DAILY.  . rosuvastatin (CRESTOR) 10 MG tablet TAKE (1) TABLET BY MOUTH DAILY.  . tadalafil (CIALIS) 5 MG tablet TAKE 1 TABLET BY MOUTH ONCE A DAY.   No facility-administered encounter medications on file as of 10/29/2020.   Recent Relevant Labs: Lab Results  Component Value Date/Time   HGBA1C 7.0 (H) 06/29/2020 03:13 PM   HGBA1C 6.8 (H) 03/09/2020 11:54 AM   HGBA1C 7.7 02/13/2018 12:00 AM   MICROALBUR 30 11/07/2019 11:30 AM   MICROALBUR 10 10/30/2018 10:29 AM    Kidney Function Lab Results  Component Value Date/Time   CREATININE 1.21 06/29/2020 03:13 PM   CREATININE 1.22 03/09/2020 11:54 AM   GFRNONAA 62 06/29/2020 03:13 PM   GFRAA 71 06/29/2020 03:13 PM    . Current antihyperglycemic regimen:  o [name] XR 50-1000 mg Take 1 tablet twice a  day  . What recent interventions/DTPs have been made to improve glycemic control:  o None noted  . Have there been any recent hospitalizations or ED visits since last visit with CPP? No   . Patient denies hypoglycemic symptoms, including Pale, Sweaty, Shaky, Hungry, Nervous/irritable and Vision changes   . Patient denies hyperglycemic symptoms, including blurry vision, excessive thirst, fatigue, polyuria and weakness   . How often are you checking your blood sugar? twice daily   . What are your blood sugars ranging?  o Fasting: na o Before meals: na o After meals: 117-119 in the middle of the day o Bedtime:112-115  . During the week, how often does your blood glucose drop below 70? Never   . Are you checking your feet daily/regularly?  o Patient states he checks his feet daily. He states he recently fractured his tow but it is healing with no concerns.  Adherence Review: Is the patient currently on a STATIN medication? Yes Is the patient currently on ACE/ARB medication? Yes Does the patient have >5 day gap between last estimated fill dates? Yes      Star Rating Drugs: olmesartan-HCT 20-12.5 mg last filled 10/26/20 90DS Rosuvastatin 10 mg last filled 06/19/20 90 DS (patient confirmed he is still taking this.)   Lizbeth Bark Clinical Pharmacist Assistant [phone]

## 2020-11-09 ENCOUNTER — Ambulatory Visit (INDEPENDENT_AMBULATORY_CARE_PROVIDER_SITE_OTHER): Payer: Medicare HMO | Admitting: Internal Medicine

## 2020-11-09 ENCOUNTER — Encounter: Payer: Self-pay | Admitting: Internal Medicine

## 2020-11-09 ENCOUNTER — Other Ambulatory Visit: Payer: Self-pay

## 2020-11-09 VITALS — BP 116/70 | HR 86 | Temp 98.0°F | Ht 69.6 in | Wt 233.6 lb

## 2020-11-09 DIAGNOSIS — I129 Hypertensive chronic kidney disease with stage 1 through stage 4 chronic kidney disease, or unspecified chronic kidney disease: Secondary | ICD-10-CM | POA: Diagnosis not present

## 2020-11-09 DIAGNOSIS — R350 Frequency of micturition: Secondary | ICD-10-CM

## 2020-11-09 DIAGNOSIS — N182 Chronic kidney disease, stage 2 (mild): Secondary | ICD-10-CM | POA: Diagnosis not present

## 2020-11-09 DIAGNOSIS — E7800 Pure hypercholesterolemia, unspecified: Secondary | ICD-10-CM

## 2020-11-09 DIAGNOSIS — E1122 Type 2 diabetes mellitus with diabetic chronic kidney disease: Secondary | ICD-10-CM

## 2020-11-09 DIAGNOSIS — N401 Enlarged prostate with lower urinary tract symptoms: Secondary | ICD-10-CM

## 2020-11-09 DIAGNOSIS — R202 Paresthesia of skin: Secondary | ICD-10-CM

## 2020-11-09 DIAGNOSIS — E6609 Other obesity due to excess calories: Secondary | ICD-10-CM

## 2020-11-09 DIAGNOSIS — Z79899 Other long term (current) drug therapy: Secondary | ICD-10-CM | POA: Diagnosis not present

## 2020-11-09 DIAGNOSIS — Z794 Long term (current) use of insulin: Secondary | ICD-10-CM | POA: Diagnosis not present

## 2020-11-09 DIAGNOSIS — Z0000 Encounter for general adult medical examination without abnormal findings: Secondary | ICD-10-CM

## 2020-11-09 DIAGNOSIS — Z6833 Body mass index (BMI) 33.0-33.9, adult: Secondary | ICD-10-CM

## 2020-11-09 LAB — POCT URINALYSIS DIPSTICK
Bilirubin, UA: NEGATIVE
Blood, UA: NEGATIVE
Glucose, UA: NEGATIVE
Ketones, UA: NEGATIVE
Leukocytes, UA: NEGATIVE
Nitrite, UA: NEGATIVE
Protein, UA: NEGATIVE
Spec Grav, UA: 1.02 (ref 1.010–1.025)
Urobilinogen, UA: 0.2 E.U./dL
pH, UA: 5.5 (ref 5.0–8.0)

## 2020-11-09 MED ORDER — OLMESARTAN MEDOXOMIL-HCTZ 20-12.5 MG PO TABS: 1.0000 | ORAL_TABLET | Freq: Every day | ORAL | 2 refills | Status: DC

## 2020-11-09 MED ORDER — ROSUVASTATIN CALCIUM 10 MG PO TABS: ORAL_TABLET | ORAL | 2 refills | Status: DC

## 2020-11-09 NOTE — Progress Notes (Signed)
I,Yamilka [employer] as a Education administrator for [name], MD.,have documented all relevant documentation on the behalf of [name], MD,as directed by  [name], MD while in the presence of [name], MD. This visit occurred during the SARS-CoV-2 public health emergency.  Safety protocols were in place, including screening questions prior to the visit, additional usage of staff PPE, and extensive cleaning of exam room while observing appropriate contact time as indicated for disinfecting solutions.  Subjective:     Patient ID: Andres [name] , male    DOB: 1950-11-03 , 70 y.o.   MRN: [id number]   Chief Complaint  Patient presents with  . Annual Exam  . Diabetes  . Hypertension    HPI  Patient here for a physical. He reports compliance with meds. He denies having any headaches, chest pain and shortness of breath. He has no specific concerns at this time.   Diabetes He presents for his follow-up diabetic visit. He has type 2 diabetes mellitus. His disease course has been stable. There are no hypoglycemic associated symptoms. Pertinent negatives for diabetes include no blurred vision, no chest pain, no polydipsia, no polyphagia and no polyuria. There are no hypoglycemic complications. Diabetic complications include nephropathy. Risk factors for coronary artery disease include diabetes mellitus, dyslipidemia, hypertension and male sex. He is following a diabetic diet. His breakfast blood glucose is taken between 8-9 am. His breakfast blood glucose range is generally 90-110 mg/dl. An ACE inhibitor/angiotensin II receptor blocker is being taken. Eye exam is not current.  Hypertension This is a chronic problem. The current episode started more than 1 year ago. The problem has been gradually improving since onset. The problem is controlled. Pertinent negatives include no blurred vision, chest pain, palpitations or shortness of breath. Past treatments include angiotensin  blockers and diuretics. The current treatment provides moderate improvement.     Past Medical History:  Diagnosis Date  . Diabetes ([place])   . High cholesterol   . Hypertension    states under control with med., has been on med. x 5 yr.  . Seasonal allergies   . Umbilical hernia 12/1605     Family History  Problem Relation Age of Onset  . Healthy Mother   . Early death Father      Current Outpatient Medications:  .  Accu-Chek FastClix Lancets MISC, One tab po qd dx; e11.65, Disp: 100 each, Rfl: 11 .  ACCU-CHEK GUIDE test strip, Use to check BS qd dx: E11.65, Disp: 100 each, Rfl: 11 .  Arginine 500 MG CAPS, Take by mouth. Daily, Disp: , Rfl:  .  aspirin 81 MG tablet, Take 81 mg by mouth daily., Disp: , Rfl:  .  cholecalciferol (VITAMIN D) 1000 UNITS tablet, Take 4,000 Units by mouth daily., Disp: , Rfl:  .  Cyanocobalamin (VITAMIN B 12 PO), Take 1 capsule by mouth. 2066mg daily, Disp: , Rfl:  .  fluticasone (CUTIVATE) 0.05 % cream, Apply topically 2 (two) times daily., Disp: 60 g, Rfl: 2 .  [name] XR 50-1000 MG TB24, TAKE ONE TABLET BY MOUTH TWICE DAILY, Disp: 180 tablet, Rfl: 0 .  levocetirizine (XYZAL) 5 MG tablet, Take 1 tablet (5 mg total) by mouth every evening., Disp: 90 tablet, Rfl: 2 .  Multiple Vitamin (MULTIVITAMIN) tablet, Take 1 tablet by mouth daily., Disp: , Rfl:  .  tadalafil (CIALIS) 5 MG tablet, TAKE 1 TABLET BY MOUTH ONCE A DAY., Disp: 30 tablet, Rfl: 2 .  olmesartan-hydrochlorothiazide (BENICAR  HCT) 20-12.5 MG tablet, Take 1 tablet by mouth daily., Disp: 90 tablet, Rfl: 2 .  rosuvastatin (CRESTOR) 10 MG tablet, One tab po qd, Disp: 90 tablet, Rfl: 2   No Known Allergies   Men's preventive visit. Patient Health Questionnaire (PHQ-2) is  [place] Office Visit from 11/09/2020 in [facility]  PHQ-2 Total Score 0    . Patient is on a healthy diet. Marital status: Legally Separated. Relevant history for alcohol use is:  Social History    Substance and Sexual Activity  Alcohol Use No  . Relevant history for tobacco use is:  Social History   Tobacco Use  Smoking Status Former Smoker  . Packs/day: 0.50  . Years: 22.00  . Pack years: 11.00  . Types: Cigarettes  . Quit date: 07/11/2011  . Years since quitting: 9.3  Smokeless Tobacco Never Used  Tobacco Comment   he has quit  .   Review of Systems  Constitutional: Negative.   HENT: Negative.   Eyes: Negative.  Negative for blurred vision.  Respiratory: Negative.  Negative for shortness of breath.   Cardiovascular: Negative.  Negative for chest pain and palpitations.  Gastrointestinal: Negative.   Endocrine: Negative.  Negative for polydipsia, polyphagia and polyuria.  Genitourinary: Negative.   Musculoskeletal: Negative.   Neurological: Positive for numbness.       He c/o numbness/tingling in LE. Unable to determine what triggers his sx. Denies recent trauma/fall. Sx usually occur at night. Denies LE weakness.   Hematological: Negative.   Psychiatric/Behavioral: Negative.      Today's Vitals   11/09/20 1113  BP: 116/70  Pulse: 86  Temp: 98 F (36.7 C)  TempSrc: Oral  Weight: 233 lb 9.6 oz (106 kg)  Height: 5' 9.6" (1.768 m)  PainSc: 0-No pain   Body mass index is 33.9 kg/m.  Wt Readings from Last 3 Encounters:  11/17/20 235 lb 12.8 oz (107 kg)  11/12/20 230 lb (104.3 kg)  11/09/20 233 lb 9.6 oz (106 kg)    Objective:  Physical Exam Vitals and nursing note reviewed.  Constitutional:      Appearance: Normal appearance.  HENT:     Head: Normocephalic and atraumatic.     Right Ear: Tympanic membrane, ear canal and external ear normal.     Left Ear: Tympanic membrane, ear canal and external ear normal.     Nose:     Comments: Masked     Mouth/Throat:     Comments: Masked  Eyes:     Extraocular Movements: Extraocular movements intact.     Conjunctiva/sclera: Conjunctivae normal.     Pupils: Pupils are equal, round, and reactive to light.   Cardiovascular:     Rate and Rhythm: Normal rate and regular rhythm.     Pulses: Normal pulses.          Dorsalis pedis pulses are 2+ on the right side and 2+ on the left side.     Heart sounds: Normal heart sounds.  Pulmonary:     Effort: Pulmonary effort is normal.     Breath sounds: Normal breath sounds.  Chest:  Breasts:     Right: Normal. No swelling, bleeding, inverted nipple, mass or nipple discharge.     Left: Normal. No swelling, bleeding, inverted nipple, mass or nipple discharge.    Abdominal:     General: Bowel sounds are normal.     Palpations: Abdomen is soft.  Genitourinary:    Comments: Deferred  Musculoskeletal:  General: Normal range of motion.     Cervical back: Normal range of motion and neck supple.  Feet:     Right foot:     Protective Sensation: 5 sites tested. 5 sites sensed.     Skin integrity: Callus and dry skin present.     Toenail Condition: Right toenails are abnormally thick.     Left foot:     Protective Sensation: 5 sites tested. 5 sites sensed.     Skin integrity: Callus and dry skin present.     Toenail Condition: Left toenails are abnormally thick.  Skin:    General: Skin is warm.  Neurological:     General: No focal deficit present.     Mental Status: He is alert.  Psychiatric:        Mood and Affect: Mood normal.        Behavior: Behavior normal.         Assessment And Plan:    1. Encounter for general adult medical examination w/o abnormal findings Comments: A full exam was performed. DRE deferred, he is followed by Urologist for prostate exams. PATIENT IS ADVISED TO GET 30-45 MINUTES REGULAR EXERCISE NO LESS THAN FOUR TO FIVE DAYS PER WEEK - BOTH WEIGHTBEARING EXERCISES AND AEROBIC ARE RECOMMENDED.  PATIENT IS ADVISED TO FOLLOW A HEALTHY DIET WITH AT LEAST SIX FRUITS/VEGGIES PER DAY, DECREASE INTAKE OF RED MEAT, AND TO INCREASE FISH INTAKE TO TWO DAYS PER WEEK.  MEATS/FISH SHOULD NOT BE FRIED, BAKED OR BROILED IS  PREFERABLE.  IT IS ALSO IMPORTANT TO CUT BACK ON YOUR SUGAR INTAKE. PLEASE AVOID ANYTHING WITH ADDED SUGAR, CORN SYRUP OR OTHER SWEETENERS. IF YOU MUST USE A SWEETENER, YOU CAN TRY STEVIA. IT IS ALSO IMPORTANT TO AVOID ARTIFICIALLY SWEETENERS AND DIET BEVERAGES. LASTLY, I SUGGEST WEARING SPF 50 SUNSCREEN ON EXPOSED PARTS AND ESPECIALLY WHEN IN THE DIRECT SUNLIGHT FOR AN EXTENDED PERIOD OF TIME.  PLEASE AVOID FAST FOOD RESTAURANTS AND INCREASE YOUR WATER INTAKE.   2. Type 2 diabetes mellitus with stage 2 chronic kidney disease, with long-term current use of insulin (HCC) Comments: Diabetic foot exam was performed. I DISCUSSED WITH THE PATIENT AT LENGTH REGARDING THE GOALS OF GLYCEMIC CONTROL AND POSSIBLE LONG-TERM COMPLICATIONS.  I  ALSO STRESSED THE IMPORTANCE OF COMPLIANCE WITH HOME GLUCOSE MONITORING, DIETARY RESTRICTIONS INCLUDING AVOIDANCE OF SUGARY DRINKS/PROCESSED FOODS,  ALONG WITH REGULAR EXERCISE.  I  ALSO STRESSED THE IMPORTANCE OF ANNUAL EYE EXAMS, SELF FOOT CARE AND COMPLIANCE WITH OFFICE VISITS.  - POCT Urinalysis Dipstick (81002) - CMP14+EGFR - Lipid panel - CBC no Diff - Hemoglobin A1c  3. Hypertensive nephropathy Comments: Chronic, well controlled. EKG performed, NSR w/ nonspecific T abnormality. Advised to follow low sodium diet. He will f/u in 4 months for re-evaluation. - EKG 12-Lead  4. Paresthesias  Comments: Possibly due to diabetic neuropathy. I will check vitamin B12 level today.  5. Pure hypercholesterolemia  Chronic, I will check lipid panel today. He was given refill of rosuvastatin. Encouraged to limit his intake of fried foods, increase exercise and to also increase his fiber intake.   6. Benign prostatic hyperplasia with urinary frequency Comments: I will check PSA and forward results to Dr. Louis Meckel at [facility].  - PSA  7. Class 1 obesity due to excess calories with serious comorbidity and body mass index (BMI) of 33.0 to 33.9 in adult Comments: He is  encouraged to strive for BMI less than 30 to decrease cardiac risk. Advised to aim for at  least 150 minutes of exercise per week.  8. Drug therapy - TSH - Vitamin B12  Patient was given opportunity to ask questions. Patient verbalized understanding of the plan and was able to repeat key elements of the plan. All questions were answered to their satisfaction.   I, [name], MD, have reviewed all documentation for this visit. The documentation on 11/09/20 for the exam, diagnosis, procedures, and orders are all accurate and complete.  THE PATIENT IS ENCOURAGED TO PRACTICE SOCIAL DISTANCING DUE TO THE COVID-19 PANDEMIC.

## 2020-11-10 LAB — CBC
Hematocrit: 39.7 % (ref 37.5–51.0)
Hemoglobin: 13.3 g/dL (ref 13.0–17.7)
MCH: 30.2 pg (ref 26.6–33.0)
MCHC: 33.5 g/dL (ref 31.5–35.7)
MCV: 90 fL (ref 79–97)
Platelets: 216 10*3/uL (ref 150–450)
RBC: 4.41 x10E6/uL (ref 4.14–5.80)
RDW: 12.6 % (ref 11.6–15.4)
WBC: 7.7 10*3/uL (ref 3.4–10.8)

## 2020-11-10 LAB — LIPID PANEL
Chol/HDL Ratio: 3.4 ratio (ref 0.0–5.0)
Cholesterol, Total: 102 mg/dL (ref 100–199)
HDL: 30 mg/dL — ABNORMAL LOW (ref >39)
LDL Chol Calc (NIH): 48 mg/dL (ref 0–99)
Triglycerides: 132 mg/dL (ref 0–149)
VLDL Cholesterol Cal: 24 mg/dL (ref 5–40)

## 2020-11-10 LAB — CMP14+EGFR
ALT: 19 IU/L (ref 0–44)
AST: 20 IU/L (ref 0–40)
Albumin/Globulin Ratio: 1.6 (ref 1.2–2.2)
Albumin: 4.5 g/dL (ref 3.8–4.8)
Alkaline Phosphatase: 57 IU/L (ref 44–121)
BUN/Creatinine Ratio: 10 (ref 10–24)
BUN: 13 mg/dL (ref 8–27)
Bilirubin Total: 0.3 mg/dL (ref 0.0–1.2)
CO2: 25 mmol/L (ref 20–29)
Calcium: 9.3 mg/dL (ref 8.6–10.2)
Chloride: 98 mmol/L (ref 96–106)
Creatinine, Ser: 1.24 mg/dL (ref 0.76–1.27)
Globulin, Total: 2.9 g/dL (ref 1.5–4.5)
Glucose: 111 mg/dL — ABNORMAL HIGH (ref 65–99)
Potassium: 4.7 mmol/L (ref 3.5–5.2)
Sodium: 139 mmol/L (ref 134–144)
Total Protein: 7.4 g/dL (ref 6.0–8.5)
eGFR: 64 mL/min/{1.73_m2} (ref >59)

## 2020-11-10 LAB — HEMOGLOBIN A1C
Est. average glucose Bld gHb Est-mCnc: 154 mg/dL
Hgb A1c MFr Bld: 7 % — ABNORMAL HIGH (ref 4.8–5.6)

## 2020-11-10 LAB — TSH: TSH: 2.26 u[IU]/mL (ref 0.450–4.500)

## 2020-11-10 LAB — PSA: Prostate Specific Ag, Serum: 0.9 ng/mL (ref 0.0–4.0)

## 2020-11-10 LAB — VITAMIN B12: Vitamin B-12: 311 pg/mL (ref 232–1245)

## 2020-11-12 ENCOUNTER — Encounter: Payer: Medicare HMO | Admitting: Internal Medicine

## 2020-11-12 ENCOUNTER — Ambulatory Visit (INDEPENDENT_AMBULATORY_CARE_PROVIDER_SITE_OTHER): Payer: Medicare HMO

## 2020-11-12 VITALS — Ht 71.0 in | Wt 230.0 lb

## 2020-11-12 DIAGNOSIS — Z0000 Encounter for general adult medical examination without abnormal findings: Secondary | ICD-10-CM

## 2020-11-12 NOTE — Patient Instructions (Signed)
Mr. Andres [name] , Thank you for taking time to come for your Medicare Wellness Visit. I appreciate your ongoing commitment to your health goals. Please review the following plan we discussed and let me know if I can assist you in the future.   Screening recommendations/referrals: Colonoscopy: completed 04/07/2015 Recommended yearly ophthalmology/optometry visit for glaucoma screening and checkup Recommended yearly dental visit for hygiene and checkup  Vaccinations: Influenza vaccine: completed 05/08/2020, due 02/08/2021 Pneumococcal vaccine: completed 07/23/2018 Tdap vaccine: completed 04/17/2019, due 04/16/2029 Shingles vaccine: discussed   Covid-19:  05/22/2020, 09/17/2019, 08/17/2019  Advanced directives: Please bring a copy of your POA (Power of Attorney) and/or Living Will to your next appointment.   Conditions/risks identified: none  Next appointment: Follow up in one year for your annual wellness visit.   Preventive Care 22 Years and Older, Male Preventive care refers to lifestyle choices and visits with your health care provider that can promote health and wellness. What does preventive care include?  A yearly physical exam. This is also called an annual well check.  Dental exams once or twice a year.  Routine eye exams. Ask your health care provider how often you should have your eyes checked.  Personal lifestyle choices, including:  Daily care of your teeth and gums.  Regular physical activity.  Eating a healthy diet.  Avoiding tobacco and drug use.  Limiting alcohol use.  Practicing safe sex.  Taking low doses of aspirin every day.  Taking vitamin and mineral supplements as recommended by your health care provider. What happens during an annual well check? The services and screenings done by your health care provider during your annual well check will depend on your age, overall health, lifestyle risk factors, and family history of disease. Counseling  Your health care  provider may ask you questions about your:  Alcohol use.  Tobacco use.  Drug use.  Emotional well-being.  Home and relationship well-being.  Sexual activity.  Eating habits.  History of falls.  Memory and ability to understand (cognition).  Work and work Statistician. Screening  You may have the following tests or measurements:  Height, weight, and BMI.  Blood pressure.  Lipid and cholesterol levels. These may be checked every 5 years, or more frequently if you are over 68 years old.  Skin check.  Lung cancer screening. You may have this screening every year starting at age 45 if you have a 30-pack-year history of smoking and currently smoke or have quit within the past 15 years.  Fecal occult blood test (FOBT) of the stool. You may have this test every year starting at age 68.  Flexible sigmoidoscopy or colonoscopy. You may have a sigmoidoscopy every 5 years or a colonoscopy every 10 years starting at age 68.  Prostate cancer screening. Recommendations will vary depending on your family history and other risks.  Hepatitis C blood test.  Hepatitis B blood test.  Sexually transmitted disease (STD) testing.  Diabetes screening. This is done by checking your blood sugar (glucose) after you have not eaten for a while (fasting). You may have this done every 1-3 years.  Abdominal aortic aneurysm (AAA) screening. You may need this if you are a current or former smoker.  Osteoporosis. You may be screened starting at age 8 if you are at high risk. Talk with your health care provider about your test results, treatment options, and if necessary, the need for more tests. Vaccines  Your health care provider may recommend certain vaccines, such as:  Influenza vaccine. This  is recommended every year.  Tetanus, diphtheria, and acellular pertussis (Tdap, Td) vaccine. You may need a Td booster every 10 years.  Zoster vaccine. You may need this after age 70.  Pneumococcal  13-valent conjugate (PCV13) vaccine. One dose is recommended after age 1.  Pneumococcal polysaccharide (PPSV23) vaccine. One dose is recommended after age 37. Talk to your health care provider about which screenings and vaccines you need and how often you need them. This information is not intended to replace advice given to you by your health care provider. Make sure you discuss any questions you have with your health care provider. Document Released: 07/24/2015 Document Revised: 03/16/2016 Document Reviewed: 04/28/2015 Elsevier Interactive Patient Education  2017 [place] Prevention in the Home Falls can cause injuries. They can happen to people of all ages. There are many things you can do to make your home safe and to help prevent falls. What can I do on the outside of my home?  Regularly fix the edges of walkways and driveways and fix any cracks.  Remove anything that might make you trip as you walk through a door, such as a raised step or threshold.  Trim any bushes or trees on the path to your home.  Use bright outdoor lighting.  Clear any walking paths of anything that might make someone trip, such as rocks or tools.  Regularly check to see if handrails are loose or broken. Make sure that both sides of any steps have handrails.  Any raised decks and porches should have guardrails on the edges.  Have any leaves, snow, or ice cleared regularly.  Use sand or salt on walking paths during winter.  Clean up any spills in your garage right away. This includes oil or grease spills. What can I do in the bathroom?  Use night lights.  Install grab bars by the toilet and in the tub and shower. Do not use towel bars as grab bars.  Use non-skid mats or decals in the tub or shower.  If you need to sit down in the shower, use a plastic, non-slip stool.  Keep the floor dry. Clean up any water that spills on the floor as soon as it happens.  Remove soap buildup in the  tub or shower regularly.  Attach bath mats securely with double-sided non-slip rug tape.  Do not have throw rugs and other things on the floor that can make you trip. What can I do in the bedroom?  Use night lights.  Make sure that you have a light by your bed that is easy to reach.  Do not use any sheets or blankets that are too big for your bed. They should not hang down onto the floor.  Have a firm chair that has side arms. You can use this for support while you get dressed.  Do not have throw rugs and other things on the floor that can make you trip. What can I do in the kitchen?  Clean up any spills right away.  Avoid walking on wet floors.  Keep items that you use a lot in easy-to-reach places.  If you need to reach something above you, use a strong step stool that has a grab bar.  Keep electrical cords out of the way.  Do not use floor polish or wax that makes floors slippery. If you must use wax, use non-skid floor wax.  Do not have throw rugs and other things on the floor that can make you  trip. What can I do with my stairs?  Do not leave any items on the stairs.  Make sure that there are handrails on both sides of the stairs and use them. Fix handrails that are broken or loose. Make sure that handrails are as long as the stairways.  Check any carpeting to make sure that it is firmly attached to the stairs. Fix any carpet that is loose or worn.  Avoid having throw rugs at the top or bottom of the stairs. If you do have throw rugs, attach them to the floor with carpet tape.  Make sure that you have a light switch at the top of the stairs and the bottom of the stairs. If you do not have them, ask someone to add them for you. What else can I do to help prevent falls?  Wear shoes that:  Do not have high heels.  Have rubber bottoms.  Are comfortable and fit you well.  Are closed at the toe. Do not wear sandals.  If you use a stepladder:  Make sure that it  is fully opened. Do not climb a closed stepladder.  Make sure that both sides of the stepladder are locked into place.  Ask someone to hold it for you, if possible.  Clearly mark and make sure that you can see:  Any grab bars or handrails.  First and last steps.  Where the edge of each step is.  Use tools that help you move around (mobility aids) if they are needed. These include:  Canes.  Walkers.  Scooters.  Crutches.  Turn on the lights when you go into a dark area. Replace any light bulbs as soon as they burn out.  Set up your furniture so you have a clear path. Avoid moving your furniture around.  If any of your floors are uneven, fix them.  If there are any pets around you, be aware of where they are.  Review your medicines with your doctor. Some medicines can make you feel dizzy. This can increase your chance of falling. Ask your doctor what other things that you can do to help prevent falls. This information is not intended to replace advice given to you by your health care provider. Make sure you discuss any questions you have with your health care provider. Document Released: 04/23/2009 Document Revised: 12/03/2015 Document Reviewed: 08/01/2014 Elsevier Interactive Patient Education  2017 [employer].

## 2020-11-12 NOTE — Progress Notes (Signed)
I connected with Bristol Flamm today by telephone and verified that I am speaking with the correct person using two identifiers. Location patient: home Location provider: work Persons participating in the virtual visit: Andres [name], [place] LPN.   I discussed the limitations, risks, security and privacy concerns of performing an evaluation and management service by telephone and the availability of in person appointments. I also discussed with the patient that there may be a patient responsible charge related to this service. The patient expressed understanding and verbally consented to this telephonic visit.    Interactive audio and video telecommunications were attempted between this provider and patient, however failed, due to patient having technical difficulties OR patient did not have access to video capability.  We continued and completed visit with audio only.     Vital signs may be patient reported or missing.  Subjective:   Andres [name] is a 70 y.o. male who presents for Medicare Annual/Subsequent preventive examination.  Review of Systems     Cardiac Risk Factors include: advanced age (>73men, >36 women);diabetes mellitus;hypertension;male gender     Objective:    Today's Vitals   11/12/20 1026  Weight: 230 lb (104.3 kg)  Height: 5\' 11"  (1.803 m)   Body mass index is 32.08 kg/m.  Advanced Directives 11/12/2020 11/07/2019 10/30/2018 09/25/2014 09/19/2014  Does Patient Have a Medical Advance Directive? Yes Yes No Yes Yes  Type of Advance Directive Living will Living will - Healthcare Power of [place];Living will Living will;Healthcare Power of Attorney  Does patient want to make changes to medical advance directive? - - - No - Patient declined No - Patient declined  Copy of Healthcare Power of Attorney in Chart? - - - No - copy requested -  Would patient like information on creating a medical advance directive? - - Yes (MAU/Ambulatory/Procedural Areas -  Information given) - -    Current Medications (verified) Outpatient Encounter Medications as of 11/12/2020  Medication Sig  . Accu-Chek FastClix Lancets MISC One tab po qd dx; e11.65  . ACCU-CHEK GUIDE test strip Use to check BS qd dx: E11.65  . Arginine 500 MG CAPS Take by mouth. Daily  . aspirin 81 MG tablet Take 81 mg by mouth daily.  . cholecalciferol (VITAMIN D) 1000 UNITS tablet Take 4,000 Units by mouth daily.  . Cyanocobalamin (VITAMIN B 12 PO) Take 1 capsule by mouth. [birth year]1983 daily  . fluticasone (CUTIVATE) 0.05 % cream Apply topically 2 (two) times daily.  [name] XR 50-1000 MG TB24 TAKE ONE TABLET BY MOUTH TWICE DAILY  . levocetirizine (XYZAL) 5 MG tablet Take 1 tablet (5 mg total) by mouth every evening.  . Multiple Vitamin (MULTIVITAMIN) tablet Take 1 tablet by mouth daily.  [name] olmesartan-hydrochlorothiazide (BENICAR HCT) 20-12.5 MG tablet Take 1 tablet by mouth daily.  . rosuvastatin (CRESTOR) 10 MG tablet One tab po qd  . tadalafil (CIALIS) 5 MG tablet TAKE 1 TABLET BY MOUTH ONCE A DAY.   No facility-administered encounter medications on file as of 11/12/2020.    Allergies (verified) Patient has no known allergies.   History: Past Medical History:  Diagnosis Date  . Diabetes (HCC)   . High cholesterol   . Hypertension    states under control with med., has been on med. x 5 yr.  . Seasonal allergies   . Umbilical hernia 09/2014   Past Surgical History:  Procedure Laterality Date  . INSERTION OF MESH N/A 09/25/2014   Procedure: INSERTION OF MESH;  Surgeon: 09/27/2014  Georgette Dover, MD;  Location: [facility];  Service: General;  Laterality: N/A;  . ORIF PATELLA FRACTURE Right 06/16/2008  . TOTAL HIP ARTHROPLASTY Left 12/17/2007  . UMBILICAL HERNIA REPAIR N/A 09/25/2014   Procedure: UMBILICAL HERNIA REPAIR WITH MESH;  Surgeon: Donnie Mesa, MD;  Location: [facility];  Service: General;  Laterality: N/A;   Family History  Problem Relation Age of  Onset  . Healthy Mother   . Early death Father    Social History   Socioeconomic History  . Marital status: Legally Separated    Spouse name: Not on file  . Number of children: Not on file  . Years of education: Not on file  . Highest education level: Not on file  Occupational History  . Occupation: retired  Tobacco Use  . Smoking status: Former Smoker    Packs/day: 0.50    Years: 22.00    Pack years: 11.00    Types: Cigarettes    Quit date: 07/11/2011    Years since quitting: 9.3  . Smokeless tobacco: Never Used  . Tobacco comment: he has quit  Vaping Use  . Vaping Use: Never used  Substance and Sexual Activity  . Alcohol use: No  . Drug use: No  . Sexual activity: Yes  Other Topics Concern  . Not on file  Social History Narrative  . Not on file   Social Determinants of Health   Financial Resource Strain: Low Risk   . Difficulty of Paying Living Expenses: Not hard at all  Food Insecurity: No Food Insecurity  . Worried About Charity fundraiser in the Last Year: Never true  . Ran Out of Food in the Last Year: Never true  Transportation Needs: No Transportation Needs  . Lack of Transportation (Medical): No  . Lack of Transportation (Non-Medical): No  Physical Activity: Sufficiently Active  . Days of Exercise per Week: 3 days  . Minutes of Exercise per Session: 60 min  Stress: No Stress Concern Present  . Feeling of Stress : Not at all  Social Connections: Not on file    Tobacco Counseling Counseling given: Not Answered Comment: he has quit   Clinical Intake:  Pre-visit preparation completed: Yes  Pain : No/denies pain     Nutritional Status: BMI > 30  Obese Nutritional Risks: None Diabetes: Yes  How often do you need to have someone help you when you read instructions, pamphlets, or other written materials from your doctor or pharmacy?: 1 - Never What is the last grade level you completed in school?: 12th grade  Diabetic? Yes Nutrition Risk  Assessment:  Has the patient had any N/V/D within the last 2 months?  No  Does the patient have any non-healing wounds?  No  Has the patient had any unintentional weight loss or weight gain?  No   Diabetes:  Is the patient diabetic?  Yes  If diabetic, was a CBG obtained today?  No  Did the patient bring in their glucometer from home?  No  How often do you monitor your CBG's? Twice daily.   Financial Strains and Diabetes Management:  Are you having any financial strains with the device, your supplies or your medication? No .  Does the patient want to be seen by Chronic Care Management for management of their diabetes?  No  Would the patient like to be referred to a Nutritionist or for Diabetic Management?  No   Diabetic Exams:  Diabetic Eye Exam: Completed 06/25/2020 Diabetic  Foot Exam: Completed 11/09/2020   Interpreter Needed?: No  Information entered by :: NAllen LPN   Activities of Daily Living In your present state of health, do you have any difficulty performing the following activities: 11/12/2020 11/09/2020  Hearing? N N  Vision? N N  Difficulty concentrating or making decisions? N N  Walking or climbing stairs? N N  Dressing or bathing? N N  Doing errands, shopping? N N  Preparing Food and eating ? N -  Using the Toilet? N -  In the past six months, have you accidently leaked urine? N -  Do you have problems with loss of bowel control? N -  Managing your Medications? N -  Managing your Finances? N -  Housekeeping or managing your Housekeeping? N -  Some recent data might be hidden    Patient Care Team: Glendale Chard, MD as PCP - General (Internal Medicine) Harl Bowie Alphonse Guild, MD as PCP - Cardiology (Cardiology) Rex Kras, Claudette Stapler, RN as Case Manager Mayford Knife, [facility] (Pharmacist)  Indicate any recent Medical Services you may have received from other than [facility] providers in the past year (date may be approximate).     Assessment:   This is a routine  wellness examination for [place].  Hearing/Vision screen No exam data present  Dietary issues and exercise activities discussed: Current Exercise Habits: Home exercise routine, Type of exercise: walking, Time (Minutes): 60, Frequency (Times/Week): 3, Weekly Exercise (Minutes/Week): 180  Goals Addressed            This Visit's Progress   . Patient Stated       11/12/2020, stay in shape      Depression Screen PHQ 2/9 Scores 11/12/2020 11/09/2020 11/07/2019 10/30/2018 07/23/2018 04/11/2018  PHQ - 2 Score 0 0 0 0 0 0  PHQ- 9 Score - - 0 - - -    Fall Risk Fall Risk  11/12/2020 11/09/2020 11/07/2019 03/05/2019 10/30/2018  Falls in the past year? 0 0 0 0 0  Risk for fall due to : Medication side effect - Medication side effect - -  Follow up Falls evaluation completed;Education provided;Falls prevention discussed - Falls evaluation completed;Education provided;Falls prevention discussed - -    FALL RISK PREVENTION PERTAINING TO THE HOME:  Any stairs in or around the home? No  If so, are there any without handrails? n/a Home free of loose throw rugs in walkways, pet beds, electrical cords, etc? Yes  Adequate lighting in your home to reduce risk of falls? Yes   ASSISTIVE DEVICES UTILIZED TO PREVENT FALLS:  Life alert? No  Use of a cane, walker or w/c? No  Grab bars in the bathroom? No  Shower chair or bench in shower? No  Elevated toilet seat or a handicapped toilet? Yes   TIMED UP AND GO:  Was the test performed? No .  Cognitive Function:     6CIT Screen 11/12/2020 11/07/2019 10/30/2018  What Year? 0 points 0 points 0 points  What month? 0 points 0 points 0 points  What time? 0 points 0 points 0 points  Count back from 20 0 points 0 points 0 points  Months in reverse 0 points 0 points 0 points  Repeat phrase 0 points 2 points 2 points  Total Score 0 2 2    Immunizations Immunization History  Administered Date(s) Administered  . Fluad Quad(high Dose 65+) 05/08/2020  . Influenza,  High Dose Seasonal PF 04/11/2018, 04/01/2019  . Moderna SARS-COV2 Booster Vaccination 05/22/2020  . Moderna  Sars-Covid-2 Vaccination 08/17/2019, 09/17/2019  . Pneumococcal Polysaccharide-23 07/23/2018  . Tdap 04/17/2019    TDAP status: Up to date  Flu Vaccine status: Up to date  Pneumococcal vaccine status: Up to date  Covid-19 vaccine status: Completed vaccines  Qualifies for Shingles Vaccine? Yes   Zostavax completed No   Shingrix Completed?: No.    Education has been provided regarding the importance of this vaccine. Patient has been advised to call insurance company to determine out of pocket expense if they have not yet received this vaccine. Advised may also receive vaccine at local pharmacy or Health Dept. Verbalized acceptance and understanding.  Screening Tests Health Maintenance  Topic Date Due  . INFLUENZA VACCINE  02/08/2021  . HEMOGLOBIN A1C  05/12/2021  . OPHTHALMOLOGY EXAM  06/25/2021  . FOOT EXAM  11/09/2021  . COLONOSCOPY (Pts 45-62yrs Insurance coverage will need to be confirmed)  04/06/2025  . TETANUS/TDAP  04/16/2029  . COVID-19 Vaccine  Completed  . Hepatitis C Screening  Completed  . PNA vac Low Risk Adult  Completed  . HPV VACCINES  Aged Out    Health Maintenance  There are no preventive care reminders to display for this patient.  Colorectal cancer screening: Type of screening: Colonoscopy. Completed 04/07/2015. Repeat every 10 years  Lung Cancer Screening: (Low Dose CT Chest recommended if Age 27-80 years, 30 pack-year currently smoking OR have quit w/in 15years.) does not qualify.   Lung Cancer Screening Referral: no  Additional Screening:  Hepatitis C Screening: does qualify; Completed 11/24/2012  Vision Screening: Recommended annual ophthalmology exams for early detection of glaucoma and other disorders of the eye. Is the patient up to date with their annual eye exam?  Yes  Who is the provider or what is the name of the office in which the  patient attends annual eye exams? [facility] If pt is not established with a provider, would they like to be referred to a provider to establish care? No .   Dental Screening: Recommended annual dental exams for proper oral hygiene  Community Resource Referral / Chronic Care Management: CRR required this visit?  No   CCM required this visit?  No      Plan:     I have personally reviewed and noted the following in the patient's chart:   . Medical and social history . Use of alcohol, tobacco or illicit drugs  . Current medications and supplements including opioid prescriptions. Patient is not currently taking opioid prescriptions. . Functional ability and status . Nutritional status . Physical activity . Advanced directives . List of other physicians . Hospitalizations, surgeries, and ER visits in previous 12 months . Vitals . Screenings to include cognitive, depression, and falls . Referrals and appointments  In addition, I have reviewed and discussed with patient certain preventive protocols, quality metrics, and best practice recommendations. A written personalized care plan for preventive services as well as general preventive health recommendations were provided to patient.     Kellie Simmering, LPN   [id number]/09/[id number]   Nurse Notes:

## 2020-11-17 ENCOUNTER — Telehealth: Payer: Medicare HMO

## 2020-11-17 ENCOUNTER — Other Ambulatory Visit: Payer: Self-pay

## 2020-11-17 ENCOUNTER — Telehealth: Payer: Self-pay

## 2020-11-17 ENCOUNTER — Ambulatory Visit (INDEPENDENT_AMBULATORY_CARE_PROVIDER_SITE_OTHER): Payer: Medicare HMO

## 2020-11-17 VITALS — BP 124/78 | Temp 98.1°F | Ht 71.0 in | Wt 235.8 lb

## 2020-11-17 DIAGNOSIS — E538 Deficiency of other specified B group vitamins: Secondary | ICD-10-CM | POA: Diagnosis not present

## 2020-11-17 MED ORDER — CYANOCOBALAMIN 1000 MCG/ML IJ SOLN
1000.0000 ug | Freq: Once | INTRAMUSCULAR | Status: AC
  Administered 2020-11-17: 1000 ug via INTRAMUSCULAR

## 2020-11-17 NOTE — Telephone Encounter (Signed)
  Chronic Care Management   Outreach Note  11/17/2020 Name: Andres [name] MRN: [id number] DOB: 03-05-1951  Referred by: Glendale Chard, MD Reason for referral : Chronic Care Management (RN CM FU Call Attempt )   An unsuccessful telephone outreach was attempted today. The patient was referred to the case management team for assistance with care management and care coordination.   Follow Up Plan: A HIPAA compliant phone message was left for the patient providing contact information and requesting a return call.  The care management team will reach out to the patient again over the next 30-45 days.   Barb Merino, RN, BSN, CCM Care Management Coordinator [place] [facility]  Direct Phone: [phone]

## 2020-11-17 NOTE — Progress Notes (Signed)
Patient presents today for his first vitamin B12. YL,RMA

## 2020-11-20 ENCOUNTER — Ambulatory Visit (INDEPENDENT_AMBULATORY_CARE_PROVIDER_SITE_OTHER): Payer: Medicare HMO

## 2020-11-20 ENCOUNTER — Telehealth: Payer: Medicare HMO

## 2020-11-20 DIAGNOSIS — N182 Chronic kidney disease, stage 2 (mild): Secondary | ICD-10-CM

## 2020-11-20 DIAGNOSIS — I129 Hypertensive chronic kidney disease with stage 1 through stage 4 chronic kidney disease, or unspecified chronic kidney disease: Secondary | ICD-10-CM

## 2020-11-20 DIAGNOSIS — E7800 Pure hypercholesterolemia, unspecified: Secondary | ICD-10-CM

## 2020-11-20 DIAGNOSIS — E1122 Type 2 diabetes mellitus with diabetic chronic kidney disease: Secondary | ICD-10-CM

## 2020-11-22 DIAGNOSIS — E6609 Other obesity due to excess calories: Secondary | ICD-10-CM | POA: Insufficient documentation

## 2020-11-22 DIAGNOSIS — N401 Enlarged prostate with lower urinary tract symptoms: Secondary | ICD-10-CM | POA: Insufficient documentation

## 2020-11-22 DIAGNOSIS — R202 Paresthesia of skin: Secondary | ICD-10-CM | POA: Insufficient documentation

## 2020-11-22 NOTE — Patient Instructions (Signed)

## 2020-11-24 ENCOUNTER — Ambulatory Visit: Payer: Medicare HMO

## 2020-11-24 ENCOUNTER — Other Ambulatory Visit: Payer: Self-pay

## 2020-11-24 VITALS — BP 128/80 | HR 85 | Temp 98.4°F | Ht 71.0 in | Wt 232.2 lb

## 2020-11-24 DIAGNOSIS — E538 Deficiency of other specified B group vitamins: Secondary | ICD-10-CM

## 2020-11-24 MED ORDER — CYANOCOBALAMIN 1000 MCG/ML IJ SOLN: 1000.0000 ug | Freq: Once | INTRAMUSCULAR | Status: DC

## 2020-11-24 NOTE — Chronic Care Management (AMB) (Signed)
Chronic Care Management   CCM RN Visit Note  11/20/2020 Name: Andres [name] MRN: [id number] DOB: [birth year]  Subjective: Andres [name] is a 70 y.o. year old male who is a primary care patient of Glendale Chard, MD. The care management team was consulted for assistance with disease management and care coordination needs.    Engaged with patient by telephone for follow up visit in response to provider referral for case management and/or care coordination services.   Consent to Services:  The patient was given information about Chronic Care Management services, agreed to services, and gave verbal consent prior to initiation of services.  Please see initial visit note for detailed documentation.   Patient agreed to services and verbal consent obtained.   Assessment: Review of patient past medical history, allergies, medications, health status, including review of consultants reports, laboratory and other test data, was performed as part of comprehensive evaluation and provision of chronic care management services.   SDOH (Social Determinants of Health) assessments and interventions performed:  Yes, no acute needs   CCM Care Plan  No Known Allergies  Outpatient Encounter Medications as of 11/20/2020  Medication Sig  . Accu-Chek FastClix Lancets MISC One tab po qd dx; e11.65  . ACCU-CHEK GUIDE test strip Use to check BS qd dx: E11.65  . Arginine 500 MG CAPS Take by mouth. Daily  . aspirin 81 MG tablet Take 81 mg by mouth daily.  . cholecalciferol (VITAMIN D) 1000 UNITS tablet Take 4,000 Units by mouth daily.  . Cyanocobalamin (VITAMIN B 12 PO) Take 1 capsule by mouth. 205mg daily  . fluticasone (CUTIVATE) 0.05 % cream Apply topically 2 (two) times daily.  .Marland KitchenJANUMET XR 50-1000 MG TB24 TAKE ONE TABLET BY MOUTH TWICE DAILY  . levocetirizine (XYZAL) 5 MG tablet Take 1 tablet (5 mg total) by mouth every evening.  . Multiple Vitamin (MULTIVITAMIN) tablet Take 1 tablet by mouth daily.  .[name] olmesartan-hydrochlorothiazide (BENICAR HCT) 20-12.5 MG tablet Take 1 tablet by mouth daily.  . rosuvastatin (CRESTOR) 10 MG tablet One tab po qd  . tadalafil (CIALIS) 5 MG tablet TAKE 1 TABLET BY MOUTH ONCE A DAY.   No facility-administered encounter medications on file as of 11/20/2020.    Patient Active Problem List   Diagnosis Date Noted  . Paresthesias 11/22/2020  . Benign prostatic hyperplasia with urinary frequency 11/22/2020  . Class 1 obesity due to excess calories with serious comorbidity and body mass index (BMI) of 33.0 to 33.9 in adult 11/22/2020  . Type 2 diabetes mellitus with stage 2 chronic kidney disease, with long-term current use of insulin ([place] 07/23/2018  . Pure hypercholesterolemia 07/23/2018  . HTN (hypertension) 07/23/2018  . Hypertensive nephropathy 02/13/2018    Conditions to be addressed/monitored:DMII with stage II CKD, Pure Hypercholesterolemia, Hypertensive Nephropathy  Care Plan : Wellness (Adult)  Updates made by LLynne Logan RN since 11/24/2020 12:00 AM  Completed 11/24/2020  Problem: Medication Adherence (Wellness) Resolved 11/20/2020  Priority: High    Long-Range Goal: Medication Adherence Maintained Completed 11/20/2020  Start Date: 07/14/2020  Expected End Date: 10/12/2020  Recent Progress: On track  Priority: High  Note:   Current Barriers:   Ineffective Self Health Maintenance  Financial restraints for cost of [name]  Currently UNABLE TO independently self manage needs related to chronic health conditions.   Knowledge Deficits related to short term plan for care coordination needs and long term plans for chronic disease management needs Nurse Case Manager Clinical Goal(s):  Over the next 90 days, patient will work with care management team to address care coordination and chronic disease management needs related to Disease Management  Educational Needs  Care Coordination  Medication Management and Education  Medication  Reconciliation  Medication Assistance    Interventions:  11/20/20 completed outbound call with patient   Reviewed all medications to determine if patient or caregiver knows why the medications are given and if taken as prescribed.   Completed and reviewed a medication adherence assessment including barriers to medication adherence.   Determined patient is working with the embedded Pharm D for PAP assistance for [name]  Determined patient will obtain samples from the PCP office until he received his mail order supply of [name] Patient Goals/Self Care Activities Over the next 90 days, patient will:  - work with embedded Pharm D for PAP assistance for [name] - obtain samples from PCP when supply is low if needed  - refill/obtain samples when supply is getting low but before running out of medication - take medication exactly as prescribed by PCP         Follow Up Plan: The patient has been provided with contact information for the care management team and has been advised to call with any health related questions or concerns.      Care Plan : Diabetes Type 2 (Adult)  Updates made by Lynne Logan, RN since 11/24/2020 12:00 AM    Problem: Disease Progression (Diabetes, Type 2)   Priority: High    Long-Range Goal: Disease Progression Prevented or Minimized   Start Date: 07/14/2020  Expected End Date: 07/14/2021  Recent Progress: On track  Priority: High  Note:   Objective:  Lab Results  Component Value Date   HGBA1C 7.0 (H) 06/29/2020 .   Lab Results  Component Value Date   CREATININE 1.21 06/29/2020   CREATININE 1.22 03/09/2020   CREATININE 1.30 (H) 11/07/2019 .   [name] No results found for: EGFR Current Barriers:  [name] Knowledge Deficits related to basic Diabetes pathophysiology and self care/management . Knowledge Deficits related to medications used for management of diabetes . Difficulty obtaining or cannot afford medications Case Manager Clinical Goal(s):  [name] Patient will  demonstrate improved adherence to prescribed treatment plan for diabetes self care/management as evidenced by:  . daily monitoring and recording of CBG  . adherence to ADA/ carb modified diet . exercise 3-5 days/week . adherence to prescribed medication regimen Interventions:  11/20/20 completed successful outbound call with patient  . Provided education to patient about basic DM disease process . Reviewed medications with patient and discussed importance of medication adherence . Review of patient status, including review of consultants reports, relevant laboratory and other test results, and medications completed. . Advised patient, providing education and rationale, to check cbg before meals and at bedtime, before and after exercise, when feeling BS is too high or too low and record, calling PCP for findings outside established parameters.   . Educated patient on dietary and exercise recommendations; daily glycemic control FBS 80-130, <180 after meals;15'15' rule . Mailed printed educational materials related to Diabetes Management  . Discussed plans with patient for ongoing care management follow up and provided patient with direct contact information for care management team Patient Self-Care Activities  - check blood sugar at prescribed times - check blood sugar before and after exercise - check blood sugar if I feel it is too high or too low - take the blood sugar log to all doctor visits - take  the blood sugar meter to all doctor visits Patient Goal:  - to lower A1c Follow Up Plan: Telephone follow up appointment with care management team member scheduled for: 02/22/21   Care Plan : General Plan of Care (Adult)  Updates made by Lynne Logan, RN since 11/24/2020 12:00 AM    Problem: Low HDL Cholesterol   Priority: Medium    Long-Range Goal: Raise HDL Cholesterol   Start Date: 11/20/2020  Expected End Date: 11/19/2021  This Visit's Progress: On track  Note:   Current Barriers:    Ineffective Self Health Maintenance  Clinical Goal(s):  [name] Collaboration with Glendale Chard, MD regarding development and update of comprehensive plan of care as evidenced by provider attestation and co-signature . Inter-disciplinary care team collaboration (see longitudinal plan of care)  patient will work with care management team to address care coordination and chronic disease management needs related to Disease Management  Educational Needs  Care Coordination  Medication Management and Education  Psychosocial Support   Interventions:   Evaluation of current treatment plan related to  low HDL Cholesterol , self-management and patient's adherence to plan as established by provider.  Collaboration with Glendale Chard, MD regarding development and update of comprehensive plan of care as evidenced by provider attestation       and co-signature  Inter-disciplinary care team collaboration (see longitudinal plan of care) . Provided education to patient about basic disease process . Review of patient status, including review of consultants reports, relevant laboratory and other test results, and medications completed. . Reviewed medications with patient and discussed importance of medication adherence . Educated patient on dietary recommendations to include healthy fats in his diet; Educated on exercise recommendations, 150 minutes weekly as tolerated . Mailed printed educational materials related to HDL Cholesterol  Discussed plans with patient for ongoing care management follow up and provided patient with direct contact information for care management team Self Care Activities:  . Continue to keep all scheduled follow up appointments . Take medications as directed  . Let your healthcare team know if you are unable to take your medications . Call your pharmacy for refills at least 7 days prior to running out of medication . Continue to adhere to dietary and exercise  recommendations  Patient Goals: - to raise HDL Cholesterol   Follow Up Plan: Telephone follow up appointment with care management team member scheduled for: 02/20/21    Plan:Telephone follow up appointment with care management team member scheduled for:  02/20/21  Barb Merino, RN, BSN, CCM Care Management Coordinator [place] [facility]  Direct Phone: [phone]

## 2020-11-24 NOTE — Progress Notes (Signed)
Pt is here for a b12 shot.

## 2020-11-24 NOTE — Patient Instructions (Signed)
Goals Addressed      Patient Stated   .  COMPLETED: "I am going to start walking again" (pt-stated)        Current Barriers:  [name] Knowledge Deficits related to diabetes disease management and Self health management  . Chronic Disease Management support and education needs related to DMII, CKD stage 3, Pure Hypercholesterolemia, Hypertensive Nephropathy  Nurse Case Manager Clinical Goal(s):  [name] Over the next 30 days, patient will verbalize basic understanding of diabetes disease process and self health management plan as evidenced by patient will verbalize increased knowledge and understanding of meal planning using the plate method and portion control  Goal Met . Over the next 30 days, patient will have a routine exercise regimen in place to include daily walking for 10 min or more per day. Goal Met . 04/18/19 New Over the next 30 days patient will verbalize better understanding about how walking and or exercising routinely can help lower his A1C and improve his overall stamina Goal Met  . 02/10/20 New Over the next 90 days, patient will work with the CCM team and PCP for disease education and support to improve and maintain Self Health management of DM  CCM RN CM Interventions: 02/10/20 completed call with patient  . Evaluation of current treatment plan related to diabetes and patient's adherence to plan as established by provider . Provided education to patient re: current A1C of 6.6 % obtained on 11/07/19; reviewed target A1C < 5.6% to fall within non-diabetic range; reiterated how implementing daily exercise can help lower blood sugars; discussed ADA recommendations to exercise 150 minutes per week; confirmed patient is walking daily  . Determined patient is engaged with embedded Dola Factor RPH . Determined patient is receiving medication assistance for Janument XR by [employer], reports adherence . Provided patient with printed educational materials related to [employer] with  Diabetes; Preventing Complications from Diabetes; Diabetes Care Schedule . Advised patient, providing education and rationale, to check cbg daily before meals and record, calling the CCM team and or PCP office for findings outside established parameters; FBS 80-130; <180 after meals  . Discussed plans with patient for ongoing care management follow up and provided patient with direct contact information for care management team  Patient Self Care Activities:  . Self administers medications as prescribed . Attends all scheduled provider appointments . Calls pharmacy for medication refills . Performs ADL's independently . Performs IADL's independently . Calls provider office for new concerns or questions  Please see past updates related to this goal by clicking on the "Past Updates" button in the selected goal   11/20/20 Please see new [employer] Care plan for updated goal.       Other   .  COMPLETED: Medication adherence with [name]        Timeframe:  Long-Range Goal Priority:  High Start Date:  07/14/20                           Expected End Date:  10/12/20  Follow up date: 09/08/20   Over the next 90 days, patient will:  - work with embedded Pharm D for PAP assistance for [name] - obtain samples from PCP when supply is low if needed  - refill/obtain samples when supply is getting low but before running out of medication - take medication exactly as prescribed by PCP                        .  Monitor and Manage My Blood Sugar-Diabetes Type 2   On track     Timeframe:  Long-Range Goal Priority:  High Start Date: 07/14/20                            Expected End Date: 07/14/21                 Follow Up Date: 02/22/21   - check blood sugar at prescribed times - check blood sugar before and after exercise - check blood sugar if I feel it is too high or too low - take the blood sugar log to all doctor visits - take the blood sugar meter to all doctor visits    Why is this important?     Checking your blood sugar at home helps to keep it from getting very high or very low.   Writing the results in a diary or log helps the doctor know how to care for you.   Your blood sugar log should have the time, date and the results.   Also, write down the amount of insulin or other medicine that you take.   Other information, like what you ate, exercise done and how you were feeling, will also be helpful.     Notes:     .  Raise HDL Cholesterol   On track     Timeframe:  Long-Range Goal Priority:  Medium Start Date:  11/20/20                           Expected End Date:  11/20/21      Next Scheduled Follow up date: 02/20/21  Self Care Activities:  . Continue to keep all scheduled follow up appointments . Take medications as directed  . Let your healthcare team know if you are unable to take your medications . Call your pharmacy for refills at least 7 days prior to running out of medication . Continue to adhere to dietary and exercise recommendations  Patient Goals: - to raise HDL Cholesterol

## 2020-12-01 ENCOUNTER — Telehealth: Payer: Self-pay

## 2020-12-01 ENCOUNTER — Other Ambulatory Visit: Payer: Self-pay

## 2020-12-01 ENCOUNTER — Ambulatory Visit (INDEPENDENT_AMBULATORY_CARE_PROVIDER_SITE_OTHER): Payer: Medicare HMO

## 2020-12-01 VITALS — BP 134/70 | HR 88 | Temp 97.9°F | Ht 71.4 in | Wt 234.0 lb

## 2020-12-01 DIAGNOSIS — E538 Deficiency of other specified B group vitamins: Secondary | ICD-10-CM

## 2020-12-01 MED ORDER — CYANOCOBALAMIN 1000 MCG/ML IJ SOLN
1000.0000 ug | Freq: Once | INTRAMUSCULAR | Status: AC
  Administered 2020-12-01: 1000 ug via INTRAMUSCULAR

## 2020-12-01 NOTE — Progress Notes (Signed)
Patient presents today for a vitamin B12 injection.

## 2020-12-01 NOTE — Chronic Care Management (AMB) (Signed)
No answer, left message of telephone appointment with [name] [facility] on 12-02-2020 at 11:00. Left message to have all medications, supplements, blood pressure and/or blood sugar logs available during appointment and to return call if need to reschedule.    [place] Pharmacist Assistant [phone]

## 2020-12-02 ENCOUNTER — Ambulatory Visit: Payer: Medicare HMO

## 2020-12-02 DIAGNOSIS — N182 Chronic kidney disease, stage 2 (mild): Secondary | ICD-10-CM

## 2020-12-02 DIAGNOSIS — I129 Hypertensive chronic kidney disease with stage 1 through stage 4 chronic kidney disease, or unspecified chronic kidney disease: Secondary | ICD-10-CM

## 2020-12-02 DIAGNOSIS — E7800 Pure hypercholesterolemia, unspecified: Secondary | ICD-10-CM

## 2020-12-02 DIAGNOSIS — E782 Mixed hyperlipidemia: Secondary | ICD-10-CM

## 2020-12-02 DIAGNOSIS — E1122 Type 2 diabetes mellitus with diabetic chronic kidney disease: Secondary | ICD-10-CM | POA: Diagnosis not present

## 2020-12-02 DIAGNOSIS — Z794 Long term (current) use of insulin: Secondary | ICD-10-CM | POA: Diagnosis not present

## 2020-12-02 NOTE — Progress Notes (Signed)
Chronic Care Management Pharmacy Note  12/02/2020 Name:  Andres [name] MRN:  [id number] DOB:  08/05/1950  Subjective: Andres [name] is an 70 y.o. year old male who is a primary patient of Glendale Chard, MD.  The CCM team was consulted for assistance with disease management and care coordination needs.    Engaged with patient by telephone for follow up visit in response to provider referral for pharmacy case management and/or care coordination services.   Consent to Services:  The patient was given information about Chronic Care Management services, agreed to services, and gave verbal consent prior to initiation of services.  Please see initial visit note for detailed documentation.   Patient Care Team: Glendale Chard, MD as PCP - General (Internal Medicine) Arnoldo Lenis, MD as PCP - Cardiology (Cardiology) Lynne Logan, RN as Case Manager Mayford Knife, [facility] (Pharmacist)  Recent office visits: 11/09/2020 PCP OV   Urgent care visits:  09/29/2020 Urgent Care visit    Objective:  Lab Results  Component Value Date   CREATININE 1.24 11/09/2020   BUN 13 11/09/2020   GFRNONAA 62 06/29/2020   GFRAA 71 06/29/2020   NA 139 11/09/2020   K 4.7 11/09/2020   CALCIUM 9.3 11/09/2020   CO2 25 11/09/2020   GLUCOSE 111 (H) 11/09/2020    Lab Results  Component Value Date/Time   HGBA1C 7.0 (H) 11/09/2020 12:22 PM   HGBA1C 7.0 (H) 06/29/2020 03:13 PM   HGBA1C 7.7 02/13/2018 12:00 AM   MICROALBUR 30 11/07/2019 11:30 AM   MICROALBUR 10 10/30/2018 10:29 AM    Last diabetic Eye exam:  Lab Results  Component Value Date/Time   HMDIABEYEEXA No Retinopathy 06/25/2020 12:00 AM    Last diabetic Foot exam: No results found for: HMDIABFOOTEX   Lab Results  Component Value Date   CHOL 102 11/09/2020   HDL 30 (L) 11/09/2020   LDLCALC 48 11/09/2020   TRIG 132 11/09/2020   CHOLHDL 3.4 11/09/2020    Hepatic Function Latest Ref Rng & Units 11/09/2020 06/29/2020 11/07/2019   Total Protein 6.0 - 8.5 g/dL 7.4 7.8 8.0  Albumin 3.8 - 4.8 g/dL 4.5 4.6 4.5  AST 0 - 40 IU/L 20 20 24   ALT 0 - 44 IU/L 19 29 21   Alk Phosphatase 44 - 121 IU/L 57 64 58  Total Bilirubin 0.0 - 1.2 mg/dL 0.3 0.3 0.5  Bilirubin, Direct 0.00 - 0.40 mg/dL - - -    Lab Results  Component Value Date/Time   TSH 2.260 11/09/2020 12:22 PM   TSH 2.410 06/29/2020 03:13 PM    CBC Latest Ref Rng & Units 11/09/2020 11/07/2019 10/30/2018  WBC 3.4 - 10.8 x10E3/uL 7.7 8.2 7.4  Hemoglobin 13.0 - 17.7 g/dL 13.3 13.7 13.6  Hematocrit 37.5 - 51.0 % 39.7 41.1 40.0  Platelets 150 - 450 x10E3/uL 216 222 199    No results found for: VD25OH  Clinical ASCVD: No  The ASCVD Risk score Mikey Bussing DC Jr., et al., 2013) failed to calculate for the following reasons:   The valid total cholesterol range is 130 to 320 mg/dL    Depression screen [facility] 2/9 11/12/2020 11/09/2020 11/07/2019  Decreased Interest 0 0 0  Down, Depressed, Hopeless 0 0 0  PHQ - 2 Score 0 0 0  Altered sleeping - - 0  Tired, decreased energy - - 0  Change in appetite - - 0  Feeling bad or failure about yourself  - - 0  Trouble concentrating - -  0  Moving slowly or fidgety/restless - - 0  Suicidal thoughts - - 0  PHQ-9 Score - - 0  Difficult doing work/chores - - Not difficult at all      Social History   Tobacco Use  Smoking Status Former Smoker  . Packs/day: 0.50  . Years: 22.00  . Pack years: 11.00  . Types: Cigarettes  . Quit date: 07/11/2011  . Years since quitting: 9.4  Smokeless Tobacco Never Used  Tobacco Comment   he has quit   BP Readings from Last 3 Encounters:  12/01/20 134/70  11/24/20 128/80  11/17/20 124/78   Pulse Readings from Last 3 Encounters:  12/01/20 88  11/24/20 85  11/09/20 86   Wt Readings from Last 3 Encounters:  12/01/20 234 lb (106.1 kg)  11/24/20 232 lb 3.2 oz (105.3 kg)  11/17/20 235 lb 12.8 oz (107 kg)   BMI Readings from Last 3 Encounters:  12/01/20 32.27 kg/m  11/24/20 32.39 kg/m   11/17/20 32.89 kg/m    Assessment/Interventions: Review of patient past medical history, allergies, medications, health status, including review of consultants reports, laboratory and other test data, was performed as part of comprehensive evaluation and provision of chronic care management services.   SDOH:  (Social Determinants of Health) assessments and interventions performed: Yes  SDOH Screenings   Alcohol Screen: Not on file  Depression (PHQ2-9): Low Risk   . PHQ-2 Score: 0  Financial Resource Strain: Low Risk   . Difficulty of Paying Living Expenses: Not hard at all  Food Insecurity: No Food Insecurity  . Worried About Charity fundraiser in the Last Year: Never true  . Ran Out of Food in the Last Year: Never true  Housing: Not on file  Physical Activity: Sufficiently Active  . Days of Exercise per Week: 3 days  . Minutes of Exercise per Session: 60 min  Social Connections: Not on file  Stress: No Stress Concern Present  . Feeling of Stress : Not at all  Tobacco Use: Medium Risk  . Smoking Tobacco Use: Former Smoker  . Smokeless Tobacco Use: Never Used  Transportation Needs: No Transportation Needs  . Lack of Transportation (Medical): No  . Lack of Transportation (Non-Medical): No    CCM Care Plan  No Known Allergies  Medications Reviewed Today    Reviewed by Mayford Knife, RPH (Pharmacist) on 12/02/20 at 1154  Med List Status: <None>  Medication Order Taking? Sig Documenting Provider Last Dose Status Informant  Accu-Chek FastClix Lancets MISC [id number] Yes One tab po qd dx; e11.65 Glendale Chard, MD Taking Active   ACCU-CHEK GUIDE test strip [id number] Yes Use to check BS qd dx: E11.65 Glendale Chard, MD Taking Active   Arginine 500 MG CAPS [id number] Yes Take by mouth. Daily [provider] Taking Active Self  aspirin 81 MG tablet [id number] Yes Take 81 mg by mouth daily. [provider] Taking Active   cholecalciferol (VITAMIN D) 1000 UNITS  tablet [id number] Yes Take 4,000 Units by mouth daily. [provider] Taking Active   cyanocobalamin ((VITAMIN B-12)) injection 1,000 mcg [id number]   Glendale Chard, MD  Active   Cyanocobalamin (VITAMIN B 12 PO) [id number] No Take 1 capsule by mouth. 2058mg daily  Patient not taking: Reported on 12/02/2020   [provider] Not Taking Active Self           Med Note (Jeoffrey MassedMay 25, 2022 11:46 AM) Patient not taking at this time  until he completes his Vitamin B 12 injection series.  fluticasone (CUTIVATE) 0.05 % cream [id number] Yes Apply topically 2 (two) times daily. Glendale Chard, MD Taking Active   [name] XR 50-1000 MG TB24 [id number] Yes TAKE ONE TABLET BY MOUTH TWICE DAILY Glendale Chard, MD Taking Active   levocetirizine (XYZAL) 5 MG tablet [id number] Yes Take 1 tablet (5 mg total) by mouth every evening. Glendale Chard, MD Taking Active   Multiple Vitamin (MULTIVITAMIN) tablet [id number] Yes Take 1 tablet by mouth daily. [provider] Taking Active   olmesartan-hydrochlorothiazide (BENICAR HCT) 20-12.5 MG tablet [id number] Yes Take 1 tablet by mouth daily. Glendale Chard, MD Taking Active   rosuvastatin (CRESTOR) 10 MG tablet [id number] Yes One tab po qd Glendale Chard, MD Taking Active   tadalafil (CIALIS) 5 MG tablet [id number] Yes TAKE 1 TABLET BY MOUTH ONCE A DAY. Glendale Chard, MD Taking Active           Patient Active Problem List   Diagnosis Date Noted  . Paresthesias 11/22/2020  . Benign prostatic hyperplasia with urinary frequency 11/22/2020  . Class 1 obesity due to excess calories with serious comorbidity and body mass index (BMI) of 33.0 to 33.9 in adult 11/22/2020  . Type 2 diabetes mellitus with stage 2 chronic kidney disease, with long-term current use of insulin ([place]) 07/23/2018  . Pure hypercholesterolemia 07/23/2018  . HTN (hypertension) 07/23/2018  . Hypertensive nephropathy 02/13/2018    Immunization History   Administered Date(s) Administered  . Fluad Quad(high Dose 65+) 05/08/2020  . Influenza, High Dose Seasonal PF 04/11/2018, 04/01/2019  . Moderna SARS-COV2 Booster Vaccination 05/22/2020  . Moderna Sars-Covid-2 Vaccination 08/17/2019, 09/17/2019  . Pneumococcal Polysaccharide-23 07/23/2018  . Tdap 04/17/2019    Conditions to be addressed/monitored:  Hypertension and Diabetes  Care Plan : [place]  Updates made by Mayford Knife, RPH since 12/02/2020 12:00 AM    Problem: HTN, DM II, HLD   Priority: High  Onset Date: 09/29/2020    Long-Range Goal: Disease State Management   Start Date: 09/29/2020  Recent Progress: On track  Priority: High  Note:    Current Barriers:  . Unable to independently afford treatment regimen . Does not maintain contact with provider office . Does not contact provider office for questions/concerns   Pharmacist Clinical Goal(s):  [name] Patient will verbalize ability to afford treatment regimen . maintain control of diabetes  as evidenced by A1c.  through collaboration with PharmD and provider.    Interventions: . 1:1 collaboration with Glendale Chard, MD regarding development and update of comprehensive plan of care as evidenced by provider attestation and co-signature . Inter-disciplinary care team collaboration (see longitudinal plan of care) . Comprehensive medication review performed; medication list updated in electronic medical record  Hypertension (BP goal <130/80) -Controlled -Current treatment: . Olmesartan -Hydrochlorothiazide 20-12.5 mg taking 1 tablet by mouth daily  -Current home readings: patient reports that he is checking his BP at home but does not have his log with him.  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 He is walking for at least 10-20 laps.  -Denies hypotensive/hypertensive symptoms -Educated on Exercise goal of 150 minutes per  week; Importance of home blood pressure monitoring; Proper BP monitoring technique; -Counseled to monitor BP at home at least three times per week, document, and provide log at future appointments -Counseled on diet and exercise extensively Recommended to continue current medication  Hyperlipidemia: (  LDL goal < 70) -Uncontrolled -Current treatment: . Rosuvastatin 10 mg tablet once per day o Patient reports that he is taking one tablet 6 days per week.  o Patient reports he has plenty of medication  -Current dietary patterns: patient reports that he is eating well, not eating a lot junk food. He is not eating as much red meat. He is eating more fish and chicken and vegetables.  -Educated on Cholesterol goals;  Benefits of statin for ASCVD risk reduction; Importance of limiting foods high in cholesterol; Exercise goal of 150 minutes per week; Strategies to manage statin-induced myalgias; -Recommended to continue current medication  Diabetes (A1c goal <7%) -Not ideally controlled -Current medications: . [name] XR 50-1000 mg tablet- taking one tablet by mouth twice daily, in the morning and at bedtime.  o Patients medication is mailed to him every 90 days  -Current home glucose readings - patient reports that he checked his  . Fasting glucose: 112 . post prandial glucose: 130,121 -Denies hypoglycemic/hyperglycemic symptoms -Current meal patterns:  . Breakfast: varies - sometimes cereal, or egg with sausage   . lunch: is not eating as much at lunch  . dinner: baked grouper, with vegetables and maybe a sweet potato, sometimes fries  . snacks:  [name] Drinks:patient is drinking plenty of water.  -Current exercise: patient reports that he was going to start exercising again but he has not started the walking as much, he reports that he has not started walking yet.  -Patient is going to discuss with insurance the cost of CGM and the benefits. - per patients was informed that CGM would have a  high cost associated it with it, at this time he would like to continue checking his glucose once a day.  -Will follow up with [employer] to confirm patients medication will be sent to him via refill  -Educated on Prevention and management of hypoglycemic episodes; -Counseled to check feet daily and get yearly eye exams -Recommended to continue current medication   - take medications as prescribed collaborate with provider on medication access solutions  Follow Up Plan: Telephone follow up appointment with care management team member scheduled for: The patient has been provided with contact information for the care management team and has been advised to call with any health related questions or concerns.   Follow Up Plan: Telephone follow up appointment with care management team member scheduled for: The patient has been provided with contact information for the care management team and has been advised to call with any health related questions or concerns.  Patients Next Appointment on 03/02/2021.       Medication Assistance: Janumetobtained through [employer]  medication assistance program.  Enrollment ends 06/2021.  Compliance/Adherence/Medication fill history: Care Gaps: None at this moment   Star-Rating Drugs: [name] XR 50 - 1000 mg - received through patient assistance  Olmesartan - HCTZ 20-12.5 mg - filled at [facility]   Patient's preferred pharmacy is:  [facility], [address] [place] [address] Phone: [phone] Fax: [phone]  [place], [address] [place] [address] Phone: [phone] Fax: [phone]  Uses pill box? Yes Pt endorses 85% compliance  We discussed: Benefits of medication synchronization, packaging and delivery as well as enhanced pharmacist oversight with [employer]. Patient decided to: Continue current medication management strategy  Care Plan and  Follow Up Patient Decision:  Patient agrees to Care Plan and Follow-up.  Plan:  The patient has been provided with contact information for the care management team and has been advised to call with any health related questions or concerns.   [name], PharmD Clinical Pharmacist [facility] [phone]

## 2020-12-02 NOTE — Patient Instructions (Addendum)
Visit Information It was great speaking with you today!  Please let me know if you have any questions about our visit.  Goals Addressed            This Visit's Progress   . Manage My Medicine       Timeframe:  Long-Range Goal Priority:  High Start Date:                             Expected End Date:                       Follow Up Date 03/02/2021   - call for medicine refill 2 or 3 days before it runs out - call if I am sick and can't take my medicine - keep a list of all the medicines I take; vitamins and herbals too - learn to read medicine labels - use an alarm clock or phone to remind me to take my medicine    Why is this important?   . These steps will help you keep on track with your medicines.       . COMPLETED: Perform Foot Care-Diabetes Type 2       Timeframe:  Long-Range Goal Priority:  High Start Date:   09/29/2020                           Expected End Date:                       Follow Up Date 12/02/2020    - check feet daily for cuts, sores or redness - keep feet up while sitting - trim toenails straight across - wash and dry feet carefully every day - wear comfortable, cotton socks - wear comfortable, well-fitting shoes    Why is this important?    Good foot care is very important when you have diabetes.   There are many things you can do to keep your feet healthy and catch a problem early.    Notes:  Patient to be seen at urgent care for toe injury and pain at [facility] in [place]        Current Barriers:  . Unable to independently afford treatment regimen . Does not maintain contact with provider office . Does not contact provider office for questions/concerns   Pharmacist Clinical Goal(s):  [name] Patient will verbalize ability to afford treatment regimen . maintain control of diabetes  as evidenced by A1c.  through collaboration with PharmD and provider.    Interventions: . 1:1 collaboration with Glendale Chard, MD regarding  development and update of comprehensive plan of care as evidenced by provider attestation and co-signature . Inter-disciplinary care team collaboration (see longitudinal plan of care) . Comprehensive medication review performed; medication list updated in electronic medical record  Hypertension (BP goal <130/80) -Controlled -Current treatment: . Olmesartan -Hydrochlorothiazide 20-12.5 mg taking 1 tablet by mouth daily  -Current home readings: patient reports that he is checking his BP at home but does not have his log with him.  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 He is walking for at least 10-20 laps.  -Denies hypotensive/hypertensive symptoms -Educated on Exercise goal of 150 minutes per week; Importance of home blood pressure monitoring; Proper BP monitoring technique; -Counseled to monitor BP  at home at least three times per week, document, and provide log at future appointments -Counseled on diet and exercise extensively Recommended to continue current medication  Hyperlipidemia: (LDL goal < 70) -Uncontrolled -Current treatment: . Rosuvastatin 10 mg tablet once per day o Patient reports that he is taking one tablet 6 days per week.  o Patient reports he has plenty of medication  -Current dietary patterns: patient reports that he is eating well, not eating a lot junk food. He is not eating as much red meat. He is eating more fish and chicken and vegetables.  -Educated on Cholesterol goals;  Benefits of statin for ASCVD risk reduction; Importance of limiting foods high in cholesterol; Exercise goal of 150 minutes per week; Strategies to manage statin-induced myalgias; -Recommended to continue current medication  Diabetes (A1c goal <7%) -Not ideally controlled -Current medications: . [name] XR 50-1000 mg tablet- taking one tablet by mouth twice daily, in the morning and at bedtime.  o Patients  medication is mailed to him every 90 days  -Current home glucose readings - patient reports that he checked his  . Fasting glucose: 112 . post prandial glucose: 130,121 -Denies hypoglycemic/hyperglycemic symptoms -Current meal patterns:  . Breakfast: varies - sometimes cereal, or egg with sausage   . lunch: is not eating as much at lunch  . dinner: baked grouper, with vegetables and maybe a sweet potato, sometimes fries  . snacks:  [name] Drinks:patient is drinking plenty of water.  -Current exercise: patient reports that he was going to start exercising again but he has not started the walking as much, he reports that he has not started walking yet.  -Patient is going to discuss with insurance the cost of CGM and the benefits. - per patients was informed that CGM would have a high cost associated it with it, at this time he would like to continue checking his glucose once a day.  -Will follow up with [employer] to confirm patients medication will be sent to him via refill  -Educated on Prevention and management of hypoglycemic episodes; -Counseled to check feet daily and get yearly eye exams -Recommended to continue current medication   - take medications as prescribed collaborate with provider on medication access solutions  Follow Up Plan: Telephone follow up appointment with care management team member scheduled for: The patient has been provided with contact information for the care management team and has been advised to call with any health related questions or concerns.   Follow Up Plan: Telephone follow up appointment with care management team member scheduled for: The patient has been provided with contact information for the care management team and has been advised to call with any health related questions or concerns.  Patients Next Appointment on 03/02/2021.     Patient agreed to services and verbal consent obtained.   The patient verbalized understanding of instructions,  educational materials, and care plan provided today and agreed to receive a mailed copy of patient instructions, educational materials, and care plan.   [name], PharmD Clinical Pharmacist [facility] [phone]

## 2020-12-08 ENCOUNTER — Ambulatory Visit (INDEPENDENT_AMBULATORY_CARE_PROVIDER_SITE_OTHER): Payer: Medicare HMO

## 2020-12-08 ENCOUNTER — Other Ambulatory Visit: Payer: Self-pay

## 2020-12-08 VITALS — BP 134/72 | HR 76 | Temp 98.0°F | Ht 71.4 in | Wt 234.4 lb

## 2020-12-08 DIAGNOSIS — E538 Deficiency of other specified B group vitamins: Secondary | ICD-10-CM

## 2020-12-08 MED ORDER — CYANOCOBALAMIN 1000 MCG/ML IJ SOLN
1000.0000 ug | Freq: Once | INTRAMUSCULAR | Status: AC
  Administered 2020-12-08: 1000 ug via INTRAMUSCULAR

## 2020-12-08 NOTE — Progress Notes (Signed)
Patient here for b12

## 2020-12-17 ENCOUNTER — Telehealth: Payer: Self-pay

## 2020-12-17 NOTE — Telephone Encounter (Signed)
.  Chronic Care Management Pharmacy Note  12/17/2020 Name:  Andres [name] MRN:  [id number] DOB:  07/27/1950   Subjective: Andres [name] is an 70 y.o. year old male who is a primary patient of Glendale Chard, MD.  The CCM team was consulted for assistance with disease management and care coordination needs.    Engaged with patient by telephone for follow up visit in response to provider referral for pharmacy case management and/or care coordination services.   Consent to Services:  The patient was given information about Chronic Care Management services, agreed to services, and gave verbal consent prior to initiation of services.  Please see initial visit note for detailed documentation.   Patient Care Team: Glendale Chard, MD as PCP - General (Internal Medicine) Arnoldo Lenis, MD as PCP - Cardiology (Cardiology) Lynne Logan, RN as Case Manager Mayford Knife, [facility] (Pharmacist)  Recent office visits: 11/09/2020 PCP OV   Urgent care visits:  09/29/2020 Urgent Care visit    Objective:  Lab Results  Component Value Date   CREATININE 1.24 11/09/2020   BUN 13 11/09/2020   GFRNONAA 62 06/29/2020   GFRAA 71 06/29/2020   NA 139 11/09/2020   K 4.7 11/09/2020   CALCIUM 9.3 11/09/2020   CO2 25 11/09/2020   GLUCOSE 111 (H) 11/09/2020    Lab Results  Component Value Date/Time   HGBA1C 7.0 (H) 11/09/2020 12:22 PM   HGBA1C 7.0 (H) 06/29/2020 03:13 PM   HGBA1C 7.7 02/13/2018 12:00 AM   MICROALBUR 30 11/07/2019 11:30 AM   MICROALBUR 10 10/30/2018 10:29 AM    Last diabetic Eye exam:  Lab Results  Component Value Date/Time   HMDIABEYEEXA No Retinopathy 06/25/2020 12:00 AM    Last diabetic Foot exam: No results found for: HMDIABFOOTEX   Lab Results  Component Value Date   CHOL 102 11/09/2020   HDL 30 (L) 11/09/2020   LDLCALC 48 11/09/2020   TRIG 132 11/09/2020   CHOLHDL 3.4 11/09/2020    Hepatic Function Latest Ref Rng & Units 11/09/2020 06/29/2020 11/07/2019   Total Protein 6.0 - 8.5 g/dL 7.4 7.8 8.0  Albumin 3.8 - 4.8 g/dL 4.5 4.6 4.5  AST 0 - 40 IU/L _0 ALT 0 - 44 IU/L _1 Alk Phosphatase 44 - 121 IU/L 57 64 58  Total Bilirubin 0.0 - 1.2 mg/dL 0.3 0.3 0.5  Bilirubin, Direct 0.00 - 0.40 mg/dL - - -    Lab Results  Component Value Date/Time   TSH 2.260 11/09/2020 12:22 PM   TSH 2.410 06/29/2020 03:13 PM    CBC Latest Ref Rng & Units 11/09/2020 11/07/2019 10/30/2018  WBC 3.4 - 10.8 x10E3/uL 7.7 8.2 7.4  Hemoglobin 13.0 - 17.7 g/dL 13.3 13.7 13.6  Hematocrit 37.5 - 51.0 % 39.7 41.1 40.0  Platelets 150 - 450 x10E3/uL 216 222 199    No results found for: VD25OH  Clinical ASCVD: No  The ASCVD Risk score Mikey Bussing DC Jr., et al., 2013) failed to calculate for the following reasons:   The valid total cholesterol range is 130 to 320 mg/dL    Depression screen [facility] 2/9 11/12/2020 11/09/2020 11/07/2019  Decreased Interest 0 0 0  Down, Depressed, Hopeless 0 0 0  PHQ - 2 Score 0 0 0  Altered sleeping - - 0  Tired, decreased energy - - 0  Change in appetite - - 0  Feeling bad or failure about yourself  - - 0  Trouble  concentrating - - 0  Moving slowly or fidgety/restless - - 0  Suicidal thoughts - - 0  PHQ-9 Score - - 0  Difficult doing work/chores - - Not difficult at all      Social History   Tobacco Use  Smoking Status Former   Packs/day: 0.50   Years: 22.00   Pack years: 11.00   Types: Cigarettes   Quit date: 07/11/2011   Years since quitting: 9.4  Smokeless Tobacco Never  Tobacco Comments   he has quit   BP Readings from Last 3 Encounters:  12/08/20 134/72  12/01/20 134/70  11/24/20 128/80   Pulse Readings from Last 3 Encounters:  12/08/20 76  12/01/20 88  11/24/20 85   Wt Readings from Last 3 Encounters:  12/08/20 234 lb 6.4 oz (106.3 kg)  12/01/20 234 lb (106.1 kg)  11/24/20 232 lb 3.2 oz (105.3 kg)   BMI Readings from Last 3 Encounters:  12/08/20 32.33 kg/m  12/01/20 32.27 kg/m  11/24/20 32.39 kg/m     Assessment/Interventions: Review of patient past medical history, allergies, medications, health status, including review of consultants reports, laboratory and other test data, was performed as part of comprehensive evaluation and provision of chronic care management services.   SDOH:  (Social Determinants of Health) assessments and interventions performed: Yes  SDOH Screenings   Alcohol Screen: Not on file  Depression (PHQ2-9): Low Risk    PHQ-2 Score: 0  Financial Resource Strain: Low Risk    Difficulty of Paying Living Expenses: Not hard at all  Food Insecurity: No Food Insecurity   Worried About Charity fundraiser in the Last Year: Never true   Ran Out of Food in the Last Year: Never true  Housing: Not on file  Physical Activity: Sufficiently Active   Days of Exercise per Week: 3 days   Minutes of Exercise per Session: 60 min  Social Connections: Not on file  Stress: No Stress Concern Present   Feeling of Stress : Not at all  Tobacco Use: Medium Risk   Smoking Tobacco Use: Former   Smokeless Tobacco Use: Never  Transportation Needs: No Transportation Needs   Lack of Transportation (Medical): No   Lack of Transportation (Non-Medical): No    CCM Care Plan  No Known Allergies  Medications Reviewed Today     Reviewed by Mayford Knife, RPH (Pharmacist) on 12/02/20 at 1154  Med List Status: <None>   Medication Order Taking? Sig Documenting Provider Last Dose Status Informant  Accu-Chek FastClix Lancets MISC [id number] Yes One tab po qd dx; e11.65 Glendale Chard, MD Taking Active   ACCU-CHEK GUIDE test strip [id number] Yes Use to check BS qd dx: E11.65 Glendale Chard, MD Taking Active   Arginine 500 MG CAPS [id number] Yes Take by mouth. Daily [provider] Taking Active Self  aspirin 81 MG tablet [id number] Yes Take 81 mg by mouth daily. [provider] Taking Active   cholecalciferol (VITAMIN D) 1000 UNITS tablet [id number] Yes Take 4,000 Units by  mouth daily. [provider] Taking Active   cyanocobalamin ((VITAMIN B-12)) injection 1,000 mcg [id number]   Glendale Chard, MD  Active   Cyanocobalamin (VITAMIN B 12 PO) [id number] No Take 1 capsule by mouth. 2049mg daily  Patient not taking: Reported on 12/02/2020   [provider] Not Taking Active Self           Med Note (Jeoffrey MassedMay 25, 2022 11:46 AM) Patient not taking at this  time until he completes his Vitamin B 12 injection series.  fluticasone (CUTIVATE) 0.05 % cream [id number] Yes Apply topically 2 (two) times daily. Glendale Chard, MD Taking Active   [name] XR 50-1000 MG TB24 [id number] Yes TAKE ONE TABLET BY MOUTH TWICE DAILY Glendale Chard, MD Taking Active   levocetirizine (XYZAL) 5 MG tablet [id number] Yes Take 1 tablet (5 mg total) by mouth every evening. Glendale Chard, MD Taking Active   Multiple Vitamin (MULTIVITAMIN) tablet [id number] Yes Take 1 tablet by mouth daily. [provider] Taking Active   olmesartan-hydrochlorothiazide (BENICAR HCT) 20-12.5 MG tablet [id number] Yes Take 1 tablet by mouth daily. Glendale Chard, MD Taking Active   rosuvastatin (CRESTOR) 10 MG tablet [id number] Yes One tab po qd Glendale Chard, MD Taking Active   tadalafil (CIALIS) 5 MG tablet [id number] Yes TAKE 1 TABLET BY MOUTH ONCE A DAY. Glendale Chard, MD Taking Active             Patient Active Problem List   Diagnosis Date Noted   Paresthesias 11/22/2020   Benign prostatic hyperplasia with urinary frequency 11/22/2020   Class 1 obesity due to excess calories with serious comorbidity and body mass index (BMI) of 33.0 to 33.9 in adult 11/22/2020   Type 2 diabetes mellitus with stage 2 chronic kidney disease, with long-term current use of insulin ([place]) 07/23/2018   Pure hypercholesterolemia 07/23/2018   HTN (hypertension) 07/23/2018   Hypertensive nephropathy 02/13/2018    Immunization History  Administered Date(s) Administered   Fluad Quad(high  Dose 65+) 05/08/2020   Influenza, High Dose Seasonal PF 04/11/2018, 04/01/2019   Moderna SARS-COV2 Booster Vaccination 05/22/2020   Moderna Sars-Covid-2 Vaccination 08/17/2019, 09/17/2019   Pneumococcal Polysaccharide-23 07/23/2018   Tdap 04/17/2019    Conditions to be addressed/monitored:  Hypertension and Diabetes  Problem: HTN, DM II, HLD   Priority: High  Onset Date: 09/29/2020     Long-Range Goal: Disease State Management   Start Date: 09/29/2020  Recent Progress: On track  Priority: High  Note:    Current Barriers:  Unable to independently afford treatment regimen Does not maintain contact with provider office Does not contact provider office for questions/concerns   Pharmacist Clinical Goal(s):  Patient will verbalize ability to afford treatment regimen maintain control of diabetes  as evidenced by A1c.  through collaboration with PharmD and provider.    Interventions: 1:1 collaboration with Glendale Chard, MD regarding development and update of comprehensive plan of care as evidenced by provider attestation and co-signature Inter-disciplinary care team collaboration (see longitudinal plan of care) Comprehensive medication review performed; medication list updated in electronic medical record  Hypertension (BP goal <130/80) -Controlled -Current treatment: Olmesartan -Hydrochlorothiazide 20-12.5 mg taking 1 tablet by mouth daily  -Current home readings: patient reports that he is checking his BP at home but does not have his log with him.  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 He is walking for at least 10-20 laps.  -Denies hypotensive/hypertensive symptoms -Educated on Exercise goal of 150 minutes per week; Importance of home blood pressure monitoring; Proper BP monitoring technique; -Counseled to monitor BP at home at least three times per week, document, and provide log at future  appointments -Counseled on diet and exercise extensively Recommended to continue current medication  Hyperlipidemia: (LDL goal < 70) -Uncontrolled -Current treatment: Rosuvastatin 10 mg tablet once per day Patient reports that he is taking one tablet 6 days per week.  Patient reports he has plenty of medication  -Current dietary patterns: patient reports that he is eating well, not eating a lot junk food. He is not eating as much red meat. He is eating more fish and chicken and vegetables.  -Educated on Cholesterol goals;  Benefits of statin for ASCVD risk reduction; Importance of limiting foods high in cholesterol; Exercise goal of 150 minutes per week; Strategies to manage statin-induced myalgias; -Recommended to continue current medication  Diabetes (A1c goal <7%) -Not ideally controlled -Current medications: [name] XR 50-1000 mg tablet- taking one tablet by mouth twice daily, in the morning and at bedtime.  Patients medication is mailed to him every 90 days  -Current home glucose readings - patient reports that he checked his  Fasting glucose: 112 post prandial glucose: 130,121 -Denies hypoglycemic/hyperglycemic symptoms -Current meal patterns:  Breakfast: varies - sometimes cereal, or egg with sausage   lunch: is not eating as much at lunch  dinner: baked grouper, with vegetables and maybe a sweet potato, sometimes fries  Drinks:patient is drinking plenty of water.  -Current exercise: patient reports that he was going to start exercising again but he has not started the walking as much, he reports that he has not started walking yet.  -Patient is going to discuss with insurance the cost of CGM and the benefits. - per patients was informed that CGM would have a high cost associated it with it, at this time he would like to continue checking his glucose once a day.  -Will follow up with [employer] to confirm patients medication will be sent to him via refill  -Educated on Prevention  and management of hypoglycemic episodes; 12/17/2020 Updates:  -Patient called this morning stating he only has 6 tablets of [name] left and was uncertain of why his order has not shipped since he was approved for medication assistance. I told the patient I would follow up with [employer] to determine next steps and contact him with more information.  -Spoke with representative, Angie at [employer] who reports that patient is due for a refill and she is uncertain of why the order did not go through. I was transferred to the pharmacy department and spoke with representative who stated the patient did not have any more refills. Upon further review the representative found that the patient did have refills on the prescription.  -Spoke with representative: [employer] I requested the medication shipment be expedited and the representative reported that the medication will be shipped out today and it should be with him tomorrow.  -I will remind the patient to call [phone] at least two weeks prior to him running out of medication.  -Counseled to check feet daily and get yearly eye exams -Recommended to continue current medication - take medications as prescribed collaborate with provider on medication access solutions  Follow Up Plan: Telephone follow up appointment with care management team member scheduled for: The patient has been provided with contact information for the care management team and has been advised to call with any health related questions or concerns.   Follow Up Plan: Telephone follow up appointment with care management team member scheduled for: The patient has been provided with contact information for the care management team and has been advised to call with any health related questions or concerns.  Patients Next Appointment on 03/02/2021.       Medication Assistance:  [name] obtained through [employer]  medication assistance program.  Enrollment ends  07/10/2021.  Compliance/Adherence/Medication fill history: Care Gaps: None at  this moment   Star-Rating Drugs: [name] XR 50 - 1000 mg - received through patient assistance  Olmesartan - HCTZ 20-12.5 mg - [facility]  Rosuvastatin 10 mg tablet once per day   Patient's preferred pharmacy is:  [facility], [place] [address] Phone: [phone] Fax: [phone]  [place] [address] [place] [address] [place] [address] Phone: [phone] Fax: [phone]  Uses pill box? Yes Pt endorses 85% compliance  We discussed: Benefits of medication synchronization, packaging and delivery as well as enhanced pharmacist oversight with [employer]. Patient decided to: Continue current medication management strategy  Care Plan and Follow Up Patient Decision:  Patient agrees to Care Plan and Follow-up.  Plan: The patient has been provided with contact information for the care management team and has been advised to call with any health related questions or concerns.   [name], PharmD Clinical Pharmacist [facility] [phone]

## 2020-12-21 ENCOUNTER — Telehealth: Payer: Medicare HMO

## 2020-12-22 ENCOUNTER — Telehealth: Payer: Self-pay

## 2020-12-22 NOTE — Chronic Care Management (AMB) (Signed)
    Chronic Care Management Pharmacy Assistant   Name: Andres [name]  MRN: [id number] DOB: 03-10-1951  Reason for Encounter: Disease State/ Diabetes  Recent office visits:  None  Recent consult visits:  None  Hospital visits:  None in previous 6 months  Medications: Outpatient Encounter Medications as of 12/22/2020  Medication Sig Note   Accu-Chek FastClix Lancets MISC One tab po qd dx; e11.65    ACCU-CHEK GUIDE test strip Use to check BS qd dx: E11.65    Arginine 500 MG CAPS Take by mouth. Daily    aspirin 81 MG tablet Take 81 mg by mouth daily.    cholecalciferol (VITAMIN D) 1000 UNITS tablet Take 4,000 Units by mouth daily.    Cyanocobalamin (VITAMIN B 12 PO) Take 1 capsule by mouth. 2070mcg daily (Patient not taking: Reported on 12/02/2020) 12/02/2020: Patient not taking at this time until he completes his Vitamin B 12 injection series.   fluticasone (CUTIVATE) 0.05 % cream Apply topically 2 (two) times daily.    [name] XR 50-1000 MG TB24 TAKE ONE TABLET BY MOUTH TWICE DAILY    levocetirizine (XYZAL) 5 MG tablet Take 1 tablet (5 mg total) by mouth every evening.    Multiple Vitamin (MULTIVITAMIN) tablet Take 1 tablet by mouth daily.    olmesartan-hydrochlorothiazide (BENICAR HCT) 20-12.5 MG tablet Take 1 tablet by mouth daily.    rosuvastatin (CRESTOR) 10 MG tablet One tab po qd    tadalafil (CIALIS) 5 MG tablet TAKE 1 TABLET BY MOUTH ONCE A DAY.    Facility-Administered Encounter Medications as of 12/22/2020  Medication   cyanocobalamin ((VITAMIN B-12)) injection 1,000 mcg    12-22-2020: 1st attempt. Voicemail not available. Work number invalid. 12-29-2020: 2nd attempt. VM full 01-04-2021: 3rd attempt. VM full  Star Rating Drugs: [name] XR 50 - 1000 mg - received through patient assistance Olmesartan - HCTZ 20-12.5 mg - Last filled 10-26-2020 [facility]  Rosuvastatin 10 mg- Last filled 11-09-2020 [facility]  Pharmacist Assistant [phone]

## 2021-01-18 DIAGNOSIS — Z125 Encounter for screening for malignant neoplasm of prostate: Secondary | ICD-10-CM | POA: Diagnosis not present

## 2021-01-25 DIAGNOSIS — N5201 Erectile dysfunction due to arterial insufficiency: Secondary | ICD-10-CM | POA: Diagnosis not present

## 2021-01-25 DIAGNOSIS — N3943 Post-void dribbling: Secondary | ICD-10-CM | POA: Diagnosis not present

## 2021-01-25 DIAGNOSIS — N401 Enlarged prostate with lower urinary tract symptoms: Secondary | ICD-10-CM | POA: Diagnosis not present

## 2021-02-22 ENCOUNTER — Ambulatory Visit (INDEPENDENT_AMBULATORY_CARE_PROVIDER_SITE_OTHER): Payer: Medicare HMO

## 2021-02-22 ENCOUNTER — Telehealth: Payer: Medicare HMO

## 2021-02-22 ENCOUNTER — Telehealth: Payer: Self-pay

## 2021-02-22 DIAGNOSIS — Z794 Long term (current) use of insulin: Secondary | ICD-10-CM | POA: Diagnosis not present

## 2021-02-22 DIAGNOSIS — E1122 Type 2 diabetes mellitus with diabetic chronic kidney disease: Secondary | ICD-10-CM

## 2021-02-22 DIAGNOSIS — N182 Chronic kidney disease, stage 2 (mild): Secondary | ICD-10-CM

## 2021-02-22 DIAGNOSIS — I129 Hypertensive chronic kidney disease with stage 1 through stage 4 chronic kidney disease, or unspecified chronic kidney disease: Secondary | ICD-10-CM

## 2021-02-22 DIAGNOSIS — E7800 Pure hypercholesterolemia, unspecified: Secondary | ICD-10-CM | POA: Diagnosis not present

## 2021-02-22 NOTE — Telephone Encounter (Addendum)
  Care Management   Follow Up Note   02/22/2021 Name: Andres [name] MRN: [id number] DOB: 1950/11/25   Referred by: Glendale Chard, MD Reason for referral : Chronic Care Management (RN CM Follow up call )   A second unsuccessful telephone outreach was attempted today. The patient was referred to the case management team for assistance with care management and care coordination.   Follow Up Plan: A HIPPA compliant phone message was left for the patient providing contact information and requesting a return call.   Barb Merino, RN, BSN, CCM Care Management Coordinator [place] [facility]  Direct Phone: [phone]

## 2021-03-01 ENCOUNTER — Telehealth: Payer: Self-pay

## 2021-03-01 NOTE — Chronic Care Management (AMB) (Signed)
    Chronic Care Management Pharmacy Assistant   Name: BRINTON GLENDENING  MRN: [id number] DOB: Nov 09, 1950  03/01/2021- APPOINTMENT REMINDER   Called [facility], No answer, left message of appointment on 03/02/2021 at 11:00am via telephone visit with [name], Pharm D. Notified to have all medications, supplements, blood pressure and/or blood sugar logs available during appointment and to return call if need to reschedule.  Care Gaps: Zoster Vaccines- Shingrix- Overdue - never done  COVID-19 Vaccine (4 - Booster for Moderna series)-Last completed: May 22, 2020   INFLUENZA VACCINE (Every 8 Months, August to March)-Last completed: May 08, 2020 Annual Wellness visit completed 11/12/2020, next scheduled for 11/24/2021.   Star Rating Drug: [name] XR 50 - 1000 mg - received through patient assistance Olmesartan - HCTZ 20-12.5 mg - Last filled 01-25-2021  [facility]  Rosuvastatin 10 mg- Last filled 11-09-2020 [facility]  Any gaps in medications fill history? Yes- Rosuvastatin 10 mg    [name], [place] Pharmacist Assistant [phone]

## 2021-03-02 ENCOUNTER — Telehealth: Payer: Self-pay

## 2021-03-02 NOTE — Telephone Encounter (Signed)
  Care Management   Follow Up Note   03/02/2021 Name: Andres [name] MRN: [id number] DOB: [birth year]   Referred by: Glendale Chard, MD Reason for referral : No chief complaint on file.   Patient is unable to keep appointment today due to a funeral that he will be attending. His appointment was rescheduled.  Follow Up Plan: Telephone follow up appointment with care management team member scheduled for: 03/25/2021  [name], PharmD Clinical Pharmacist [facility] [phone]

## 2021-03-02 NOTE — Chronic Care Management (AMB) (Signed)
Chronic Care Management   CCM RN Visit Note  02/22/2021 Name: Andres [name] MRN: [id number] DOB: Dec 20, 1950  Subjective: Andres [name] is a 70 y.o. year old male who is a primary care patient of Glendale Chard, MD. The care management team was consulted for assistance with disease management and care coordination needs.    Engaged with patient by telephone for follow up visit in response to provider referral for case management and/or care coordination services.   Consent to Services:  The patient was given information about Chronic Care Management services, agreed to services, and gave verbal consent prior to initiation of services.  Please see initial visit note for detailed documentation.   Patient agreed to services and verbal consent obtained.   Assessment: Review of patient past medical history, allergies, medications, health status, including review of consultants reports, laboratory and other test data, was performed as part of comprehensive evaluation and provision of chronic care management services.   SDOH (Social Determinants of Health) assessments and interventions performed:  Yes, no acute needs   CCM Care Plan  No Known Allergies  Outpatient Encounter Medications as of 02/22/2021  Medication Sig Note   Accu-Chek FastClix Lancets MISC One tab po qd dx; e11.65    ACCU-CHEK GUIDE test strip Use to check BS qd dx: E11.65    Arginine 500 MG CAPS Take by mouth. Daily    aspirin 81 MG tablet Take 81 mg by mouth daily.    cholecalciferol (VITAMIN D) 1000 UNITS tablet Take 4,000 Units by mouth daily.    Cyanocobalamin (VITAMIN B 12 PO) Take 1 capsule by mouth. 2071mg daily (Patient not taking: Reported on 12/02/2020) 12/02/2020: Patient not taking at this time until he completes his Vitamin B 12 injection series.   fluticasone (CUTIVATE) 0.05 % cream Apply topically 2 (two) times daily.    [name] XR 50-1000 MG TB24 TAKE ONE TABLET BY MOUTH TWICE DAILY    levocetirizine  (XYZAL) 5 MG tablet Take 1 tablet (5 mg total) by mouth every evening.    Multiple Vitamin (MULTIVITAMIN) tablet Take 1 tablet by mouth daily.    olmesartan-hydrochlorothiazide (BENICAR HCT) 20-12.5 MG tablet Take 1 tablet by mouth daily.    rosuvastatin (CRESTOR) 10 MG tablet One tab po qd    tadalafil (CIALIS) 5 MG tablet TAKE 1 TABLET BY MOUTH ONCE A DAY.    Facility-Administered Encounter Medications as of 02/22/2021  Medication   cyanocobalamin ((VITAMIN B-12)) injection 1,000 mcg    Patient Active Problem List   Diagnosis Date Noted   Paresthesias 11/22/2020   Benign prostatic hyperplasia with urinary frequency 11/22/2020   Class 1 obesity due to excess calories with serious comorbidity and body mass index (BMI) of 33.0 to 33.9 in adult 11/22/2020   Type 2 diabetes mellitus with stage 2 chronic kidney disease, with long-term current use of insulin [place] 07/23/2018   Pure hypercholesterolemia 07/23/2018   HTN (hypertension) 07/23/2018   Hypertensive nephropathy 02/13/2018    Conditions to be addressed/monitored: DMII with stage II CKD, Pure Hypercholesterolemia, Hypertensive Nephropathy  Care Plan : Diabetes Type 2 (Adult)  Updates made by LLynne Logan RN since 02/22/2021 12:00 AM     Problem: Disease Progression (Diabetes, Type 2)   Priority: High     Long-Range Goal: Disease Progression Prevented or Minimized   Start Date: 07/14/2020  Expected End Date: 07/14/2021  Recent Progress: On track  Priority: High  Note:   Objective:  Lab Results  Component Value Date  HGBA1C 7.0 (H) 11/09/2020   Lab Results  Component Value Date   CREATININE 1.24 11/09/2020   CREATININE 1.21 06/29/2020   CREATININE 1.22 03/09/2020   Lab Results  Component Value Date   EGFR 64 11/09/2020    Current Barriers:  Knowledge Deficits related to basic Diabetes pathophysiology and self care/management Knowledge Deficits related to medications used for management of diabetes Difficulty  obtaining or cannot afford medications Case Manager Clinical Goal(s):  Patient will demonstrate improved adherence to prescribed treatment plan for diabetes self care/management as evidenced by:  daily monitoring and recording of CBG  adherence to ADA/ carb modified diet exercise 3-5 days/week adherence to prescribed medication regimen Interventions:  02/22/21 completed successful outbound call with patient  Provided education to patient about basic DM disease process Reviewed medications with patient and discussed importance of medication adherence Review of patient status, including review of consultants reports, relevant laboratory and other test results, and medications completed. Advised patient, providing education and rationale, to check cbg before meals and at bedtime, before and after exercise, when feeling BS is too high or too low and record, calling PCP for findings outside established parameters.   Educated patient on dietary and exercise recommendations; daily glycemic control FBS 80-130, <180 after meals;15'15' rule Mailed printed educational materials related to Low Carb Smoothies; Mediterranean diet  Discussed plans with patient for ongoing care management follow up and provided patient with direct contact information for care management team Patient Self-Care Activities  - check blood sugar at prescribed times - check blood sugar before and after exercise - check blood sugar if I feel it is too high or too low - take the blood sugar log to all doctor visits - take the blood sugar meter to all doctor visits Patient Goal:  - adhere to dietary and exercise recommendations - increase water to 64 oz per day   Follow Up Plan: Telephone follow up appointment with care management team member scheduled for: 03/25/21    Care Plan : General Plan of Care (Adult)  Updates made by Lynne Logan, RN since 02/22/2021 12:00 AM     Problem: Low HDL Cholesterol   Priority: Medium      Long-Range Goal: Raise HDL Cholesterol   Start Date: 11/20/2020  Expected End Date: 11/19/2021  Recent Progress: On track  Priority: Medium  Note:   Current Barriers:  Ineffective Self Health Maintenance  Clinical Goal(s):  Collaboration with Glendale Chard, MD regarding development and update of comprehensive plan of care as evidenced by provider attestation and co-signature Inter-disciplinary care team collaboration (see longitudinal plan of care) patient will work with care management team to address care coordination and chronic disease management needs related to Disease Management Educational Needs Care Coordination Medication Management and Education Psychosocial Support   Interventions:  02/22/21 completed successful outbound call with patient  Evaluation of current treatment plan related to  low HDL Cholesterol , self-management and patient's adherence to plan as established by provider. Collaboration with Glendale Chard, MD regarding development and update of comprehensive plan of care as evidenced by provider attestation       and co-signature Inter-disciplinary care team collaboration (see longitudinal plan of care) Provided education to patient about basic disease process Review of patient status, including review of consultants reports, relevant laboratory and other test results, and medications completed. Reviewed medications with patient and discussed importance of medication adherence Educated patient on dietary recommendations to include healthy fats in his diet; Educated on exercise recommendations, 150 minutes  weekly as tolerated Mailed printed educational materials related to My Cholesterol Care Guide  Discussed plans with patient for ongoing care management follow up and provided patient with direct contact information for care management team Self Care Activities:  Continue to keep all scheduled follow up appointments Take medications as directed  Let your  healthcare team know if you are unable to take your medications Call your pharmacy for refills at least 7 days prior to running out of medication Continue to adhere to dietary and exercise recommendations  Patient Goals: - adhere to dietary and exercise recommendations  Follow Up Plan: Telephone follow up appointment with care management team member scheduled for: 03/25/21    Plan:Telephone follow up appointment with care management team member scheduled for:  03/25/21  Barb Merino, RN, BSN, CCM Care Management Coordinator [facility]  Direct Phone: [phone]

## 2021-03-02 NOTE — Patient Instructions (Signed)
Goals Addressed      Monitor and Manage My Blood Sugar-Diabetes Type 2   On track    Timeframe:  Long-Range Goal Priority:  High Start Date: 07/14/20                            Expected End Date: 07/14/21                 Follow Up Date: 03/25/21   - check blood sugar at prescribed times - check blood sugar before and after exercise - check blood sugar if I feel it is too high or too low - take the blood sugar log to all doctor visits - take the blood sugar meter to all doctor visits    Why is this important?   Checking your blood sugar at home helps to keep it from getting very high or very low.  Writing the results in a diary or log helps the doctor know how to care for you.  Your blood sugar log should have the time, date and the results.  Also, write down the amount of insulin or other medicine that you take.  Other information, like what you ate, exercise done and how you were feeling, will also be helpful.     Notes:      Raise HDL Cholesterol   On track    Timeframe:  Long-Range Goal Priority:  Medium Start Date:  11/20/20                           Expected End Date:  11/20/21      Next Scheduled Follow up date: 03/25/21  Self Care Activities:  Continue to keep all scheduled follow up appointments Take medications as directed  Let your healthcare team know if you are unable to take your medications Call your pharmacy for refills at least 7 days prior to running out of medication Continue to adhere to dietary and exercise recommendations  Patient Goals: - to raise HDL Cholesterol

## 2021-03-12 ENCOUNTER — Telehealth: Payer: Medicare HMO

## 2021-03-22 ENCOUNTER — Other Ambulatory Visit: Payer: Self-pay

## 2021-03-22 ENCOUNTER — Ambulatory Visit (INDEPENDENT_AMBULATORY_CARE_PROVIDER_SITE_OTHER): Payer: Medicare HMO | Admitting: Internal Medicine

## 2021-03-22 ENCOUNTER — Encounter: Payer: Self-pay | Admitting: Internal Medicine

## 2021-03-22 VITALS — BP 124/80 | HR 85 | Temp 98.1°F | Ht 71.2 in | Wt 232.2 lb

## 2021-03-22 DIAGNOSIS — Z794 Long term (current) use of insulin: Secondary | ICD-10-CM | POA: Diagnosis not present

## 2021-03-22 DIAGNOSIS — Z23 Encounter for immunization: Secondary | ICD-10-CM

## 2021-03-22 DIAGNOSIS — N182 Chronic kidney disease, stage 2 (mild): Secondary | ICD-10-CM

## 2021-03-22 DIAGNOSIS — Z6832 Body mass index (BMI) 32.0-32.9, adult: Secondary | ICD-10-CM

## 2021-03-22 DIAGNOSIS — I129 Hypertensive chronic kidney disease with stage 1 through stage 4 chronic kidney disease, or unspecified chronic kidney disease: Secondary | ICD-10-CM

## 2021-03-22 DIAGNOSIS — E6609 Other obesity due to excess calories: Secondary | ICD-10-CM

## 2021-03-22 DIAGNOSIS — E1122 Type 2 diabetes mellitus with diabetic chronic kidney disease: Secondary | ICD-10-CM | POA: Diagnosis not present

## 2021-03-22 MED ORDER — ACCU-CHEK GUIDE VI STRP: ORAL_STRIP | 11 refills | Status: AC

## 2021-03-22 MED ORDER — ZOSTER VAC RECOMB ADJUVANTED 50 MCG/0.5ML IM SUSR: 0.5000 mL | Freq: Once | INTRAMUSCULAR | 0 refills | Status: AC

## 2021-03-22 NOTE — Progress Notes (Signed)
I,Yamilka [employer] as a Education administrator for [name], MD.,have documented all relevant documentation on the behalf of [name], MD,as directed by  [name], MD while in the presence of [name], MD.  This visit occurred during the SARS-CoV-2 public health emergency.  Safety protocols were in place, including screening questions prior to the visit, additional usage of staff PPE, and extensive cleaning of exam room while observing appropriate contact time as indicated for disinfecting solutions.  Subjective:     Patient ID: Andres [name] , male    DOB: Oct 26, 1950 , 70 y.o.   MRN: [id number]   Chief Complaint  Patient presents with   Diabetes   Hypertension    HPI  He presents for DM/HTN check. He reports compliance with meds. He reports his sugars are 90s-110s in am and 130s in pm.    Diabetes He presents for his follow-up diabetic visit. He has type 2 diabetes mellitus. His disease course has been stable. There are no hypoglycemic associated symptoms. Pertinent negatives for diabetes include no blurred vision and no chest pain. There are no hypoglycemic complications. Diabetic complications include nephropathy. Risk factors for coronary artery disease include diabetes mellitus, dyslipidemia, hypertension and male sex. He is following a diabetic diet. His breakfast blood glucose is taken between 8-9 am. His breakfast blood glucose range is generally 90-110 mg/dl. An ACE inhibitor/angiotensin II receptor blocker is being taken. Eye exam is not current.  Hypertension This is a chronic problem. The current episode started more than 1 year ago. The problem has been gradually improving since onset. The problem is controlled. Pertinent negatives include no blurred vision, chest pain, palpitations or shortness of breath. Past treatments include angiotensin blockers and diuretics. The current treatment provides moderate improvement.    Past Medical History:  Diagnosis  Date   Diabetes ([place])    High cholesterol    Hypertension    states under control with med., has been on med. x 5 yr.   Seasonal allergies    Umbilical hernia 10/1285     Family History  Problem Relation Age of Onset   Healthy Mother    Early death Father      Current Outpatient Medications:    Accu-Chek FastClix Lancets MISC, One tab po qd dx; e11.65, Disp: 100 each, Rfl: 11   Arginine 500 MG CAPS, Take by mouth. Daily, Disp: , Rfl:    aspirin 81 MG tablet, Take 81 mg by mouth daily., Disp: , Rfl:    cholecalciferol (VITAMIN D) 1000 UNITS tablet, Take 4,000 Units by mouth daily., Disp: , Rfl:    Cyanocobalamin (VITAMIN B 12 PO), Take 1 capsule by mouth. 2034mg daily, Disp: , Rfl:    fluticasone (CUTIVATE) 0.05 % cream, Apply topically 2 (two) times daily., Disp: 60 g, Rfl: 2   [name] XR 50-1000 MG TB24, TAKE ONE TABLET BY MOUTH TWICE DAILY, Disp: 180 tablet, Rfl: 0   levocetirizine (XYZAL) 5 MG tablet, Take 1 tablet (5 mg total) by mouth every evening., Disp: 90 tablet, Rfl: 2   Multiple Vitamin (MULTIVITAMIN) tablet, Take 1 tablet by mouth daily., Disp: , Rfl:    olmesartan-hydrochlorothiazide (BENICAR HCT) 20-12.5 MG tablet, Take 1 tablet by mouth daily., Disp: 90 tablet, Rfl: 2   rosuvastatin (CRESTOR) 10 MG tablet, One tab po qd, Disp: 90 tablet, Rfl: 2   tadalafil (CIALIS) 5 MG tablet, TAKE 1 TABLET BY MOUTH ONCE A DAY., Disp: 30 tablet, Rfl: 2   Zoster  Vaccine Adjuvanted [facility]) injection, Inject 0.5 mLs into the muscle once for 1 dose., Disp: 0.5 mL, Rfl: 0   ACCU-CHEK GUIDE test strip, Use to check BS qd dx: E11.65, Disp: 100 each, Rfl: 11  Current Facility-Administered Medications:    cyanocobalamin ((VITAMIN B-12)) injection 1,000 mcg, 1,000 mcg, Intramuscular, Once, Glendale Chard, MD   No Known Allergies   Review of Systems  Constitutional: Negative.   Eyes:  Negative for blurred vision.  Respiratory: Negative.  Negative for shortness of breath.    Cardiovascular: Negative.  Negative for chest pain and palpitations.  Gastrointestinal: Negative.   Neurological: Negative.   Psychiatric/Behavioral: Negative.      Today's Vitals   03/22/21 0929  BP: 124/80  Pulse: 85  Temp: 98.1 F (36.7 C)  Weight: 232 lb 3.2 oz (105.3 kg)  Height: 5' 11.2" (1.808 m)  PainSc: 0-No pain   Body mass index is 32.2 kg/m.  Wt Readings from Last 3 Encounters:  03/22/21 232 lb 3.2 oz (105.3 kg)  12/08/20 234 lb 6.4 oz (106.3 kg)  12/01/20 234 lb (106.1 kg)     Objective:  Physical Exam Vitals and nursing note reviewed.  Constitutional:      Appearance: Normal appearance.  HENT:     Nose:     Comments: Masked     Mouth/Throat:     Comments: Masked  Cardiovascular:     Rate and Rhythm: Normal rate and regular rhythm.     Heart sounds: Normal heart sounds.  Pulmonary:     Effort: Pulmonary effort is normal.     Breath sounds: Normal breath sounds.  Musculoskeletal:     Cervical back: Normal range of motion.  Skin:    General: Skin is warm.  Neurological:     General: No focal deficit present.     Mental Status: He is alert.  Psychiatric:        Mood and Affect: Mood normal.        Assessment And Plan:     1. Type 2 diabetes mellitus with stage 2 chronic kidney disease, with long-term current use of insulin (HCC) Comments: Chronic, will check labs as below. He states he is about to run out of meds, gets through [facility]. I will touch base with [facility] to check on this.  - Hemoglobin A1c - BMP8+eGFR  2. Hypertensive nephropathy Comments: Chronic, well controlled. He is encouraegd to follow a low sodium diet. No med changes today.   3. Class 1 obesity due to excess calories with serious comorbidity and body mass index (BMI) of 32.0 to 32.9 in adult Comments: He is encouraged to strive for at least 150 minutes of exercise per week.   4. Immunization due Comments: He was given high dose flu vaccine today.  I will send rx  Shingrix to his local pharmacy.  - Flu Vaccine QUAD High Dose(Fluad) - Zoster Vaccine Adjuvanted [facility]) injection; Inject 0.5 mLs into the muscle once for 1 dose.  Dispense: 0.5 mL; Refill: 0   Patient was given opportunity to ask questions. Patient verbalized understanding of the plan and was able to repeat key elements of the plan. All questions were answered to their satisfaction.   I, [name], MD, have reviewed all documentation for this visit. The documentation on 03/22/21 for the exam, diagnosis, procedures, and orders are all accurate and complete.   IF YOU HAVE BEEN REFERRED TO A SPECIALIST, IT MAY TAKE 1-2 WEEKS TO SCHEDULE/PROCESS THE REFERRAL. IF YOU HAVE  NOT HEARD FROM US/SPECIALIST IN TWO WEEKS, PLEASE GIVE [place] A CALL AT [phone].   THE PATIENT IS ENCOURAGED TO PRACTICE SOCIAL DISTANCING DUE TO THE COVID-19 PANDEMIC.

## 2021-03-22 NOTE — Patient Instructions (Signed)
Diabetes Mellitus and Nutrition, Adult When you have diabetes, or diabetes mellitus, it is very important to have healthy eating habits because your blood sugar (glucose) levels are greatly affected by what you eat and drink. Eating healthy foods in the right amounts, at about the same times every day, can help you:  Control your blood glucose.  Lower your risk of heart disease.  Improve your blood pressure.  Reach or maintain a healthy weight. What can affect my meal plan? Every person with diabetes is different, and each person has different needs for a meal plan. Your health care provider may recommend that you work with a dietitian to make a meal plan that is best for you. Your meal plan may vary depending on factors such as:  The calories you need.  The medicines you take.  Your weight.  Your blood glucose, blood pressure, and cholesterol levels.  Your activity level.  Other health conditions you have, such as heart or kidney disease. How do carbohydrates affect me? Carbohydrates, also called carbs, affect your blood glucose level more than any other type of food. Eating carbs naturally raises the amount of glucose in your blood. Carb counting is a method for keeping track of how many carbs you eat. Counting carbs is important to keep your blood glucose at a healthy level, especially if you use insulin or take certain oral diabetes medicines. It is important to know how many carbs you can safely have in each meal. This is different for every person. Your dietitian can help you calculate how many carbs you should have at each meal and for each snack. How does alcohol affect me? Alcohol can cause a sudden decrease in blood glucose (hypoglycemia), especially if you use insulin or take certain oral diabetes medicines. Hypoglycemia can be a life-threatening condition. Symptoms of hypoglycemia, such as sleepiness, dizziness, and confusion, are similar to symptoms of having too much  alcohol.  Do not drink alcohol if: ? Your health care provider tells you not to drink. ? You are pregnant, may be pregnant, or are planning to become pregnant.  If you drink alcohol: ? Do not drink on an empty stomach. ? Limit how much you use to:  0-1 drink a day for women.  0-2 drinks a day for men. ? Be aware of how much alcohol is in your drink. In the [place] one drink equals one 12 oz bottle of beer (355 mL), one 5 oz glass of wine (148 mL), or one 1 oz glass of hard liquor (44 mL). ? Keep yourself hydrated with water, diet soda, or unsweetened iced tea.  Keep in mind that regular soda, juice, and other mixers may contain a lot of sugar and must be counted as carbs. What are tips for following this plan? Reading food labels  Start by checking the serving size on the "Nutrition Facts" label of packaged foods and drinks. The amount of calories, carbs, fats, and other nutrients listed on the label is based on one serving of the item. Many items contain more than one serving per package.  Check the total grams (g) of carbs in one serving. You can calculate the number of servings of carbs in one serving by dividing the total carbs by 15. For example, if a food has 30 g of total carbs per serving, it would be equal to 2 servings of carbs.  Check the number of grams (g) of saturated fats and trans fats in one serving. Choose foods that have   a low amount or none of these fats.  Check the number of milligrams (mg) of salt (sodium) in one serving. Most people should limit total sodium intake to less than 2,300 mg per day.  Always check the nutrition information of foods labeled as "low-fat" or "nonfat." These foods may be higher in added sugar or refined carbs and should be avoided.  Talk to your dietitian to identify your daily goals for nutrients listed on the label. Shopping  Avoid buying canned, pre-made, or processed foods. These foods tend to be high in fat, sodium, and added  sugar.  Shop around the outside edge of the grocery store. This is where you will most often find fresh fruits and vegetables, bulk grains, fresh meats, and fresh dairy. Cooking  Use low-heat cooking methods, such as baking, instead of high-heat cooking methods like deep frying.  Cook using healthy oils, such as olive, canola, or sunflower oil.  Avoid cooking with butter, cream, or high-fat meats. Meal planning  Eat meals and snacks regularly, preferably at the same times every day. Avoid going long periods of time without eating.  Eat foods that are high in fiber, such as fresh fruits, vegetables, beans, and whole grains. Talk with your dietitian about how many servings of carbs you can eat at each meal.  Eat 4-6 oz (112-168 g) of lean protein each day, such as lean meat, chicken, fish, eggs, or tofu. One ounce (oz) of lean protein is equal to: ? 1 oz (28 g) of meat, chicken, or fish. ? 1 egg. ?  cup (62 g) of tofu.  Eat some foods each day that contain healthy fats, such as avocado, nuts, seeds, and fish.   What foods should I eat? Fruits Berries. Apples. Oranges. Peaches. Apricots. Plums. Grapes. Mango. Papaya. Pomegranate. Kiwi. Cherries. Vegetables Lettuce. Spinach. Leafy greens, including kale, chard, collard greens, and mustard greens. Beets. Cauliflower. Cabbage. Broccoli. Carrots. Green beans. Tomatoes. Peppers. Onions. Cucumbers. Brussels sprouts. Grains Whole grains, such as whole-wheat or whole-grain bread, crackers, tortillas, cereal, and pasta. Unsweetened oatmeal. Quinoa. Brown or wild rice. Meats and other proteins Seafood. Poultry without skin. Lean cuts of poultry and beef. Tofu. Nuts. Seeds. Dairy Low-fat or fat-free dairy products such as milk, yogurt, and cheese. The items listed above may not be a complete list of foods and beverages you can eat. Contact a dietitian for more information. What foods should I avoid? Fruits Fruits canned with  syrup. Vegetables Canned vegetables. Frozen vegetables with butter or cream sauce. Grains Refined white flour and flour products such as bread, pasta, snack foods, and cereals. Avoid all processed foods. Meats and other proteins Fatty cuts of meat. Poultry with skin. Breaded or fried meats. Processed meat. Avoid saturated fats. Dairy Full-fat yogurt, cheese, or milk. Beverages Sweetened drinks, such as soda or iced tea. The items listed above may not be a complete list of foods and beverages you should avoid. Contact a dietitian for more information. Questions to ask a health care provider  Do I need to meet with a diabetes educator?  Do I need to meet with a dietitian?  What number can I call if I have questions?  When are the best times to check my blood glucose? Where to find more information:  American Diabetes Association: diabetes.org  Academy of Nutrition and Dietetics: www.eatright.org  National Institute of Diabetes and Digestive and Kidney Diseases: www.niddk.nih.gov  Association of Diabetes Care and Education Specialists: www.diabeteseducator.org Summary  It is important to have healthy eating   habits because your blood sugar (glucose) levels are greatly affected by what you eat and drink.  A healthy meal plan will help you control your blood glucose and maintain a healthy lifestyle.  Your health care provider may recommend that you work with a dietitian to make a meal plan that is best for you.  Keep in mind that carbohydrates (carbs) and alcohol have immediate effects on your blood glucose levels. It is important to count carbs and to use alcohol carefully. This information is not intended to replace advice given to you by your health care provider. Make sure you discuss any questions you have with your health care provider. Document Revised: 06/04/2019 Document Reviewed: 06/04/2019 Elsevier Patient Education  2021 Elsevier Inc.  

## 2021-03-23 ENCOUNTER — Telehealth: Payer: Self-pay

## 2021-03-23 LAB — BMP8+EGFR
BUN/Creatinine Ratio: 11 (ref 10–24)
BUN: 13 mg/dL (ref 8–27)
CO2: 22 mmol/L (ref 20–29)
Calcium: 9.3 mg/dL (ref 8.6–10.2)
Chloride: 98 mmol/L (ref 96–106)
Creatinine, Ser: 1.17 mg/dL (ref 0.76–1.27)
Glucose: 141 mg/dL — ABNORMAL HIGH (ref 65–99)
Potassium: 4.4 mmol/L (ref 3.5–5.2)
Sodium: 137 mmol/L (ref 134–144)
eGFR: 68 mL/min/{1.73_m2} (ref >59)

## 2021-03-23 LAB — HEMOGLOBIN A1C
Est. average glucose Bld gHb Est-mCnc: 146 mg/dL
Hgb A1c MFr Bld: 6.7 % — ABNORMAL HIGH (ref 4.8–5.6)

## 2021-03-23 NOTE — Chronic Care Management (AMB) (Addendum)
  Andres [name] was reminded to have all medications, supplements and any blood glucose and blood pressure readings available for review with [name], Pharm. D, at his telephone visit on 03-24-2021 at 12:00.   Questions: Have you had any recent office visit or specialist visit outside of [place]? Patient states no  Are there any concerns you would like to discuss during your office visit? Patient states he wants to follow up with [name]. [name] will check on application when she goes to the office  Are you having any problems obtaining your medications? (Whether it pharmacy issues or cost) Patient states no  If patient has any PAP medications ask if they are having any problems getting their PAP medication or refill? Patient states [name].  Care Gaps: Zoster Vaccines- Shingrix- Overdue - never done  COVID-19 Vaccine (4 - Booster for Moderna series)-Last completed: May 22, 2020                       INFLUENZA VACCINE (Every 8 Months, August to March)-Last completed: May 08, 2020 Annual Wellness visit completed 11/12/2020, next scheduled for 11/24/2021.  Star Rating Drug: [name] XR 50 - 1000 mg - received through patient assistance Olmesartan - HCTZ 20-12.5 mg - Last filled 01-25-2021  [facility]  Rosuvastatin 10 mg- Last filled 11-09-2020 [facility] [place] (Patient states [name] wanted him to cut back so he still has a half of bottle left)   03-24-2021: Contacted [employer] patient assistance and was told [name] will have to call in refills for the rest of the year to the pharmacy at [phone] or fax refill request to [phone]. Sent message to [name] and [name] CPP.  Any gaps in medications fill history? No  [place] Pharmacist Assistant [phone]

## 2021-03-24 ENCOUNTER — Ambulatory Visit (INDEPENDENT_AMBULATORY_CARE_PROVIDER_SITE_OTHER): Payer: Medicare HMO

## 2021-03-24 ENCOUNTER — Other Ambulatory Visit: Payer: Self-pay

## 2021-03-24 DIAGNOSIS — N182 Chronic kidney disease, stage 2 (mild): Secondary | ICD-10-CM

## 2021-03-24 DIAGNOSIS — E1122 Type 2 diabetes mellitus with diabetic chronic kidney disease: Secondary | ICD-10-CM

## 2021-03-24 DIAGNOSIS — I10 Essential (primary) hypertension: Secondary | ICD-10-CM

## 2021-03-24 MED ORDER — JANUMET XR 50-1000 MG PO TB24: 1.0000 | ORAL_TABLET | Freq: Two times a day (BID) | ORAL | 1 refills | Status: DC

## 2021-03-24 NOTE — Progress Notes (Signed)
Chronic Care Management Pharmacy Note  04/01/2021 Name:  Andres [name] MRN:  [id number] DOB:  10/08/1950  Summary: Patient reports that he is being more careful with his eating habits.   Recommendations/Changes made from today's visit: Recommend patient receive COVID-19 Booster vaccine.  Plan: Patient is going to receive COVID-19 booster vaccine.    Subjective: Andres [name] is an 70 y.o. year old male who is a primary patient of Glendale Chard, MD.  The CCM team was consulted for assistance with disease management and care coordination needs.    Engaged with patient by telephone for follow up visit in response to provider referral for pharmacy case management and/or care coordination services.   Consent to Services:  The patient was given information about Chronic Care Management services, agreed to services, and gave verbal consent prior to initiation of services.  Please see initial visit note for detailed documentation.   Patient Care Team: Glendale Chard, MD as PCP - General (Internal Medicine) Harl Bowie Alphonse Guild, MD as PCP - Cardiology (Cardiology) Rex Kras Claudette Stapler, RN as Case Manager Mayford Knife, [facility] (Pharmacist)  Recent office visits: 03/22/2021  Recent consult visits: 01/25/2021 [facility]rology [facility] visits: None in previous 6 months   Objective:  Lab Results  Component Value Date   CREATININE 1.17 03/22/2021   BUN 13 03/22/2021   GFRNONAA 62 06/29/2020   GFRAA 71 06/29/2020   NA 137 03/22/2021   K 4.4 03/22/2021   CALCIUM 9.3 03/22/2021   CO2 22 03/22/2021   GLUCOSE 141 (H) 03/22/2021    Lab Results  Component Value Date/Time   HGBA1C 6.7 (H) 03/22/2021 10:15 AM   HGBA1C 7.0 (H) 11/09/2020 12:22 PM   HGBA1C 7.7 02/13/2018 12:00 AM   MICROALBUR 30 11/07/2019 11:30 AM   MICROALBUR 10 10/30/2018 10:29 AM    Last diabetic Eye exam:  Lab Results  Component Value Date/Time   HMDIABEYEEXA No Retinopathy 06/25/2020 12:00 AM    Last  diabetic Foot exam: No results found for: HMDIABFOOTEX   Lab Results  Component Value Date   CHOL 102 11/09/2020   HDL 30 (L) 11/09/2020   LDLCALC 48 11/09/2020   TRIG 132 11/09/2020   CHOLHDL 3.4 11/09/2020    Hepatic Function Latest Ref Rng & Units 11/09/2020 06/29/2020 11/07/2019  Total Protein 6.0 - 8.5 g/dL 7.4 7.8 8.0  Albumin 3.8 - 4.8 g/dL 4.5 4.6 4.5  AST 0 - 40 IU/L '20 20 24  ' ALT 0 - 44 IU/L '19 29 21  ' Alk Phosphatase 44 - 121 IU/L 57 64 58  Total Bilirubin 0.0 - 1.2 mg/dL 0.3 0.3 0.5  Bilirubin, Direct 0.00 - 0.40 mg/dL - - -    Lab Results  Component Value Date/Time   TSH 2.260 11/09/2020 12:22 PM   TSH 2.410 06/29/2020 03:13 PM    CBC Latest Ref Rng & Units 11/09/2020 11/07/2019 10/30/2018  WBC 3.4 - 10.8 x10E3/uL 7.7 8.2 7.4  Hemoglobin 13.0 - 17.7 g/dL 13.3 13.7 13.6  Hematocrit 37.5 - 51.0 % 39.7 41.1 40.0  Platelets 150 - 450 x10E3/uL 216 222 199    No results found for: VD25OH  Clinical ASCVD: No  The ASCVD Risk score (Arnett DK, et al., 2019) failed to calculate for the following reasons:   The valid total cholesterol range is 130 to 320 mg/dL    Depression screen [facility] 2/9 11/12/2020 11/09/2020 11/07/2019  Decreased Interest 0 0 0  Down, Depressed, Hopeless 0 0 0  PHQ -  2 Score 0 0 0  Altered sleeping - - 0  Tired, decreased energy - - 0  Change in appetite - - 0  Feeling bad or failure about yourself  - - 0  Trouble concentrating - - 0  Moving slowly or fidgety/restless - - 0  Suicidal thoughts - - 0  PHQ-9 Score - - 0  Difficult doing work/chores - - Not difficult at all     Social History   Tobacco Use  Smoking Status Former   Packs/day: 0.50   Years: 22.00   Pack years: 11.00   Types: Cigarettes   Quit date: 07/11/2011   Years since quitting: 9.7  Smokeless Tobacco Never  Tobacco Comments   he has quit   BP Readings from Last 3 Encounters:  03/22/21 124/80  12/08/20 134/72  12/01/20 134/70   Pulse Readings from Last 3 Encounters:   03/22/21 85  12/08/20 76  12/01/20 88   Wt Readings from Last 3 Encounters:  03/22/21 232 lb 3.2 oz (105.3 kg)  12/08/20 234 lb 6.4 oz (106.3 kg)  12/01/20 234 lb (106.1 kg)   BMI Readings from Last 3 Encounters:  03/22/21 32.20 kg/m  12/08/20 32.33 kg/m  12/01/20 32.27 kg/m    Assessment/Interventions: Review of patient past medical history, allergies, medications, health status, including review of consultants reports, laboratory and other test data, was performed as part of comprehensive evaluation and provision of chronic care management services.   SDOH:  (Social Determinants of Health) assessments and interventions performed: No  SDOH Screenings   Alcohol Screen: Not on file  Depression (PHQ2-9): Low Risk    PHQ-2 Score: 0  Financial Resource Strain: Low Risk    Difficulty of Paying Living Expenses: Not hard at all  Food Insecurity: No Food Insecurity   Worried About Charity fundraiser in the Last Year: Never true   Ran Out of Food in the Last Year: Never true  Housing: Not on file  Physical Activity: Sufficiently Active   Days of Exercise per Week: 3 days   Minutes of Exercise per Session: 60 min  Social Connections: Not on file  Stress: No Stress Concern Present   Feeling of Stress : Not at all  Tobacco Use: Medium Risk   Smoking Tobacco Use: Former   Smokeless Tobacco Use: Never  Transportation Needs: No Transportation Needs   Lack of Transportation (Medical): No   Lack of Transportation (Non-Medical): No    CCM Care Plan  No Known Allergies  Medications Reviewed Today     Reviewed by Lynne Logan, RN (Registered Nurse) on 03/25/21 at 1315  Med List Status: <None>   Medication Order Taking? Sig Documenting Provider Last Dose Status Informant  Accu-Chek FastClix Lancets MISC [id number] No One tab po qd dx; e11.65 Glendale Chard, MD Taking Active   ACCU-CHEK GUIDE test strip [id number]  Use to check BS qd dx: E11.65 Glendale Chard, MD  Active    Arginine 500 MG CAPS [id number] No Take by mouth. Daily [provider] Taking Active Self  aspirin 81 MG tablet [id number] No Take 81 mg by mouth daily. [provider] Taking Active   cholecalciferol (VITAMIN D) 1000 UNITS tablet [id number] No Take 4,000 Units by mouth daily. [provider] Taking Active   cyanocobalamin ((VITAMIN B-12)) injection 1,000 mcg [id number]   Glendale Chard, MD  Active   Cyanocobalamin (VITAMIN B 12 PO) [id number] No Take 1 capsule by mouth. 2020mg daily [provider] Taking Active  Med Note Jeoffrey Massed Dec 02, 2020 11:46 AM) Patient not taking at this time until he completes his Vitamin B 12 injection series.  fluticasone (CUTIVATE) 0.05 % cream [id number] No Apply topically 2 (two) times daily. Glendale Chard, MD Taking Active   [name] XR 50-1000 MG TB24 [id number]  Take 1 tablet by mouth 2 (two) times daily. Glendale Chard, MD  Active   levocetirizine (XYZAL) 5 MG tablet [id number] No Take 1 tablet (5 mg total) by mouth every evening. Glendale Chard, MD Taking Active   Multiple Vitamin (MULTIVITAMIN) tablet [id number] No Take 1 tablet by mouth daily. [provider] Taking Active   olmesartan-hydrochlorothiazide (BENICAR HCT) 20-12.5 MG tablet [id number] No Take 1 tablet by mouth daily. Glendale Chard, MD Taking Active   rosuvastatin (CRESTOR) 10 MG tablet [id number] No One tab po qd Glendale Chard, MD Taking Active   tadalafil (CIALIS) 5 MG tablet [id number] No TAKE 1 TABLET BY MOUTH ONCE A DAY. Glendale Chard, MD Taking Active             Patient Active Problem List   Diagnosis Date Noted   Paresthesias 11/22/2020   Benign prostatic hyperplasia with urinary frequency 11/22/2020   Class 1 obesity due to excess calories with serious comorbidity and body mass index (BMI) of 33.0 to 33.9 in adult 11/22/2020   Type 2 diabetes mellitus with stage 2 chronic kidney disease, with long-term current use  of insulin ([place]) 07/23/2018   Pure hypercholesterolemia 07/23/2018   HTN (hypertension) 07/23/2018   Hypertensive nephropathy 02/13/2018    Immunization History  Administered Date(s) Administered   Fluad Quad(high Dose 65+) 05/08/2020, 03/22/2021   Influenza, High Dose Seasonal PF 04/11/2018, 04/01/2019   Moderna SARS-COV2 Booster Vaccination 05/22/2020   Moderna Sars-Covid-2 Vaccination 08/17/2019, 09/17/2019   Pneumococcal Polysaccharide-23 07/23/2018   Tdap 04/17/2019    Conditions to be addressed/monitored:  Hypertension and Diabetes  Care Plan : [place]  Updates made by Mayford Knife, [place] since 04/01/2021 12:00 AM     Problem: HTN, DM II   Priority: High  Onset Date: 09/29/2020     Long-Range Goal: Disease State Management   Start Date: 09/29/2020  Recent Progress: On track  Priority: High  Note:   Current Barriers:  Unable to independently monitor therapeutic efficacy  Pharmacist Clinical Goal(s):  Patient will achieve adherence to monitoring guidelines and medication adherence to achieve therapeutic efficacy through collaboration with PharmD and provider.   Interventions: 1:1 collaboration with Glendale Chard, MD regarding development and update of comprehensive plan of care as evidenced by provider attestation and co-signature Inter-disciplinary care team collaboration (see longitudinal plan of care) Comprehensive medication review performed; medication list updated in electronic medical record  Hypertension (BP goal <130/80) -Controlled -Current treatment: Olmesartan - Hydrochlorothiazide 20-12.5 mg tablet once per day  -Current home readings: none noted at this time  -Current dietary habits: limiting the amount of salt in his food -Current exercise habits: patient reports exercising  -Denies hypotensive/hypertensive symptoms -Educated on Daily salt intake goal < 2300 mg; Exercise goal of 150 minutes per week; -Counseled to monitor BP at  home three times per week, document, and provide log at future appointments -Recommended to continue current medication  Diabetes (A1c goal <7%) -Controlled -Current medications: [name] XR 50-1000 mg tablet twice per day -Medications previously tried: [place]  -Current home glucose readings fasting glucose: 114, 132, 112-118 -Denies hypoglycemic/hyperglycemic symptoms -Current meal patterns: eating more fish, rarely eating a hamburger, eating  more chicken and fish   snacks: limiting the amount of snacks in his diet.  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  -Educated on A1c and blood sugar goals; Complications of diabetes including kidney damage, retinal damage, and cardiovascular disease; Exercise goal of 150 minutes per week; -Counseled to check feet daily and get yearly eye exams -Counseled on diet and exercise extensively Recommended to continue current medication  Patient Goals/Self-Care Activities Patient will:  - take medications as prescribed  Follow Up Plan: Telephone follow up appointment with care management team member scheduled for: 07/22/2021 The patient has been provided with contact information for the care management team and has been advised to call with any health related questions or concerns.       Medication Assistance:  [name] obtained through [employer] medication assistance program.  Enrollment ends 06/2021.  Compliance/Adherence/Medication fill history: Care Gaps: Shingrix Vaccine - going to receive second dose in the first week in October  COVID-19 Booster   Star-Rating Drugs: Januemet XR 50- 1000 MG tablet  Rosuvustatin 10 mg tablet  Olmesartan - hydrochlorothiazide 20-12.5 mg tablet  Patient's preferred pharmacy is:  [place], [address] [place] [address] Phone: [phone] Fax:  [phone]  [place], [place] [address] Phone: [phone] Fax: [phone]  [facility] [address] [place] - [address] [place] [address] [place] [address] [place] [address] Phone: [phone] Fax: [phone]  Uses pill box? Yes Pt endorses 95% compliance  We discussed: Benefits of medication synchronization, packaging and delivery as well as enhanced pharmacist oversight with [employer]. Patient decided to: Continue current medication management strategy  Care Plan and Follow Up Patient Decision:  Patient agrees to Care Plan and Follow-up.  Plan: Telephone follow up appointment with care management team member scheduled for:  07/22/2021 and The patient has been provided with contact information for the care management team and has been advised to call with any health related questions or concerns.   [name], PharmD Clinical Pharmacist [facility] [phone]

## 2021-03-25 ENCOUNTER — Telehealth: Payer: Medicare HMO

## 2021-03-25 ENCOUNTER — Ambulatory Visit: Payer: Self-pay

## 2021-03-25 DIAGNOSIS — I129 Hypertensive chronic kidney disease with stage 1 through stage 4 chronic kidney disease, or unspecified chronic kidney disease: Secondary | ICD-10-CM

## 2021-03-25 DIAGNOSIS — E7800 Pure hypercholesterolemia, unspecified: Secondary | ICD-10-CM

## 2021-03-25 DIAGNOSIS — E1122 Type 2 diabetes mellitus with diabetic chronic kidney disease: Secondary | ICD-10-CM

## 2021-03-29 NOTE — Chronic Care Management (AMB) (Signed)
Chronic Care Management   CCM RN Visit Note  03/25/2021 Name: Andres [name] MRN: [id number] DOB: 09-25-1950  Subjective: Andres [name] is a 70 y.o. year old male who is a primary care patient of Glendale Chard, MD. The care management team was consulted for assistance with disease management and care coordination needs.    Engaged with patient by telephone for follow up visit in response to provider referral for case management and/or care coordination services.   Consent to Services:  The patient was given information about Chronic Care Management services, agreed to services, and gave verbal consent prior to initiation of services.  Please see initial visit note for detailed documentation.   Patient agreed to services and verbal consent obtained.   Assessment: Review of patient past medical history, allergies, medications, health status, including review of consultants reports, laboratory and other test data, was performed as part of comprehensive evaluation and provision of chronic care management services.   SDOH (Social Determinants of Health) assessments and interventions performed:  No Acute Challenges   CCM Care Plan  No Known Allergies  Outpatient Encounter Medications as of 03/25/2021  Medication Sig Note   Accu-Chek FastClix Lancets MISC One tab po qd dx; e11.65    ACCU-CHEK GUIDE test strip Use to check BS qd dx: E11.65    Arginine 500 MG CAPS Take by mouth. Daily    aspirin 81 MG tablet Take 81 mg by mouth daily.    cholecalciferol (VITAMIN D) 1000 UNITS tablet Take 4,000 Units by mouth daily.    Cyanocobalamin (VITAMIN B 12 PO) Take 1 capsule by mouth. 2098mg daily 12/02/2020: Patient not taking at this time until he completes his Vitamin B 12 injection series.   fluticasone (CUTIVATE) 0.05 % cream Apply topically 2 (two) times daily.    [name] XR 50-1000 MG TB24 Take 1 tablet by mouth 2 (two) times daily.    levocetirizine (XYZAL) 5 MG tablet Take 1 tablet (5 mg  total) by mouth every evening.    Multiple Vitamin (MULTIVITAMIN) tablet Take 1 tablet by mouth daily.    olmesartan-hydrochlorothiazide (BENICAR HCT) 20-12.5 MG tablet Take 1 tablet by mouth daily.    rosuvastatin (CRESTOR) 10 MG tablet One tab po qd    tadalafil (CIALIS) 5 MG tablet TAKE 1 TABLET BY MOUTH ONCE A DAY.    Facility-Administered Encounter Medications as of 03/25/2021  Medication   cyanocobalamin ((VITAMIN B-12)) injection 1,000 mcg    Patient Active Problem List   Diagnosis Date Noted   Paresthesias 11/22/2020   Benign prostatic hyperplasia with urinary frequency 11/22/2020   Class 1 obesity due to excess calories with serious comorbidity and body mass index (BMI) of 33.0 to 33.9 in adult 11/22/2020   Type 2 diabetes mellitus with stage 2 chronic kidney disease, with long-term current use of insulin ([place] 07/23/2018   Pure hypercholesterolemia 07/23/2018   HTN (hypertension) 07/23/2018   Hypertensive nephropathy 02/13/2018    Conditions to be addressed/monitored: DMII with stage II CKD, Pure Hypercholesterolemia, Hypertensive Nephropathy  Care Plan : Diabetes Type 2 (Adult)  Updates made by LLynne Logan RN since 03/25/2021 12:00 AM     Problem: Disease Progression (Diabetes, Type 2)   Priority: High     Long-Range Goal: Disease Progression Prevented or Minimized   Start Date: 07/14/2020  Expected End Date: 07/14/2021  Recent Progress: On track  Priority: High  Note:   Objective:  Lab Results  Component Value Date   HGBA1C 6.7 (H) 03/22/2021  Lab Results  Component Value Date   CREATININE 1.17 03/22/2021   CREATININE 1.24 11/09/2020   CREATININE 1.21 06/29/2020   Lab Results  Component Value Date   EGFR 68 03/22/2021  Current Barriers:  Knowledge Deficits related to basic Diabetes pathophysiology and self care/management Knowledge Deficits related to medications used for management of diabetes Difficulty obtaining or cannot afford medications Case  Manager Clinical Goal(s):  Patient will demonstrate improved adherence to prescribed treatment plan for diabetes self care/management as evidenced by:  daily monitoring and recording of CBG  adherence to ADA/ carb modified diet exercise 3-5 days/week adherence to prescribed medication regimen Interventions:  03/25/21 completed successful outbound call with patient  Provided education to patient about basic DM disease process Reviewed medications with patient and discussed importance of medication adherence Review of patient status, including review of consultants reports, relevant laboratory and other test results, and medications completed. Advised patient, providing education and rationale, to check cbg before meals and at bedtime, before and after exercise, when feeling BS is too high or too low and record, calling PCP for findings outside established parameters.   Educated patient on dietary and exercise recommendations; daily glycemic control FBS 80-130, <180 after meals;15'15' rule Confirmed printed educational materials related to Low Carb Smoothies; Mediterranean diet  Discussed plans with patient for ongoing care management follow up and provided patient with direct contact information for care management team Patient Self-Care Activities  - check blood sugar at prescribed times - check blood sugar before and after exercise - check blood sugar if I feel it is too high or too low - take the blood sugar log to all doctor visits - take the blood sugar meter to all doctor visits Patient Goal:  - adhere to dietary and exercise recommendations - increase water to 64 oz per day   Follow Up Plan: Telephone follow up appointment with care management team member scheduled for: 08/04/21   Care Plan : Abnormal HDL  Updates made by Lynne Logan, RN since 03/25/2021 12:00 AM     Problem: Low HDL Cholesterol   Priority: Medium     Long-Range Goal: Raise HDL Cholesterol   Start Date:  11/20/2020  Expected End Date: 11/19/2021  Recent Progress: On track  Priority: Medium  Note:   Lipid Panel     Component Value Date/Time   CHOL 102 11/09/2020 1229   TRIG 132 11/09/2020 1229   HDL 30 (L) 11/09/2020 1229   CHOLHDL 3.4 11/09/2020 1229   LDLCALC 48 11/09/2020 1229   LABVLDL 24 11/09/2020 1229  Current Barriers:  Ineffective Self Health Maintenance  Clinical Goal(s):  Collaboration with Glendale Chard, MD regarding development and update of comprehensive plan of care as evidenced by provider attestation and co-signature Inter-disciplinary care team collaboration (see longitudinal plan of care) patient will work with care management team to address care coordination and chronic disease management needs related to Disease Management Educational Needs Care Coordination Medication Management and Education Psychosocial Support   Interventions:  03/25/21 completed successful outbound call with patient  Evaluation of current treatment plan related to  low HDL Cholesterol , self-management and patient's adherence to plan as established by provider. Collaboration with Glendale Chard, MD regarding development and update of comprehensive plan of care as evidenced by provider attestation       and co-signature Inter-disciplinary care team collaboration (see longitudinal plan of care) Provided education to patient about basic disease process Review of patient status, including review of consultants reports, relevant laboratory  and other test results, and medications completed. Reviewed medications with patient and discussed importance of medication adherence Educated patient on dietary recommendations to include healthy fats in his diet; Educated on exercise recommendations, 150 minutes weekly as tolerated Confirmed patient received printed educational materials related to My Cholesterol Care Guide  Discussed plans with patient for ongoing care management follow up and provided  patient with direct contact information for care management team Self Care Activities:  Continue to keep all scheduled follow up appointments Take medications as directed  Let your healthcare team know if you are unable to take your medications Call your pharmacy for refills at least 7 days prior to running out of medication Continue to adhere to dietary and exercise recommendations  Patient Goals: - adhere to dietary and exercise recommendations  Follow Up Plan: Telephone follow up appointment with care management team member scheduled for: 08/04/21      Plan:Telephone follow up appointment with care management team member scheduled for:  08/04/21  Barb Merino, RN, BSN, CCM Care Management Coordinator [place] [facility]  Direct Phone: [phone]

## 2021-03-29 NOTE — Patient Instructions (Signed)
Visit Information  PATIENT GOALS:  Goals Addressed      Monitor and Manage My Blood Sugar-Diabetes Type 2   On track    Timeframe:  Long-Range Goal Priority:  High Start Date: 07/14/20                            Expected End Date: 07/14/21                 Follow Up Date: 08/04/21   - check blood sugar at prescribed times - check blood sugar before and after exercise - check blood sugar if I feel it is too high or too low - take the blood sugar log to all doctor visits - take the blood sugar meter to all doctor visits    Why is this important?   Checking your blood sugar at home helps to keep it from getting very high or very low.  Writing the results in a diary or log helps the doctor know how to care for you.  Your blood sugar log should have the time, date and the results.  Also, write down the amount of insulin or other medicine that you take.  Other information, like what you ate, exercise done and how you were feeling, will also be helpful.     Notes:      Raise HDL Cholesterol   On track    Timeframe:  Long-Range Goal Priority:  Medium Start Date:  11/20/20                           Expected End Date:  11/20/21      Next Scheduled Follow up date: 08/04/21  Self Goals/Self-Care Activities:  Continue to keep all scheduled follow up appointments Take medications as directed  Let your healthcare team know if you are unable to take your medications Call your pharmacy for refills at least 7 days prior to running out of medication Continue to adhere to dietary and exercise recommendations                          The patient verbalized understanding of instructions, educational materials, and care plan provided today and declined offer to receive copy of patient instructions, educational materials, and care plan.   Telephone follow up appointment with care management team member scheduled for: 08/04/21  Barb Merino, RN, BSN, CCM Care Management Coordinator [place]  [facility]  Direct Phone: [phone]

## 2021-04-01 NOTE — Patient Instructions (Signed)
Visit Information It was great speaking with you today!  Please let me know if you have any questions about our visit.   Goals Addressed             This Visit's Progress    Manage My Medicine       Timeframe:  Long-Range Goal Priority:  High Start Date:                             Expected End Date:                       Follow Up Date 07/2021 In Progress:  - call for medicine refill 2 or 3 days before it runs out - call if I am sick and can't take my medicine - keep a list of all the medicines I take; vitamins and herbals too - learn to read medicine labels - use an alarm clock or phone to remind me to take my medicine    Why is this important?   These steps will help you keep on track with your medicines.  Notes: Please bring the following to our next appointment for patient assistance: your financial information, new insurance card (if applicable)          Patient Care Plan: [place] Plan     Problem Identified: HTN, DM II   Priority: High  Onset Date: 09/29/2020     Long-Range Goal: Disease State Management   Start Date: 09/29/2020  Recent Progress: On track  Priority: High  Note:   Current Barriers:  Unable to independently monitor therapeutic efficacy  Pharmacist Clinical Goal(s):  Patient will achieve adherence to monitoring guidelines and medication adherence to achieve therapeutic efficacy through collaboration with PharmD and provider.   Interventions: 1:1 collaboration with Glendale Chard, MD regarding development and update of comprehensive plan of care as evidenced by provider attestation and co-signature Inter-disciplinary care team collaboration (see longitudinal plan of care) Comprehensive medication review performed; medication list updated in electronic medical record  Hypertension (BP goal <130/80) -Controlled -Current treatment: Olmesartan - Hydrochlorothiazide 20-12.5 mg tablet once per day  -Current home readings: none noted  at this time  -Current dietary habits: limiting the amount of salt in his food -Current exercise habits: patient reports exercising  -Denies hypotensive/hypertensive symptoms -Educated on Daily salt intake goal < 2300 mg; Exercise goal of 150 minutes per week; -Counseled to monitor BP at home three times per week, document, and provide log at future appointments -Recommended to continue current medication  Diabetes (A1c goal <7%) -Controlled -Current medications: [name] XR 50-1000 mg tablet twice per day -Medications previously tried: [place]  -Current home glucose readings fasting glucose: 114, 132, 112-118 -Denies hypoglycemic/hyperglycemic symptoms -Current meal patterns: eating more fish, rarely eating a hamburger, eating more chicken and fish   snacks: limiting the amount of snacks in his diet.  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  -Educated on A1c and blood sugar goals; Complications of diabetes including kidney damage, retinal damage, and cardiovascular disease; Exercise goal of 150 minutes per week; -Counseled to check feet daily and get yearly eye exams -Counseled on diet and exercise extensively Recommended to continue current medication  Patient Goals/Self-Care Activities Patient will:  - take medications as prescribed  Follow Up Plan: Telephone follow up appointment with care management team member  scheduled for: 07/22/2021 The patient has been provided with contact information for the care management team and has been advised to call with any health related questions or concerns.        Patient agreed to services and verbal consent obtained.   The patient verbalized understanding of instructions, educational materials, and care plan provided today and agreed to receive a mailed copy of patient instructions, educational materials, and care plan.   Andres [name],  PharmD Clinical Pharmacist [facility] [phone]

## 2021-04-09 DIAGNOSIS — E7800 Pure hypercholesterolemia, unspecified: Secondary | ICD-10-CM

## 2021-04-09 DIAGNOSIS — N182 Chronic kidney disease, stage 2 (mild): Secondary | ICD-10-CM | POA: Diagnosis not present

## 2021-04-09 DIAGNOSIS — Z794 Long term (current) use of insulin: Secondary | ICD-10-CM

## 2021-04-09 DIAGNOSIS — I129 Hypertensive chronic kidney disease with stage 1 through stage 4 chronic kidney disease, or unspecified chronic kidney disease: Secondary | ICD-10-CM

## 2021-04-09 DIAGNOSIS — I10 Essential (primary) hypertension: Secondary | ICD-10-CM | POA: Diagnosis not present

## 2021-04-09 DIAGNOSIS — E1122 Type 2 diabetes mellitus with diabetic chronic kidney disease: Secondary | ICD-10-CM

## 2021-06-16 ENCOUNTER — Telehealth: Payer: Self-pay

## 2021-06-16 NOTE — Chronic Care Management (AMB) (Addendum)
Chronic Care Management Pharmacy Assistant   Name: Andres [name]  MRN: [id number] DOB: October 19, 1950  Reason for Encounter: Disease State/ Diabetes  Recent office visits:  03-25-2021 Lynne Logan, RN (CCM)  Recent consult visits:  None  Hospital visits:  None in previous 6 months  Medications: Outpatient Encounter Medications as of 06/16/2021  Medication Sig Note   Accu-Chek FastClix Lancets MISC One tab po qd dx; e11.65    ACCU-CHEK GUIDE test strip Use to check BS qd dx: E11.65    Arginine 500 MG CAPS Take by mouth. Daily    aspirin 81 MG tablet Take 81 mg by mouth daily.    cholecalciferol (VITAMIN D) 1000 UNITS tablet Take 4,000 Units by mouth daily.    Cyanocobalamin (VITAMIN B 12 PO) Take 1 capsule by mouth. 2081mcg daily 12/02/2020: Patient not taking at this time until he completes his Vitamin B 12 injection series.   fluticasone (CUTIVATE) 0.05 % cream Apply topically 2 (two) times daily.    [name] XR 50-1000 MG TB24 Take 1 tablet by mouth 2 (two) times daily.    levocetirizine (XYZAL) 5 MG tablet Take 1 tablet (5 mg total) by mouth every evening.    Multiple Vitamin (MULTIVITAMIN) tablet Take 1 tablet by mouth daily.    olmesartan-hydrochlorothiazide (BENICAR HCT) 20-12.5 MG tablet Take 1 tablet by mouth daily.    rosuvastatin (CRESTOR) 10 MG tablet One tab po qd    tadalafil (CIALIS) 5 MG tablet TAKE 1 TABLET BY MOUTH ONCE A DAY.    Facility-Administered Encounter Medications as of 06/16/2021  Medication   cyanocobalamin ((VITAMIN B-12)) injection 1,000 mcg  Recent Relevant Labs: Lab Results  Component Value Date/Time   HGBA1C 6.7 (H) 03/22/2021 10:15 AM   HGBA1C 7.0 (H) 11/09/2020 12:22 PM   HGBA1C 7.7 02/13/2018 12:00 AM   MICROALBUR 30 11/07/2019 11:30 AM   MICROALBUR 10 10/30/2018 10:29 AM    Kidney Function Lab Results  Component Value Date/Time   CREATININE 1.17 03/22/2021 10:15 AM   CREATININE 1.24 11/09/2020 12:22 PM   GFRNONAA 62 06/29/2020  03:13 PM   GFRAA 71 06/29/2020 03:13 PM    Current antihyperglycemic regimen:  [name] XR 50-1000 mg twice daily  What recent interventions/DTPs have been made to improve glycemic control:  Educated on A1c and blood sugar goals; Complications of diabetes including kidney damage, retinal damage, and cardiovascular disease; Exercise goal of 150 minutes per week; -Counseled to check feet daily and get yearly eye exams -Counseled on diet and exercise extensively Recommended to continue current medication  Have there been any recent hospitalizations or ED visits since last visit with CPP? No  Patient denies hypoglycemic symptoms  Patient denies hyperglycemic symptoms  How often are you checking your blood sugar? twice daily  What are your blood sugars ranging?  Fasting: 116 Before meals: None After meals: 134 Bedtime: None  During the week, how often does your blood glucose drop below 70? Never  Are you checking your feet daily/regularly? Daily  Adherence Review: Is the patient currently on a STATIN medication? Yes Is the patient currently on ACE/ARB medication? Yes Does the patient have >5 day gap between last estimated fill dates? Yes  NOTES: Patient stated he spoke with [name] on 06-15-2021 and dropped patient assistance paper work off. Patient was told his appointment in January can be a telephone appointment instead of face to face. Will clarify before changing .Asked patient about his last fill on Rosuvastatin and was told  [name] instructed him to take 1 tablet daily instead of 2 so he still has about 15 tablets left. Called patient's pharmacy to request a refill for medication.  Care Gaps: PNA Vac overdue Shingrix overdue 3rd moderna vaccine overdue AWV 11-24-2021  Star Rating Drugs: Januemet XR 50- 1000 mg- Patient assistance Rosuvastatin 10 mg- Last filled 11-09-2020 90 DS [facility] Olmesartan-HCTZ 20-12.5 mg- Last filled 04-27-2021 90 DS  [place] Clinical Pharmacist Assistant [phone]

## 2021-06-28 DIAGNOSIS — E119 Type 2 diabetes mellitus without complications: Secondary | ICD-10-CM | POA: Diagnosis not present

## 2021-06-28 LAB — HM DIABETES EYE EXAM

## 2021-07-07 ENCOUNTER — Telehealth: Payer: Self-pay

## 2021-07-07 NOTE — Chronic Care Management (AMB) (Signed)
° ° °  Chronic Care Management Pharmacy Assistant   Name: NARADA UZZLE  MRN: [id number] DOB: 1951-03-12  Reason for Encounter: Reschedule appointment    Medications: Outpatient Encounter Medications as of 07/07/2021  Medication Sig Note   Accu-Chek FastClix Lancets MISC One tab po qd dx; e11.65    ACCU-CHEK GUIDE test strip Use to check BS qd dx: E11.65    Arginine 500 MG CAPS Take by mouth. Daily    aspirin 81 MG tablet Take 81 mg by mouth daily.    cholecalciferol (VITAMIN D) 1000 UNITS tablet Take 4,000 Units by mouth daily.    Cyanocobalamin (VITAMIN B 12 PO) Take 1 capsule by mouth. 2042mcg daily 12/02/2020: Patient not taking at this time until he completes his Vitamin B 12 injection series.   fluticasone (CUTIVATE) 0.05 % cream Apply topically 2 (two) times daily.    [name] XR 50-1000 MG TB24 Take 1 tablet by mouth 2 (two) times daily.    levocetirizine (XYZAL) 5 MG tablet Take 1 tablet (5 mg total) by mouth every evening.    Multiple Vitamin (MULTIVITAMIN) tablet Take 1 tablet by mouth daily.    olmesartan-hydrochlorothiazide (BENICAR HCT) 20-12.5 MG tablet Take 1 tablet by mouth daily.    rosuvastatin (CRESTOR) 10 MG tablet One tab po qd    tadalafil (CIALIS) 5 MG tablet TAKE 1 TABLET BY MOUTH ONCE A DAY.    Facility-Administered Encounter Medications as of 07/07/2021  Medication   cyanocobalamin ((VITAMIN B-12)) injection 1,000 mcg   07-07-2021: Called patient to reschedule telephone appointment with [name] CPP on 07-22-2020. Left patient a voicemail to reschedule.   [place] Pharmacist Assistant [phone]

## 2021-07-20 ENCOUNTER — Telehealth: Payer: Self-pay

## 2021-07-22 ENCOUNTER — Ambulatory Visit: Payer: Medicare HMO

## 2021-07-27 ENCOUNTER — Ambulatory Visit (INDEPENDENT_AMBULATORY_CARE_PROVIDER_SITE_OTHER): Payer: Medicare HMO

## 2021-07-27 DIAGNOSIS — E7800 Pure hypercholesterolemia, unspecified: Secondary | ICD-10-CM

## 2021-07-27 DIAGNOSIS — E1122 Type 2 diabetes mellitus with diabetic chronic kidney disease: Secondary | ICD-10-CM

## 2021-07-27 NOTE — Progress Notes (Signed)
Chronic Care Management Pharmacy Note  07/27/2021 Name:  Andres [name] MRN:  [id number] DOB:  26-Nov-1950  Summary: Patient reports that he has been doing well and checking his BS on a consistent basis.   Recommendations/Changes made from today's visit: Recommend the use of CGM for ease of use and the ability to check BS quickly. Recommend increasing the amount of water per day he is drinking by 16 ounces.   Plan: Patient would like to try CGM for BS monitoring.  He is going to increase the amount of water he is drinking.    Subjective: Andres [name] is an 71 y.o. year old male who is a primary patient of Glendale Chard, MD.  The CCM team was consulted for assistance with disease management and care coordination needs.    Engaged with patient face to face for follow up visit in response to provider referral for pharmacy case management and/or care coordination services.   Consent to Services:  The patient was given information about Chronic Care Management services, agreed to services, and gave verbal consent prior to initiation of services.  Please see initial visit note for detailed documentation.   Patient Care Team: Glendale Chard, MD as PCP - General (Internal Medicine) Harl Bowie Alphonse Guild, MD as PCP - Cardiology (Cardiology) Rex Kras Claudette Stapler, RN as Case Manager Mayford Knife, [facility] (Pharmacist)  Recent office visits: 04/09/2021 PCP OV  03/22/2021 PCP OV  Recent consult visits: None in the past 6 months   Hospital visits: None in previous 6 months   Objective:  Lab Results  Component Value Date   CREATININE 1.17 03/22/2021   BUN 13 03/22/2021   GFRNONAA 62 06/29/2020   GFRAA 71 06/29/2020   NA 137 03/22/2021   K 4.4 03/22/2021   CALCIUM 9.3 03/22/2021   CO2 22 03/22/2021   GLUCOSE 141 (H) 03/22/2021    Lab Results  Component Value Date/Time   HGBA1C 6.7 (H) 03/22/2021 10:15 AM   HGBA1C 7.0 (H) 11/09/2020 12:22 PM   HGBA1C 7.7 02/13/2018 12:00 AM    MICROALBUR 30 11/07/2019 11:30 AM   MICROALBUR 10 10/30/2018 10:29 AM    Last diabetic Eye exam:  Lab Results  Component Value Date/Time   HMDIABEYEEXA No Retinopathy 06/25/2020 12:00 AM    Last diabetic Foot exam: No results found for: HMDIABFOOTEX   Lab Results  Component Value Date   CHOL 102 11/09/2020   HDL 30 (L) 11/09/2020   LDLCALC 48 11/09/2020   TRIG 132 11/09/2020   CHOLHDL 3.4 11/09/2020    Hepatic Function Latest Ref Rng & Units 11/09/2020 06/29/2020 11/07/2019  Total Protein 6.0 - 8.5 g/dL 7.4 7.8 8.0  Albumin 3.8 - 4.8 g/dL 4.5 4.6 4.5  AST 0 - 40 IU/L _0 ALT 0 - 44 IU/L _1 Alk Phosphatase 44 - 121 IU/L 57 64 58  Total Bilirubin 0.0 - 1.2 mg/dL 0.3 0.3 0.5  Bilirubin, Direct 0.00 - 0.40 mg/dL - - -    Lab Results  Component Value Date/Time   TSH 2.260 11/09/2020 12:22 PM   TSH 2.410 06/29/2020 03:13 PM    CBC Latest Ref Rng & Units 11/09/2020 11/07/2019 10/30/2018  WBC 3.4 - 10.8 x10E3/uL 7.7 8.2 7.4  Hemoglobin 13.0 - 17.7 g/dL 13.3 13.7 13.6  Hematocrit 37.5 - 51.0 % 39.7 41.1 40.0  Platelets 150 - 450 x10E3/uL 216 222 199    No results found for: VD25OH  Clinical ASCVD: No  The ASCVD Risk score (Arnett DK, et al., 2019) failed to calculate for the following reasons:   The valid total cholesterol range is 130 to 320 mg/dL    Depression screen [facility] 2/9 11/12/2020 11/09/2020 11/07/2019  Decreased Interest 0 0 0  Down, Depressed, Hopeless 0 0 0  PHQ - 2 Score 0 0 0  Altered sleeping - - 0  Tired, decreased energy - - 0  Change in appetite - - 0  Feeling bad or failure about yourself  - - 0  Trouble concentrating - - 0  Moving slowly or fidgety/restless - - 0  Suicidal thoughts - - 0  PHQ-9 Score - - 0  Difficult doing work/chores - - Not difficult at all     Social History   Tobacco Use  Smoking Status Former   Packs/day: 0.50   Years: 22.00   Pack years: 11.00   Types: Cigarettes   Quit date: 07/11/2011   Years since quitting:  10.0  Smokeless Tobacco Never  Tobacco Comments   he has quit   BP Readings from Last 3 Encounters:  03/22/21 124/80  12/08/20 134/72  12/01/20 134/70   Pulse Readings from Last 3 Encounters:  03/22/21 85  12/08/20 76  12/01/20 88   Wt Readings from Last 3 Encounters:  03/22/21 232 lb 3.2 oz (105.3 kg)  12/08/20 234 lb 6.4 oz (106.3 kg)  12/01/20 234 lb (106.1 kg)   BMI Readings from Last 3 Encounters:  03/22/21 32.20 kg/m  12/08/20 32.33 kg/m  12/01/20 32.27 kg/m    Assessment/Interventions: Review of patient past medical history, allergies, medications, health status, including review of consultants reports, laboratory and other test data, was performed as part of comprehensive evaluation and provision of chronic care management services.   SDOH:  (Social Determinants of Health) assessments and interventions performed: No  SDOH Screenings   Alcohol Screen: Not on file  Depression (PHQ2-9): Low Risk    PHQ-2 Score: 0  Financial Resource Strain: Low Risk    Difficulty of Paying Living Expenses: Not hard at all  Food Insecurity: No Food Insecurity   Worried About Charity fundraiser in the Last Year: Never true   Ran Out of Food in the Last Year: Never true  Housing: Not on file  Physical Activity: Sufficiently Active   Days of Exercise per Week: 3 days   Minutes of Exercise per Session: 60 min  Social Connections: Not on file  Stress: No Stress Concern Present   Feeling of Stress : Not at all  Tobacco Use: Medium Risk   Smoking Tobacco Use: Former   Smokeless Tobacco Use: Never   Passive Exposure: Not on file  Transportation Needs: No Transportation Needs   Lack of Transportation (Medical): No   Lack of Transportation (Non-Medical): No    CCM Care Plan  No Known Allergies  Medications Reviewed Today     Reviewed by Mayford Knife, [facility] (Pharmacist) on 07/27/21 at [place]  Med List Status: <None>   Medication Order Taking? Sig Documenting Provider Last  Dose Status Informant  Accu-Chek FastClix Lancets MISC [id number] No One tab po qd dx; e11.65 Glendale Chard, MD Taking Active   ACCU-CHEK GUIDE test strip [id number]  Use to check BS qd dx: E11.65 Glendale Chard, MD  Active   Arginine 500 MG CAPS [id number] No Take by mouth. Daily [provider] Taking Active Self  aspirin 81 MG tablet [id number] No Take 81 mg by mouth daily. [provider] Taking  Active   cholecalciferol (VITAMIN D) 1000 UNITS tablet [id number] No Take 4,000 Units by mouth daily. [provider] Taking Active   cyanocobalamin ((VITAMIN B-12)) injection 1,000 mcg [id number]   Glendale Chard, MD  Active   Cyanocobalamin (VITAMIN B 12 PO) [id number] No Take 1 capsule by mouth. 2014mg daily [provider] Taking Active            Med Note (Jeoffrey MassedMay 25, 2022 11:46 AM) Patient not taking at this time until he completes his Vitamin B 12 injection series.  fluticasone (CUTIVATE) 0.05 % cream [id number] Apply topically 2 (two) times daily. SGlendale Chard MD Taking Active   [name] XR 50-1000 MG TB24 [id number] Take 1 tablet by mouth 2 (two) times daily. SGlendale Chard MD  Expired 06/22/21 2359   levocetirizine (XYZAL) 5 MG tablet [id number] Take 1 tablet (5 mg total) by mouth every evening. SGlendale Chard MD Taking Active   Multiple Vitamin (MULTIVITAMIN) tablet [id number] Take 1 tablet by mouth daily. [provider] Taking Active   olmesartan-hydrochlorothiazide (BENICAR HCT) 20-12.5 MG tablet [id number] Take 1 tablet by mouth daily. SGlendale Chard MD Taking Active   rosuvastatin (CRESTOR) 10 MG tablet [id number] One tab po qd SGlendale Chard MD Taking Active   tadalafil (CIALIS) 5 MG tablet [id number] TAKE 1 TABLET BY MOUTH ONCE A DAY. SGlendale Chard MD Taking Active             Patient Active Problem List   Diagnosis Date Noted   Paresthesias 11/22/2020   Benign prostatic hyperplasia with urinary  frequency 11/22/2020   Class 1 obesity due to excess calories with serious comorbidity and body mass index (BMI) of 33.0 to 33.9 in adult 11/22/2020   Type 2 diabetes mellitus with stage 2 chronic kidney disease, with long-term current use of insulin ([place] 07/23/2018   Pure hypercholesterolemia 07/23/2018   HTN (hypertension) 07/23/2018   Hypertensive nephropathy 02/13/2018    Immunization History  Administered Date(s) Administered   Fluad Quad(high Dose 65+) 05/08/2020, 03/22/2021   Influenza, High Dose Seasonal PF 04/11/2018, 04/01/2019   Moderna SARS-COV2 Booster Vaccination 05/22/2020   Moderna Sars-Covid-2 Vaccination 08/17/2019, 09/17/2019   Pneumococcal Polysaccharide-23 07/23/2018   Tdap 04/17/2019    Conditions to be addressed/monitored:  Hyperlipidemia and Diabetes  Care Plan : [place] Updates made by PMayford Knife [facility] 07/27/2021 12:00 AM     Problem: HLD, DM II   Priority: High  Onset Date: 09/29/2020     Long-Range Goal: Disease State Management   Start Date: 09/29/2020  Recent Progress: On track  Priority: High  Note:      Current Barriers:  Unable to independently afford treatment regimen Unable to independently monitor therapeutic efficacy  Pharmacist Clinical Goal(s):  Patient will achieve adherence to monitoring guidelines and medication adherence to achieve therapeutic efficacy through collaboration with PharmD and provider.   Interventions: 1:1 collaboration with SGlendale Chard MD regarding development and update of comprehensive plan of care as evidenced by provider attestation and co-signature Inter-disciplinary care team collaboration (see longitudinal plan of care) Comprehensive medication review performed; medication list updated in electronic medical record  Hyperlipidemia: (LDL goal < 70) -Controlled -Current treatment: Rosuvastatin 10 mg tablet once per day Appropriate, Effective, Safe, Accessible Aspirin 81 mg  tablet daily Appropriate, Effective, Safe, Accessible -Current dietary patterns: not eating a lot of fried fatty foods  -Current exercise habits: He is making sure to  walk plenty, and he is using his phone to track his steps and exercise.  -Educated on Benefits of statin for ASCVD risk reduction; Importance of limiting foods high in cholesterol; -Recommended to continue current medication  Diabetes (A1c goal <7%) -Controlled -Current medications: [name] XR 50-1000 mg tablet twice per day, after he eats Appropriate, Effective, Safe, Accessible -Current home glucose readings: sometimes its a struggle to check BS through the day, he tries to check it twice per day in the morning  fasting glucose: 1/16- 110 , 89  post prandial glucose: 1/10 -217, he checked his BS again it had come down -Reports hypoglycemic/hyperglycemic symptoms -Current meal patterns: he is no longer eating hamburgers, he is eating more chicken and likes cooking turnip greens an collard greens, he is reducing the amount of bread that he is eating. He is eating pillsbury biscuits at least once a week with sausage -If he has a place to be in the morning he has a  pack of Nabs  -Fruits: Mr. 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   -Educated on A1c and blood sugar goals; Benefits of routine self-monitoring of blood sugar; Continuous glucose monitoring; -Congratulated patient on meeting his A1c goals  -He would like to use CGM, because he will be able to monitor it more frequently  -Counseled to check feet daily and get yearly eye exams -Recommended to continue  current medication Counseled on the options of CGM and will collaborate with PCP.   Patient Goals/Self-Care Activities Patient will:  - take medications as prescribed as evidenced by patient report and record review collaborate with provider on medication access solutions  Follow Up Plan: The patient has been provided with contact information for the care management team and has been advised to call with any health related questions or concerns.       Medication Assistance: Application for [name]   medication assistance program. in process.  Anticipated assistance start date 08/2021.  See plan of care for additional detail.  Compliance/Adherence/Medication fill history: Care Gaps: Shingrix- Patient received first vaccine 04/29/2021 Pneumonia Vaccine - Pneumococcal 23 - completed in 2020, updated NCIR to reflect administration   Star-Rating Drugs: [name] XR 50-1000 mg tablet Olmesartan-Hydrochlorothiazide xx  Patient's preferred pharmacy is:  [facility], [place] [address] Phone: [phone] Fax: [phone]  [place], [place] [address] [place] [address] Phone: [phone] Fax: [phone]  [facility] [address] [place] - [address] [place] [address] [place] [address] Phone: [phone] Fax: [phone]  Uses pill box? Yes Pt endorses 95% compliance  We discussed: Benefits of medication synchronization, packaging and delivery as well as enhanced pharmacist oversight with [employer]. Patient decided to: Continue current medication management strategy  Care Plan and Follow Up Patient Decision:  Patient agrees to Care Plan and Follow-up.  Plan: The patient has been provided with contact information for the care management team and has been advised to call  with any health related questions or concerns.   [name], CPP, PharmD Clinical  Pharmacist Practitioner [facility] [phone]

## 2021-07-27 NOTE — Patient Instructions (Addendum)
Visit Information It was great speaking with you today!  Please let me know if you have any questions about our visit.   Goals Addressed             This Visit's Progress    Set My Target A1C-Diabetes Type 2       Timeframe:  Long-Range Goal Priority:  High Start Date:                             Expected End Date:                       Follow Up Date 01/25/2022    - set target A1C: Less than 7   Why is this important?   Your target A1C is decided together by you and your doctor.  It is based on several things like your age and other health issues.    Notes:  -Patient would like to start process of using CGM to check blood sugar -Patient assistance application pending for [name]        Patient Care Plan: CCM Pharmacy Care Plan     Problem Identified: HLD, DM II   Priority: High  Onset Date: 09/29/2020     Long-Range Goal: Disease State Management   Start Date: 09/29/2020  Recent Progress: On track  Priority: High  Note:      Current Barriers:  Unable to independently afford treatment regimen Unable to independently monitor therapeutic efficacy  Pharmacist Clinical Goal(s):  Patient will achieve adherence to monitoring guidelines and medication adherence to achieve therapeutic efficacy through collaboration with PharmD and provider.   Interventions: 1:1 collaboration with Glendale Chard, MD regarding development and update of comprehensive plan of care as evidenced by provider attestation and co-signature Inter-disciplinary care team collaboration (see longitudinal plan of care) Comprehensive medication review performed; medication list updated in electronic medical record  Hyperlipidemia: (LDL goal < 70) -Controlled -Current treatment: Rosuvastatin 10 mg tablet once per day Appropriate, Effective, Safe, Accessible Aspirin 81 mg tablet daily Appropriate, Effective, Safe, Accessible -Current dietary patterns: not eating a lot of fried fatty foods   -Current exercise habits: He is making sure to walk plenty, and he is using his phone to track his steps and exercise.  -Educated on Benefits of statin for ASCVD risk reduction; Importance of limiting foods high in cholesterol; -Recommended to continue current medication  Diabetes (A1c goal <7%) -Controlled -Current medications: [name] XR 50-1000 mg tablet twice per day, after he eats Appropriate, Effective, Safe, Accessible  (He currently has 2 month supply left from last year refill through patient assistance.  -Current home glucose readings: sometimes its a struggle to check BS through the day, he tries to check it twice per day in the morning  fasting glucose: 1/16- 110 , 89  post prandial glucose: 1/10 -217, he checked his BS again it had come down -Reports hypoglycemic/hyperglycemic symptoms -Current meal patterns: he is no longer eating hamburgers, he is eating more chicken and likes cooking turnip greens an collard greens, he is reducing the amount of bread that he is eating. He is eating pillsbury biscuits at least once a week with sausage -If he has a place to be in the morning he has a  pack of Nabs  -Fruits: Mr. 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   -Educated on A1c and blood sugar goals; Benefits of routine self-monitoring of blood sugar; Continuous glucose monitoring; -Congratulated patient on meeting his A1c goals  -He would like to use CGM, because he will be able to monitor it more frequently  -Counseled to check feet daily and get yearly eye exams -Patient brought in patient assistance paperwork that  includes Attestation form of need. Patient signed and dated attestation form.  -Recommended to continue current medication Counseled on the options of CGM and will collaborate with PCP for patient.   Patient Goals/Self-Care Activities Patient will:  - take medications as prescribed as evidenced by patient report and record review collaborate with provider on medication access solutions  Follow Up Plan: The patient has been provided with contact information for the care management team and has been advised to call with any health related questions or concerns.        Patient agreed to services and verbal consent obtained.   The patient verbalized understanding of instructions, educational materials, and care plan provided today and agreed to receive a mailed copy of patient instructions, educational materials, and care plan.   [name], PharmD Clinical Pharmacist [facility] [phone]

## 2021-08-02 ENCOUNTER — Ambulatory Visit (INDEPENDENT_AMBULATORY_CARE_PROVIDER_SITE_OTHER): Payer: Medicare HMO | Admitting: Internal Medicine

## 2021-08-02 ENCOUNTER — Other Ambulatory Visit: Payer: Self-pay

## 2021-08-02 ENCOUNTER — Encounter: Payer: Self-pay | Admitting: Internal Medicine

## 2021-08-02 VITALS — BP 122/66 | HR 101 | Temp 98.0°F | Ht 71.2 in | Wt 228.6 lb

## 2021-08-02 DIAGNOSIS — Z794 Long term (current) use of insulin: Secondary | ICD-10-CM

## 2021-08-02 DIAGNOSIS — N182 Chronic kidney disease, stage 2 (mild): Secondary | ICD-10-CM

## 2021-08-02 DIAGNOSIS — E1122 Type 2 diabetes mellitus with diabetic chronic kidney disease: Secondary | ICD-10-CM

## 2021-08-02 DIAGNOSIS — I129 Hypertensive chronic kidney disease with stage 1 through stage 4 chronic kidney disease, or unspecified chronic kidney disease: Secondary | ICD-10-CM | POA: Diagnosis not present

## 2021-08-02 DIAGNOSIS — Z23 Encounter for immunization: Secondary | ICD-10-CM | POA: Diagnosis not present

## 2021-08-02 DIAGNOSIS — Z6831 Body mass index (BMI) 31.0-31.9, adult: Secondary | ICD-10-CM

## 2021-08-02 DIAGNOSIS — R000 Tachycardia, unspecified: Secondary | ICD-10-CM

## 2021-08-02 DIAGNOSIS — E6609 Other obesity due to excess calories: Secondary | ICD-10-CM | POA: Diagnosis not present

## 2021-08-02 MED ORDER — ROSUVASTATIN CALCIUM 10 MG PO TABS: ORAL_TABLET | ORAL | 2 refills | Status: DC

## 2021-08-02 NOTE — Patient Instructions (Signed)

## 2021-08-02 NOTE — Progress Notes (Signed)
I,Katawbba Wiggins,acting as a Education administrator for [name], MD.,have documented all relevant documentation on the behalf of [name], MD,as directed by  [name], MD while in the presence of [name], MD.  This visit occurred during the SARS-CoV-2 public health emergency.  Safety protocols were in place, including screening questions prior to the visit, additional usage of staff PPE, and extensive cleaning of exam room while observing appropriate contact time as indicated for disinfecting solutions.  Subjective:     Patient ID: Andres [name] , male    DOB: March 27, 1951 , 71 y.o.   MRN: [id number]   Chief Complaint  Patient presents with   Diabetes   Hypertension    HPI  He presents for DM/HTN follow-up. He reports compliance with meds. He denies headaches, chest pain and shortness of breath.  He states he is still exercising "a little bit". He does not wish to go to the gym yet, he is apprehensive due to [place]. The patient stated sugar 118 this morning fasting, and 114 before bedtime last night.  Diabetes He presents for his follow-up diabetic visit. He has type 2 diabetes mellitus. His disease course has been stable. There are no hypoglycemic associated symptoms. Pertinent negatives for diabetes include no blurred vision and no chest pain. There are no hypoglycemic complications. Diabetic complications include nephropathy. Risk factors for coronary artery disease include diabetes mellitus, dyslipidemia, hypertension and male sex. He is following a diabetic diet. His breakfast blood glucose is taken between 8-9 am. His breakfast blood glucose range is generally 90-110 mg/dl. An ACE inhibitor/angiotensin II receptor blocker is being taken. Eye exam is not current.  Hypertension This is a chronic problem. The current episode started more than 1 year ago. The problem has been gradually improving since onset. The problem is controlled. Pertinent negatives include no blurred  vision, chest pain, palpitations or shortness of breath. Past treatments include angiotensin blockers and diuretics. The current treatment provides moderate improvement.    Past Medical History:  Diagnosis Date   Diabetes ([place])    High cholesterol    Hypertension    states under control with med., has been on med. x 5 yr.   Seasonal allergies    Umbilical hernia 08/6201     Family History  Problem Relation Age of Onset   Healthy Mother    Early death Father      Current Outpatient Medications:    Accu-Chek FastClix Lancets MISC, One tab po qd dx; e11.65, Disp: 100 each, Rfl: 11   ACCU-CHEK GUIDE test strip, Use to check BS qd dx: E11.65, Disp: 100 each, Rfl: 11   Arginine 500 MG CAPS, Take by mouth. Daily, Disp: , Rfl:    aspirin 81 MG tablet, Take 81 mg by mouth daily., Disp: , Rfl:    cholecalciferol (VITAMIN D) 1000 UNITS tablet, Take 4,000 Units by mouth daily., Disp: , Rfl:    fluticasone (CUTIVATE) 0.05 % cream, Apply topically 2 (two) times daily., Disp: 60 g, Rfl: 2   [name] XR 50-1000 MG TB24, Take 1 tablet by mouth 2 (two) times daily., Disp: 180 tablet, Rfl: 1   levocetirizine (XYZAL) 5 MG tablet, Take 1 tablet (5 mg total) by mouth every evening., Disp: 90 tablet, Rfl: 2   Multiple Vitamin (MULTIVITAMIN) tablet, Take 1 tablet by mouth daily., Disp: , Rfl:    olmesartan-hydrochlorothiazide (BENICAR HCT) 20-12.5 MG tablet, Take 1 tablet by mouth daily., Disp: 90 tablet, Rfl: 2   tadalafil (  CIALIS) 5 MG tablet, TAKE 1 TABLET BY MOUTH ONCE A DAY., Disp: 30 tablet, Rfl: 2   rosuvastatin (CRESTOR) 10 MG tablet, One tab po qd, Disp: 90 tablet, Rfl: 2   No Known Allergies   Review of Systems  Constitutional: Negative.   Eyes:  Negative for blurred vision.  Respiratory: Negative.  Negative for shortness of breath.   Cardiovascular: Negative.  Negative for chest pain and palpitations.  Gastrointestinal: Negative.   Psychiatric/Behavioral: Negative.    All other systems  reviewed and are negative.   Today's Vitals   08/02/21 1051  BP: 122/66  Pulse: (!) 101  Temp: 98 F (36.7 C)  Weight: 228 lb 10.1 oz (103.7 kg)  Height: 5' 11.2" (1.808 m)  PainSc: 0-No pain   Body mass index is 31.71 kg/m.  Wt Readings from Last 3 Encounters:  08/02/21 228 lb 10.1 oz (103.7 kg)  03/22/21 232 lb 3.2 oz (105.3 kg)  12/08/20 234 lb 6.4 oz (106.3 kg)    BP Readings from Last 3 Encounters:  08/02/21 122/66  03/22/21 124/80  12/08/20 134/72    Objective:  Physical Exam Vitals and nursing note reviewed.  Constitutional:      Appearance: Normal appearance.  HENT:     Head: Normocephalic and atraumatic.     Nose:     Comments: Masked     Mouth/Throat:     Comments: Masked  Eyes:     Extraocular Movements: Extraocular movements intact.  Cardiovascular:     Rate and Rhythm: Normal rate and regular rhythm.     Heart sounds: Normal heart sounds.  Pulmonary:     Effort: Pulmonary effort is normal.     Breath sounds: Normal breath sounds.  Musculoskeletal:     Cervical back: Normal range of motion.  Skin:    General: Skin is warm.  Neurological:     General: No focal deficit present.     Mental Status: He is alert.  Psychiatric:        Mood and Affect: Mood normal.        Assessment And Plan:     1. Type 2 diabetes mellitus with stage 2 chronic kidney disease, with long-term current use of insulin (HCC) Comments: Chronic, I will check labs as listed below. I will adjust meds as needed. He will rto in May 2023 for his next physical examination.  - Hemoglobin A1c - CMP14+EGFR  2. Hypertensive nephropathy Comments: Chronic, well controlled. He is encouraged to follow low sodium diet.   3. Tachycardia Comments: He is encouraged to stay well hydrated. He denies palpitations. May benefit from Mg supplementation. I will reassess at his next visit.  - Magnesium  4. Class 1 obesity due to excess calories with serious comorbidity and body mass index  (BMI) of 31.0 to 31.9 in adult Comments:  He is encouraged to initially strive for BMI less than 30 to decrease cardiac risk. He is advised to exercise no less than 150 minutes per week.    5. Need for vaccination - Pneumococcal conjugate vaccine 20-valent (Prevnar 20)  Patient was given opportunity to ask questions. Patient verbalized understanding of the plan and was able to repeat key elements of the plan. All questions were answered to their satisfaction.   I, [name], MD, have reviewed all documentation for this visit. The documentation on 08/02/21 for the exam, diagnosis, procedures, and orders are all accurate and complete.   IF YOU HAVE BEEN REFERRED TO A SPECIALIST, IT  MAY TAKE 1-2 WEEKS TO SCHEDULE/PROCESS THE REFERRAL. IF YOU HAVE NOT HEARD FROM US/SPECIALIST IN TWO WEEKS, PLEASE GIVE [place] A CALL AT [phone].   THE PATIENT IS ENCOURAGED TO PRACTICE SOCIAL DISTANCING DUE TO THE COVID-19 PANDEMIC.

## 2021-08-03 LAB — CMP14+EGFR
ALT: 26 IU/L (ref 0–44)
AST: 25 IU/L (ref 0–40)
Albumin/Globulin Ratio: 1.6 (ref 1.2–2.2)
Albumin: 4.5 g/dL (ref 3.8–4.8)
Alkaline Phosphatase: 59 IU/L (ref 44–121)
BUN/Creatinine Ratio: 11 (ref 10–24)
BUN: 13 mg/dL (ref 8–27)
Bilirubin Total: 0.3 mg/dL (ref 0.0–1.2)
CO2: 22 mmol/L (ref 20–29)
Calcium: 9.5 mg/dL (ref 8.6–10.2)
Chloride: 103 mmol/L (ref 96–106)
Creatinine, Ser: 1.2 mg/dL (ref 0.76–1.27)
Globulin, Total: 2.9 g/dL (ref 1.5–4.5)
Glucose: 169 mg/dL — ABNORMAL HIGH (ref 70–99)
Potassium: 4.2 mmol/L (ref 3.5–5.2)
Sodium: 141 mmol/L (ref 134–144)
Total Protein: 7.4 g/dL (ref 6.0–8.5)
eGFR: 66 mL/min/{1.73_m2} (ref >59)

## 2021-08-03 LAB — HEMOGLOBIN A1C
Est. average glucose Bld gHb Est-mCnc: 140 mg/dL
Hgb A1c MFr Bld: 6.5 % — ABNORMAL HIGH (ref 4.8–5.6)

## 2021-08-03 LAB — MAGNESIUM: Magnesium: 1.5 mg/dL — ABNORMAL LOW (ref 1.6–2.3)

## 2021-08-04 ENCOUNTER — Telehealth: Payer: Self-pay

## 2021-08-04 ENCOUNTER — Telehealth: Payer: Medicare HMO

## 2021-08-04 NOTE — Telephone Encounter (Signed)
°  Care Management   Follow Up Note   08/04/2021 Name: Andres [name] MRN: [id number] DOB: Jan 16, 1951   Referred by: Glendale Chard, MD Reason for referral : Chronic Care Management (RN CM Follow up call )   An unsuccessful telephone outreach was attempted today. The patient was referred to the case management team for assistance with care management and care coordination.   Follow Up Plan: Telephone follow up appointment with care management team member scheduled for: 08/23/21  Barb Merino, RN, BSN, CCM Care Management Coordinator [place] [facility]  Direct Phone: [phone]

## 2021-08-05 ENCOUNTER — Encounter: Payer: Self-pay | Admitting: Internal Medicine

## 2021-08-09 ENCOUNTER — Telehealth: Payer: Self-pay

## 2021-08-09 NOTE — Chronic Care Management (AMB) (Signed)
° ° °  Chronic Care Management Pharmacy Assistant   Name: Andres [name]  MRN: [id number] DOB: 1951-05-10   Reason for Encounter: Patient Assistance Coordination   08/09/2021- Continuous glucose monitor recommened by clinical pharmacist, patient insurance requires PA on all CGM monitors, attempted to do Prior authorization through Covermymeds, need to call [employer] at [phone] for PA. Called [employer], was transferred to [facility]. Spoke with Shanon Brow at [facility] who transferred me to [facility]- w/ Clinical review department. Per [place], CGM is excluded from Part D coverage, will need authorization through [place] out Medicare Standard order or patient to receive Freestyle Libre 2, awaiting provider signature and will fax.    Medications: Outpatient Encounter Medications as of 08/09/2021  Medication Sig   Accu-Chek FastClix Lancets MISC One tab po qd dx; e11.65   ACCU-CHEK GUIDE test strip Use to check BS qd dx: E11.65   Arginine 500 MG CAPS Take by mouth. Daily   aspirin 81 MG tablet Take 81 mg by mouth daily.   cholecalciferol (VITAMIN D) 1000 UNITS tablet Take 4,000 Units by mouth daily.   fluticasone (CUTIVATE) 0.05 % cream Apply topically 2 (two) times daily.   [name] XR 50-1000 MG TB24 Take 1 tablet by mouth 2 (two) times daily.   levocetirizine (XYZAL) 5 MG tablet Take 1 tablet (5 mg total) by mouth every evening.   Multiple Vitamin (MULTIVITAMIN) tablet Take 1 tablet by mouth daily.   olmesartan-hydrochlorothiazide (BENICAR HCT) 20-12.5 MG tablet Take 1 tablet by mouth daily.   rosuvastatin (CRESTOR) 10 MG tablet One tab po qd   tadalafil (CIALIS) 5 MG tablet TAKE 1 TABLET BY MOUTH ONCE A DAY.   No facility-administered encounter medications on file as of 08/09/2021.   [name], [place] Pharmacist Assistant [phone]

## 2021-08-10 DIAGNOSIS — E1122 Type 2 diabetes mellitus with diabetic chronic kidney disease: Secondary | ICD-10-CM | POA: Diagnosis not present

## 2021-08-10 DIAGNOSIS — N182 Chronic kidney disease, stage 2 (mild): Secondary | ICD-10-CM | POA: Diagnosis not present

## 2021-08-10 DIAGNOSIS — Z794 Long term (current) use of insulin: Secondary | ICD-10-CM | POA: Diagnosis not present

## 2021-08-10 DIAGNOSIS — E785 Hyperlipidemia, unspecified: Secondary | ICD-10-CM

## 2021-08-10 DIAGNOSIS — E7800 Pure hypercholesterolemia, unspecified: Secondary | ICD-10-CM | POA: Diagnosis not present

## 2021-08-10 NOTE — Telephone Encounter (Signed)
Please see note from office visit  

## 2021-08-23 ENCOUNTER — Telehealth: Payer: Medicare HMO

## 2021-08-23 ENCOUNTER — Ambulatory Visit (INDEPENDENT_AMBULATORY_CARE_PROVIDER_SITE_OTHER): Payer: Medicare HMO

## 2021-08-23 DIAGNOSIS — N182 Chronic kidney disease, stage 2 (mild): Secondary | ICD-10-CM

## 2021-08-23 DIAGNOSIS — E1122 Type 2 diabetes mellitus with diabetic chronic kidney disease: Secondary | ICD-10-CM

## 2021-08-23 DIAGNOSIS — E7800 Pure hypercholesterolemia, unspecified: Secondary | ICD-10-CM

## 2021-08-23 DIAGNOSIS — I129 Hypertensive chronic kidney disease with stage 1 through stage 4 chronic kidney disease, or unspecified chronic kidney disease: Secondary | ICD-10-CM

## 2021-08-23 NOTE — Chronic Care Management (AMB) (Signed)
Chronic Care Management   CCM RN Visit Note  08/23/2021 Name: Andres [name] MRN: [id number] DOB: 07-09-1951  Subjective: Andres [name] is a 71 y.o. year old male who is a primary care patient of Glendale Chard, MD. The care management team was consulted for assistance with disease management and care coordination needs.    Engaged with patient by telephone for follow up visit in response to provider referral for case management and/or care coordination services.   Consent to Services:  The patient was given information about Chronic Care Management services, agreed to services, and gave verbal consent prior to initiation of services.  Please see initial visit note for detailed documentation.   Patient agreed to services and verbal consent obtained.   Assessment: Review of patient past medical history, allergies, medications, health status, including review of consultants reports, laboratory and other test data, was performed as part of comprehensive evaluation and provision of chronic care management services.   SDOH (Social Determinants of Health) assessments and interventions performed:  Yes, no acute challenges   CCM Care Plan  No Known Allergies  Outpatient Encounter Medications as of 08/23/2021  Medication Sig   Accu-Chek FastClix Lancets MISC One tab po qd dx; e11.65   ACCU-CHEK GUIDE test strip Use to check BS qd dx: E11.65   Arginine 500 MG CAPS Take by mouth. Daily   aspirin 81 MG tablet Take 81 mg by mouth daily.   cholecalciferol (VITAMIN D) 1000 UNITS tablet Take 4,000 Units by mouth daily.   fluticasone (CUTIVATE) 0.05 % cream Apply topically 2 (two) times daily.   [name] XR 50-1000 MG TB24 Take 1 tablet by mouth 2 (two) times daily.   levocetirizine (XYZAL) 5 MG tablet Take 1 tablet (5 mg total) by mouth every evening.   Multiple Vitamin (MULTIVITAMIN) tablet Take 1 tablet by mouth daily.   olmesartan-hydrochlorothiazide (BENICAR HCT) 20-12.5 MG tablet Take 1  tablet by mouth daily.   rosuvastatin (CRESTOR) 10 MG tablet One tab po qd   tadalafil (CIALIS) 5 MG tablet TAKE 1 TABLET BY MOUTH ONCE A DAY.   No facility-administered encounter medications on file as of 08/23/2021.    Patient Active Problem List   Diagnosis Date Noted   Paresthesias 11/22/2020   Benign prostatic hyperplasia with urinary frequency 11/22/2020   Class 1 obesity due to excess calories with serious comorbidity and body mass index (BMI) of 33.0 to 33.9 in adult 11/22/2020   Type 2 diabetes mellitus with stage 2 chronic kidney disease, with long-term current use of insulin ([place]) 07/23/2018   Pure hypercholesterolemia 07/23/2018   HTN (hypertension) 07/23/2018   Hypertensive nephropathy 02/13/2018    Conditions to be addressed/monitored: DMII with stage II CKD, Pure Hypercholesterolemia, Hypertensive Nephropathy  Care Plan : RN Care Manager Plan of Care  Updates made by Lynne Logan, RN since 08/23/2021 12:00 AM     Problem: No plan of care established for management of chronic disease states (DMII with stage II CKD, Pure Hypercholesterolemia, Hypertensive Nephropathy)   Priority: High     Long-Range Goal: Establishment of plan of care for management of chronic disase states (DMII with stage II CKD, Pure Hypercholesterolemia, Hypertensive Nephropathy)   Start Date: 08/23/2021  Expected End Date: 08/23/2022  This Visit's Progress: On track  Priority: High  Note:   Current Barriers:  Knowledge Deficits related to plan of care for management of DMII with stage II CKD, Pure Hypercholesterolemia, Hypertensive Nephropathy  Chronic Disease Management support and education needs  related to DMII with stage II CKD, Pure Hypercholesterolemia, Hypertensive Nephropathy   RNCM Clinical Goal(s):  Patient will verbalize basic understanding of  DMII with stage II CKD, Pure Hypercholesterolemia, Hypertensive Nephropathy disease process and self health management plan as evidenced by  patient will report having no disease exacerbation related to her chronic disease states as listed above  take all medications exactly as prescribed and will call provider for medication related questions as evidenced by demonstrate improved understanding of prescribed medications and rationale for usage as evidenced by patient teach back demonstrate Ongoing health management independence as evidenced by patient will report 100% adherence to his prescribed treatment plan  continue to work with RN Care Manager to address care management and care coordination needs related to  DMII with stage II CKD, Pure Hypercholesterolemia, Hypertensive Nephropathy as evidenced by adherence to CM Team Scheduled appointments demonstrate ongoing self health care management ability   as evidenced by    through collaboration with RN Care manager, provider, and care team.   Interventions: 1:1 collaboration with primary care provider regarding development and update of comprehensive plan of care as evidenced by provider attestation and co-signature Inter-disciplinary care team collaboration (see longitudinal plan of care) Evaluation of current treatment plan related to  self management and patient's adherence to plan as established by provider  Hypertension Interventions:  (Status:  New goal.) Long Term Goal Last practice recorded BP readings:  BP Readings from Last 3 Encounters:  08/02/21 122/66  03/22/21 124/80  12/08/20 134/72  Most recent eGFR/CrCl:  Lab Results  Component Value Date   EGFR 66 08/02/2021    No components found for: CRCL Evaluation of current treatment plan related to hypertension self management and patient's adherence to plan as established by provider Advised patient, providing education and rationale, to monitor blood pressure daily and record, calling PCP for findings outside established parameters Provided education on prescribed diet low Sodium   Discussed plans with patient for ongoing  care management follow up and provided patient with direct contact information for care management team  Diabetes Interventions:  (Status:  Goal on track:  Yes.) Long Term Goal Assessed patient's understanding of A1c goal: <6.5% Provided education to patient about basic DM disease process Reviewed medications with patient and discussed importance of medication adherence Counseled on importance of regular laboratory monitoring as prescribed Advised patient, providing education and rationale, to check cbg daily before breakfast and at bedtime and record, calling PCP for findings outside established parameters Review of patient status, including review of consultants reports, relevant laboratory and other test results, and medications completed Educated patient on dietary and exercise recommendations; daily glycemic control FBS 80-130, <180 after meals;15'15' rule Mailed printed educational material related to The Dangers in Skipping Meals  Discussed plans with patient for ongoing care management follow up and provided patient with direct contact information for care management team Lab Results  Component Value Date   HGBA1C 6.5 (H) 08/02/2021   Abnormal HDL Interventions:  (Status:  Goal on track:  Yes.) Long Term Goal Evaluation of current treatment plan related to abnormal HDL self management and patient's adherence to plan as established by provider Medication review performed; medication list updated in electronic medical record Provider established cholesterol goals reviewed Provided HLD educational materials Reviewed exercise goals and target of 150 minutes per week Mailed printed educational materials related to How to Perform Chair Exercises; The Skinny on Fats  Discussed plans with patient for ongoing care management follow up and provided patient with  direct contact information for care management team   Patient Goals/Self-Care Activities: Take all medications as prescribed Attend  all scheduled provider appointments Call pharmacy for medication refills 3-7 days in advance of running out of medications Perform all self care activities independently  Perform IADL's (shopping, preparing meals, housekeeping, managing finances) independently Call provider office for new concerns or questions  drink 6 to 8 glasses of water each day manage portion size  Follow Up Plan:  Telephone follow up appointment with care management team member scheduled for:  11/17/21       Plan:Telephone follow up appointment with care management team member scheduled for:  11/17/21  Barb Merino, RN, BSN, CCM Care Management Coordinator [place] [facility]  Direct Phone: [phone]

## 2021-08-23 NOTE — Patient Instructions (Signed)
Visit Information  Thank you for taking time to visit with me today. Please don't hesitate to contact me if I can be of assistance to you before our next scheduled telephone appointment.  Following are the goals we discussed today:  (Copy and paste patient goals from clinical care plan here)  Our next appointment is by telephone on 11/17/21 at 11:30 AM   Please call the care guide team at [phone] if you need to cancel or reschedule your appointment.   If you are experiencing a Mental Health or [place] or need someone to talk to, please call [phone]-TALK (toll free, 24 hour hotline)   Patient verbalizes understanding of instructions and care plan provided today and agrees to view in [place]. Active MyChart status confirmed with patient.    Barb Merino, RN, BSN, CCM Care Management Coordinator [place] [facility]  Direct Phone: [phone]

## 2021-08-27 ENCOUNTER — Telehealth: Payer: Self-pay

## 2021-08-27 NOTE — Chronic Care Management (AMB) (Signed)
° ° °  Chronic Care Management Pharmacy Assistant   Name: Andres [name]  MRN: [id number] DOB: May 08, 1951  Reason for Encounter: Disease State/ Diabetes  Recent office visits:  08-02-2021 Glendale Chard, MD. Pneumococcal injection given. STOP Vitamin B12. Glucose= 169. A1C= 6.5. Magnesium= 1.5.  Recent consult visits:  None  Hospital visits:  None in previous 6 months  Medications: Outpatient Encounter Medications as of 08/27/2021  Medication Sig   Accu-Chek FastClix Lancets MISC One tab po qd dx; e11.65   ACCU-CHEK GUIDE test strip Use to check BS qd dx: E11.65   Arginine 500 MG CAPS Take by mouth. Daily   aspirin 81 MG tablet Take 81 mg by mouth daily.   cholecalciferol (VITAMIN D) 1000 UNITS tablet Take 4,000 Units by mouth daily.   fluticasone (CUTIVATE) 0.05 % cream Apply topically 2 (two) times daily.   [name] XR 50-1000 MG TB24 Take 1 tablet by mouth 2 (two) times daily.   levocetirizine (XYZAL) 5 MG tablet Take 1 tablet (5 mg total) by mouth every evening.   Multiple Vitamin (MULTIVITAMIN) tablet Take 1 tablet by mouth daily.   olmesartan-hydrochlorothiazide (BENICAR HCT) 20-12.5 MG tablet Take 1 tablet by mouth daily.   rosuvastatin (CRESTOR) 10 MG tablet One tab po qd   tadalafil (CIALIS) 5 MG tablet TAKE 1 TABLET BY MOUTH ONCE A DAY.   No facility-administered encounter medications on file as of 08/27/2021.   Recent Relevant Labs: Lab Results  Component Value Date/Time   HGBA1C 6.5 (H) 08/02/2021 11:18 AM   HGBA1C 6.7 (H) 03/22/2021 10:15 AM   HGBA1C 7.7 02/13/2018 12:00 AM   MICROALBUR 30 11/07/2019 11:30 AM   MICROALBUR 10 10/30/2018 10:29 AM    Kidney Function Lab Results  Component Value Date/Time   CREATININE 1.20 08/02/2021 11:18 AM   CREATININE 1.17 03/22/2021 10:15 AM   GFRNONAA 62 06/29/2020 03:13 PM   GFRAA 71 06/29/2020 03:13 PM    08-27-2021: 1st attempt left VM 08-31-2021: 2nd attempt left VM 09-01-2021: 3rd attempt left VM  Care  Gaps: Shingrix overdue AWV 11-24-2021  Star Rating Drugs: [name] XR 50-1000 mg- Patient assistance Rosuvastatin 10 mg- Last filled 06-16-2021 90 DS [place] [facility] [facility] Benicar/HCTZ 20-12.5 mg- Last filled 07-28-2021 90 DS [place] Clinical Pharmacist Assistant [phone]

## 2021-09-07 ENCOUNTER — Ambulatory Visit (INDEPENDENT_AMBULATORY_CARE_PROVIDER_SITE_OTHER): Payer: Medicare HMO

## 2021-09-07 ENCOUNTER — Other Ambulatory Visit: Payer: Self-pay

## 2021-09-07 DIAGNOSIS — Z23 Encounter for immunization: Secondary | ICD-10-CM | POA: Diagnosis not present

## 2021-09-07 DIAGNOSIS — Z794 Long term (current) use of insulin: Secondary | ICD-10-CM

## 2021-09-07 DIAGNOSIS — E1122 Type 2 diabetes mellitus with diabetic chronic kidney disease: Secondary | ICD-10-CM | POA: Diagnosis not present

## 2021-09-07 DIAGNOSIS — N182 Chronic kidney disease, stage 2 (mild): Secondary | ICD-10-CM | POA: Diagnosis not present

## 2021-09-07 DIAGNOSIS — I129 Hypertensive chronic kidney disease with stage 1 through stage 4 chronic kidney disease, or unspecified chronic kidney disease: Secondary | ICD-10-CM

## 2021-09-07 DIAGNOSIS — E7800 Pure hypercholesterolemia, unspecified: Secondary | ICD-10-CM | POA: Diagnosis not present

## 2021-09-07 NOTE — Progress Notes (Signed)
° °  Covid-19 Vaccination Clinic  Name:  Andres [name]    MRN: [id number] DOB: August 13, 1950  09/07/2021  Andres [name] was observed post Covid-19 immunization for 15 minutes without incident. He was provided with Vaccine Information Sheet and instruction to access the V-Safe system.   Andres [name] was instructed to call 911 with any severe reactions post vaccine: Difficulty breathing  Swelling of face and throat  A fast heartbeat  A bad rash all over body  Dizziness and weakness   Immunizations Administered     Name Date Dose VIS Date Route   Moderna Covid-19 vaccine Bivalent Booster 09/07/2021  2:14 PM 0.5 mL 02/20/2021 Intramuscular   Manufacturer: Moderna   Lot: [id number]   [place]: [id number]

## 2021-10-21 ENCOUNTER — Telehealth: Payer: Self-pay

## 2021-10-21 ENCOUNTER — Other Ambulatory Visit: Payer: Self-pay

## 2021-10-21 MED ORDER — FLUTICASONE PROPIONATE 0.05 % EX CREA: TOPICAL_CREAM | Freq: Two times a day (BID) | CUTANEOUS | 2 refills | Status: DC

## 2021-10-21 NOTE — Chronic Care Management (AMB) (Signed)
? ? ?  Chronic Care Management ?Pharmacy Assistant  ? ?Name: Andres [name]  MRN: [id number] DOB: 25-Jun-1951 ? ? ?Reason for Encounter: Disease State/ Diabetes ? ?Recent office visits:  ?09-07-2021 Covid booster given ? ?08-23-2021 Little, Claudette Stapler, RN (CCM) ? ?08-02-2021 Glendale Chard, MD. Glucose= 169. A1C= 6.5. Magnesium= 1.5. STOP B12. ? ?Recent consult visits:  ?None ? ?Hospital visits:  ?None in previous 6 months ? ?Medications: ?Outpatient Encounter Medications as of 10/21/2021  ?Medication Sig  ? Accu-Chek FastClix Lancets MISC One tab po qd dx; e11.65  ? ACCU-CHEK GUIDE test strip Use to check BS qd dx: E11.65  ? Arginine 500 MG CAPS Take by mouth. Daily  ? aspirin 81 MG tablet Take 81 mg by mouth daily.  ? cholecalciferol (VITAMIN D) 1000 UNITS tablet Take 4,000 Units by mouth daily.  ? fluticasone (CUTIVATE) 0.05 % cream Apply topically 2 (two) times daily.  ? [name] XR 50-1000 MG TB24 Take 1 tablet by mouth 2 (two) times daily.  ? levocetirizine (XYZAL) 5 MG tablet Take 1 tablet (5 mg total) by mouth every evening.  ? Multiple Vitamin (MULTIVITAMIN) tablet Take 1 tablet by mouth daily.  ? olmesartan-hydrochlorothiazide (BENICAR HCT) 20-12.5 MG tablet Take 1 tablet by mouth daily.  ? rosuvastatin (CRESTOR) 10 MG tablet One tab po qd  ? tadalafil (CIALIS) 5 MG tablet TAKE 1 TABLET BY MOUTH ONCE A DAY.  ? ?No facility-administered encounter medications on file as of 10/21/2021.  ? ?Recent Relevant Labs: ?Lab Results  ?Component Value Date/Time  ? HGBA1C 6.5 (H) 08/02/2021 11:18 AM  ? HGBA1C 6.7 (H) 03/22/2021 10:15 AM  ? HGBA1C 7.7 02/13/2018 12:00 AM  ? MICROALBUR 30 11/07/2019 11:30 AM  ? MICROALBUR 10 10/30/2018 10:29 AM  ?  ?Kidney Function ?Lab Results  ?Component Value Date/Time  ? CREATININE 1.20 08/02/2021 11:18 AM  ? CREATININE 1.17 03/22/2021 10:15 AM  ? GFRNONAA 62 06/29/2020 03:13 PM  ? GFRAA 71 06/29/2020 03:13 PM  ? ? ?Current antihyperglycemic regimen:  ?[name] XR 50-1000 mg twice daily ? ?What  recent interventions/DTPs have been made to improve glycemic control:  ?Educated on A1c and blood sugar goals; ?Benefits of routine self-monitoring of blood sugar; ?Continuous glucose monitoring; ?-Congratulated patient on meeting his A1c goals  ? ?Have there been any recent hospitalizations or ED visits since last visit with CPP? No ? ?Patient denies hypoglycemic symptoms ? ?Patient denies hyperglycemic symptoms ? ?How often are you checking your blood sugar? twice daily ? ?What are your blood sugars ranging?  ?Fasting: None ?Before meals: None ?After meals: 117 ?Bedtime: 114 ? ?During the week, how often does your blood glucose drop below 70? Never ? ?Are you checking your feet daily/regularly? Daily  ? ?Adherence Review: ?Is the patient currently on a STATIN medication? Yes ?Is the patient currently on ACE/ARB medication? Yes ?Does the patient have >5 day gap between last estimated fill dates? No ? ?NOTES: ?Patient stated he is in need of refills on cutivate cream sent to [facility]. Sent message to [facility] CMA. ? ?Care Gaps: ?Shingrix overdue ?AWV 11-24-2021 ? ?Star Rating Drugs: ?[name] XR 50-1000 mg- Patient assistance ?Rosuvastatin 10 mg- Last filled 08-02-2021 90 DS [facility] ?Olmesartan/HCTZ 20-12.5 mg- Last filled 07-28-2021 90 DS [facility] ? ?Jeannette How CMA ?Clinical Pharmacist Assistant ?[phone] ? ?

## 2021-10-25 ENCOUNTER — Other Ambulatory Visit: Payer: Self-pay | Admitting: Internal Medicine

## 2021-11-11 ENCOUNTER — Telehealth: Payer: Self-pay

## 2021-11-11 NOTE — Progress Notes (Signed)
    Chronic Care Management Pharmacy Assistant   Name: Andres [name]  MRN: [id number] DOB: 07-18-1950  Reason for Encounter: Medication adherence   Medications: Outpatient Encounter Medications as of 11/11/2021  Medication Sig   Accu-Chek FastClix Lancets MISC One tab po qd dx; e11.65   ACCU-CHEK GUIDE test strip Use to check BS qd dx: E11.65   Arginine 500 MG CAPS Take by mouth. Daily   aspirin 81 MG tablet Take 81 mg by mouth daily.   cholecalciferol (VITAMIN D) 1000 UNITS tablet Take 4,000 Units by mouth daily.   fluticasone (CUTIVATE) 0.05 % cream Apply topically 2 (two) times daily.   [name] XR 50-1000 MG TB24 Take 1 tablet by mouth 2 (two) times daily.   levocetirizine (XYZAL) 5 MG tablet Take 1 tablet (5 mg total) by mouth every evening.   Multiple Vitamin (MULTIVITAMIN) tablet Take 1 tablet by mouth daily.   olmesartan-hydrochlorothiazide (BENICAR HCT) 20-12.5 MG tablet TAKE 1 TABLET BY MOUTH ONCE DAILY.   rosuvastatin (CRESTOR) 10 MG tablet One tab po qd   tadalafil (CIALIS) 5 MG tablet TAKE 1 TABLET BY MOUTH ONCE A DAY.   No facility-administered encounter medications on file as of 11/11/2021.   11-11-2021: Patient is on medication adherence list for non-compliance for rosuvastatin. Patient's medication was filled on 08-02-2021 for 90 DS. Contacted [facility] and was told medication was never picked up on 08-02-2021.Updated medication adherence form.  [place] Pharmacist Assistant [phone]

## 2021-11-15 ENCOUNTER — Ambulatory Visit (INDEPENDENT_AMBULATORY_CARE_PROVIDER_SITE_OTHER): Payer: Medicare HMO | Admitting: Internal Medicine

## 2021-11-15 ENCOUNTER — Encounter: Payer: Self-pay | Admitting: Internal Medicine

## 2021-11-15 VITALS — BP 118/70 | HR 82 | Temp 97.9°F | Ht 71.2 in | Wt 232.0 lb

## 2021-11-15 DIAGNOSIS — Z6832 Body mass index (BMI) 32.0-32.9, adult: Secondary | ICD-10-CM

## 2021-11-15 DIAGNOSIS — R351 Nocturia: Secondary | ICD-10-CM | POA: Diagnosis not present

## 2021-11-15 DIAGNOSIS — Z1211 Encounter for screening for malignant neoplasm of colon: Secondary | ICD-10-CM

## 2021-11-15 DIAGNOSIS — I129 Hypertensive chronic kidney disease with stage 1 through stage 4 chronic kidney disease, or unspecified chronic kidney disease: Secondary | ICD-10-CM | POA: Diagnosis not present

## 2021-11-15 DIAGNOSIS — E6609 Other obesity due to excess calories: Secondary | ICD-10-CM | POA: Diagnosis not present

## 2021-11-15 DIAGNOSIS — N182 Chronic kidney disease, stage 2 (mild): Secondary | ICD-10-CM

## 2021-11-15 DIAGNOSIS — H6122 Impacted cerumen, left ear: Secondary | ICD-10-CM

## 2021-11-15 DIAGNOSIS — Z0000 Encounter for general adult medical examination without abnormal findings: Secondary | ICD-10-CM | POA: Diagnosis not present

## 2021-11-15 DIAGNOSIS — E1122 Type 2 diabetes mellitus with diabetic chronic kidney disease: Secondary | ICD-10-CM

## 2021-11-15 DIAGNOSIS — Z794 Long term (current) use of insulin: Secondary | ICD-10-CM

## 2021-11-15 DIAGNOSIS — R0982 Postnasal drip: Secondary | ICD-10-CM | POA: Diagnosis not present

## 2021-11-15 LAB — POCT URINALYSIS DIPSTICK
Bilirubin, UA: NEGATIVE
Blood, UA: NEGATIVE
Glucose, UA: NEGATIVE
Ketones, UA: NEGATIVE
Leukocytes, UA: NEGATIVE
Nitrite, UA: NEGATIVE
Protein, UA: NEGATIVE
Spec Grav, UA: 1.025 (ref 1.010–1.025)
Urobilinogen, UA: 0.2 E.U./dL
pH, UA: 5.5 (ref 5.0–8.0)

## 2021-11-15 MED ORDER — LEVOCETIRIZINE DIHYDROCHLORIDE 5 MG PO TABS: 5.0000 mg | ORAL_TABLET | Freq: Every evening | ORAL | 2 refills | Status: DC

## 2021-11-15 MED ORDER — OLMESARTAN MEDOXOMIL-HCTZ 20-12.5 MG PO TABS: 1.0000 | ORAL_TABLET | Freq: Every day | ORAL | 2 refills | Status: DC

## 2021-11-15 NOTE — Progress Notes (Signed)
?Rich Brave Llittleton,acting as a Education administrator for [name], MD.,have documented all relevant documentation on the behalf of [name], MD,as directed by  [name], MD while in the presence of [name], MD.  ?This visit occurred during the SARS-CoV-2 public health emergency.  Safety protocols were in place, including screening questions prior to the visit, additional usage of staff PPE, and extensive cleaning of exam room while observing appropriate contact time as indicated for disinfecting solutions. ? ?Subjective:  ?  ? Patient ID: Andres [name] , male    DOB: [birth year] , 71 y.o.   MRN: [id number] ? ? ?Chief Complaint  ?Patient presents with  ? Annual Exam  ? Diabetes  ? Hypertension  ? ? ?HPI ? ?Patient here for a physical. He reports compliance with meds. He denies having any headaches, chest pain and shortness of breath. He is followed by Urology for his prostate exams.  ? ?Diabetes ?He presents for his follow-up diabetic visit. He has type 2 diabetes mellitus. His disease course has been stable. There are no hypoglycemic associated symptoms. Pertinent negatives for diabetes include no blurred vision. There are no hypoglycemic complications. Diabetic complications include nephropathy. Risk factors for coronary artery disease include diabetes mellitus, dyslipidemia, hypertension and male sex. He is following a diabetic diet. His breakfast blood glucose is taken between 8-9 am. His breakfast blood glucose range is generally 90-110 mg/dl. An ACE inhibitor/angiotensin II receptor blocker is being taken. Eye exam is not current.  ?Hypertension ?This is a chronic problem. The current episode started more than 1 year ago. The problem has been gradually improving since onset. The problem is controlled. Pertinent negatives include no blurred vision. Past treatments include angiotensin blockers and diuretics. The current treatment provides moderate improvement.   ? ?Past Medical History:   ?Diagnosis Date  ? Diabetes ([place])   ? High cholesterol   ? Hypertension   ? states under control with med., has been on med. x 5 yr.  ? Seasonal allergies   ? Umbilical hernia 11/2839  ?  ? ?Family History  ?Problem Relation Age of Onset  ? Healthy Mother   ? Early death Father   ? ? ? ?Current Outpatient Medications:  ?  Accu-Chek FastClix Lancets MISC, One tab po qd dx; e11.65, Disp: 100 each, Rfl: 11 ?  ACCU-CHEK GUIDE test strip, Use to check BS qd dx: E11.65, Disp: 100 each, Rfl: 11 ?  Arginine 500 MG CAPS, Take by mouth. Daily, Disp: , Rfl:  ?  aspirin 81 MG tablet, Take 81 mg by mouth daily., Disp: , Rfl:  ?  cholecalciferol (VITAMIN D) 1000 UNITS tablet, Take 4,000 Units by mouth daily., Disp: , Rfl:  ?  Cyanocobalamin (VITAMIN B 12) 250 MCG LOZG, Take 1 tablet by mouth every other day., Disp: , Rfl:  ?  fluticasone (CUTIVATE) 0.05 % cream, Apply topically 2 (two) times daily., Disp: 60 g, Rfl: 2 ?  [name] XR 50-1000 MG TB24, Take 1 tablet by mouth 2 (two) times daily., Disp: 180 tablet, Rfl: 1 ?  Multiple Vitamin (MULTIVITAMIN) tablet, Take 1 tablet by mouth daily., Disp: , Rfl:  ?  rosuvastatin (CRESTOR) 10 MG tablet, One tab po qd, Disp: 90 tablet, Rfl: 2 ?  tadalafil (CIALIS) 5 MG tablet, TAKE 1 TABLET BY MOUTH ONCE A DAY., Disp: 30 tablet, Rfl: 2 ?  levocetirizine (XYZAL) 5 MG tablet, Take 1 tablet (5 mg total) by mouth every evening., Disp: 90 tablet, Rfl:  2 ?  olmesartan-hydrochlorothiazide (BENICAR HCT) 20-12.5 MG tablet, Take 1 tablet by mouth daily., Disp: 90 tablet, Rfl: 2  ? ?No Known Allergies  ? ?Men's preventive visit. Patient Health Questionnaire (PHQ-2) is  ?[place] Office Visit from 11/15/2021 in [facility]  ?PHQ-2 Total Score 0  ? ?  ?. Patient is on a healthy diet. Marital status: Legally Separated. Relevant history for alcohol use is:  ?Social History  ? ?Substance and Sexual Activity  ?Alcohol Use No  ?. Relevant history for tobacco use is:  ?Social History   ? ?Tobacco Use  ?Smoking Status Former  ? Packs/day: 0.50  ? Years: 22.00  ? Pack years: 11.00  ? Types: Cigarettes  ? Quit date: 07/11/2011  ? Years since quitting: 10.3  ?Smokeless Tobacco Never  ?Tobacco Comments  ? he has quit  ?.  ? ?Review of Systems  ?Constitutional: Negative.   ?HENT: Negative.    ?Eyes: Negative.  Negative for blurred vision.  ?Respiratory: Negative.    ?Cardiovascular: Negative.   ?Gastrointestinal: Negative.   ?Endocrine: Negative.   ?Genitourinary: Negative.   ?Musculoskeletal: Negative.   ?Skin: Negative.   ?Neurological: Negative.   ?Hematological: Negative.   ?Psychiatric/Behavioral: Negative.     ? ?Today's Vitals  ? 11/15/21 0950  ?BP: 118/70  ?Pulse: 82  ?Temp: 97.9 ?F (36.6 ?C)  ?Weight: 232 lb (105.2 kg)  ?Height: 5' 11.2" (1.808 m)  ?PainSc: 0-No pain  ? ?Body mass index is 32.18 kg/m?.  ?Wt Readings from Last 3 Encounters:  ?11/15/21 232 lb (105.2 kg)  ?08/02/21 228 lb 10.1 oz (103.7 kg)  ?03/22/21 232 lb 3.2 oz (105.3 kg)  ?  ? ?Objective:  ?Physical Exam ?Vitals and nursing note reviewed.  ?Constitutional:   ?   Appearance: Normal appearance.  ?HENT:  ?   Head: Normocephalic and atraumatic.  ?   Right Ear: Tympanic membrane, ear canal and external ear normal.  ?   Left Ear: Ear canal and external ear normal. There is impacted cerumen.  ?   Nose: Nose normal.  ?   Mouth/Throat:  ?   Mouth: Mucous membranes are moist.  ?   Pharynx: Oropharynx is clear.  ?Eyes:  ?   Extraocular Movements: Extraocular movements intact.  ?   Conjunctiva/sclera: Conjunctivae normal.  ?   Pupils: Pupils are equal, round, and reactive to light.  ?Cardiovascular:  ?   Rate and Rhythm: Normal rate and regular rhythm.  ?   Pulses: Normal pulses.     ?     Dorsalis pedis pulses are 2+ on the right side and 2+ on the left side.  ?   Heart sounds: Normal heart sounds.  ?Pulmonary:  ?   Effort: Pulmonary effort is normal.  ?   Breath sounds: Normal breath sounds.  ?Chest:  ?Breasts: ?   Right: Normal. No  swelling, bleeding, inverted nipple, mass or nipple discharge.  ?   Left: Normal. No swelling, bleeding, inverted nipple, mass or nipple discharge.  ?Abdominal:  ?   General: Abdomen is flat. Bowel sounds are normal.  ?   Palpations: Abdomen is soft.  ?Genitourinary: ?   Comments: Deferred  ?Musculoskeletal:     ?   General: Normal range of motion.  ?   Cervical back: Normal range of motion and neck supple.  ?Feet:  ?   Right foot:  ?   Protective Sensation: 5 sites tested.  5 sites sensed.  ?   Skin integrity:  Dry skin present.  ?   Toenail Condition: Right toenails are normal.  ?   Left foot:  ?   Protective Sensation: 5 sites tested.  5 sites sensed.  ?   Skin integrity: Dry skin present.  ?   Toenail Condition: Left toenails are normal.  ?Skin: ?   General: Skin is warm.  ?Neurological:  ?   General: No focal deficit present.  ?   Mental Status: He is alert.  ?Psychiatric:     ?   Mood and Affect: Mood normal.     ?   Behavior: Behavior normal.  ?   ?Assessment And Plan:  ?  ?1. Encounter for general adult medical examination w/o abnormal findings ?Comments: A full exam was performed. DRE deferred as per patient. He is also followed by Urology. PATIENT IS ADVISED TO GET 30-45 MINUTES REGULAR EXERCISE NO LESS THAN FOUR TO FIVE DAYS PER WEEK - BOTH WEIGHTBEARING EXERCISES AND AEROBIC ARE RECOMMENDED.  PATIENT IS ADVISED TO FOLLOW A HEALTHY DIET WITH AT LEAST SIX FRUITS/VEGGIES PER DAY, DECREASE INTAKE OF RED MEAT, AND TO INCREASE FISH INTAKE TO TWO DAYS PER WEEK.  MEATS/FISH SHOULD NOT BE FRIED, BAKED OR BROILED IS PREFERABLE.  IT IS ALSO IMPORTANT TO CUT BACK ON YOUR SUGAR INTAKE. PLEASE AVOID ANYTHING WITH ADDED SUGAR, CORN SYRUP OR OTHER SWEETENERS. IF YOU MUST USE A SWEETENER, YOU CAN TRY STEVIA. IT IS ALSO IMPORTANT TO AVOID ARTIFICIALLY SWEETENERS AND DIET BEVERAGES. LASTLY, I SUGGEST WEARING SPF 50 SUNSCREEN ON EXPOSED PARTS AND ESPECIALLY WHEN IN THE DIRECT SUNLIGHT FOR AN EXTENDED PERIOD OF TIME.  PLEASE  AVOID FAST FOOD RESTAURANTS AND INCREASE YOUR WATER INTAKE. ? ?2. Type 2 diabetes mellitus with stage 2 chronic kidney disease, with long-term current use of insulin ([place]) ?Comments: Chronic, diabetic foot exa

## 2021-11-15 NOTE — Patient Instructions (Signed)
Health Maintenance, Male Adopting a healthy lifestyle and getting preventive care are important in promoting health and wellness. Ask your health care provider about: The right schedule for you to have regular tests and exams. Things you can do on your own to prevent diseases and keep yourself healthy. What should I know about diet, weight, and exercise? Eat a healthy diet  Eat a diet that includes plenty of vegetables, fruits, low-fat dairy products, and lean protein. Do not eat a lot of foods that are high in solid fats, added sugars, or sodium. Maintain a healthy weight Body mass index (BMI) is a measurement that can be used to identify possible weight problems. It estimates body fat based on height and weight. Your health care provider can help determine your BMI and help you achieve or maintain a healthy weight. Get regular exercise Get regular exercise. This is one of the most important things you can do for your health. Most adults should: Exercise for at least 150 minutes each week. The exercise should increase your heart rate and make you sweat (moderate-intensity exercise). Do strengthening exercises at least twice a week. This is in addition to the moderate-intensity exercise. Spend less time sitting. Even light physical activity can be beneficial. Watch cholesterol and blood lipids Have your blood tested for lipids and cholesterol at 69 years of age, then have this test every 5 years. You may need to have your cholesterol levels checked more often if: Your lipid or cholesterol levels are high. You are older than 69 years of age. You are at high risk for heart disease. What should I know about cancer screening? Many types of cancers can be detected early and may often be prevented. Depending on your health history and family history, you may need to have cancer screening at various ages. This may include screening for: Colorectal cancer. Prostate cancer. Skin cancer. Lung  cancer. What should I know about heart disease, diabetes, and high blood pressure? Blood pressure and heart disease High blood pressure causes heart disease and increases the risk of stroke. This is more likely to develop in people who have high blood pressure readings or are overweight. Talk with your health care provider about your target blood pressure readings. Have your blood pressure checked: Every 3-5 years if you are 18-39 years of age. Every year if you are 40 years old or older. If you are between the ages of 65 and 75 and are a current or former smoker, ask your health care provider if you should have a one-time screening for abdominal aortic aneurysm (AAA). Diabetes Have regular diabetes screenings. This checks your fasting blood sugar level. Have the screening done: Once every three years after age 45 if you are at a normal weight and have a low risk for diabetes. More often and at a younger age if you are overweight or have a high risk for diabetes. What should I know about preventing infection? Hepatitis B If you have a higher risk for hepatitis B, you should be screened for this virus. Talk with your health care provider to find out if you are at risk for hepatitis B infection. Hepatitis C Blood testing is recommended for: Everyone born from 1945 through 1965. Anyone with known risk factors for hepatitis C. Sexually transmitted infections (STIs) You should be screened each year for STIs, including gonorrhea and chlamydia, if: You are sexually active and are younger than 69 years of age. You are older than 69 years of age and your   health care provider tells you that you are at risk for this type of infection. Your sexual activity has changed since you were last screened, and you are at increased risk for chlamydia or gonorrhea. Ask your health care provider if you are at risk. Ask your health care provider about whether you are at high risk for HIV. Your health care provider  may recommend a prescription medicine to help prevent HIV infection. If you choose to take medicine to prevent HIV, you should first get tested for HIV. You should then be tested every 3 months for as long as you are taking the medicine. Follow these instructions at home: Alcohol use Do not drink alcohol if your health care provider tells you not to drink. If you drink alcohol: Limit how much you have to 0-2 drinks a day. Know how much alcohol is in your drink. In the [place] one drink equals one 12 oz bottle of beer (355 mL), one 5 oz glass of wine (148 mL), or one 1 oz glass of hard liquor (44 mL). Lifestyle Do not use any products that contain nicotine or tobacco. These products include cigarettes, chewing tobacco, and vaping devices, such as e-cigarettes. If you need help quitting, ask your health care provider. Do not use street drugs. Do not share needles. Ask your health care provider for help if you need support or information about quitting drugs. General instructions Schedule regular health, dental, and eye exams. Stay current with your vaccines. Tell your health care provider if: You often feel depressed. You have ever been abused or do not feel safe at home. Summary Adopting a healthy lifestyle and getting preventive care are important in promoting health and wellness. Follow your health care provider's instructions about healthy diet, exercising, and getting tested or screened for diseases. Follow your health care provider's instructions on monitoring your cholesterol and blood pressure. This information is not intended to replace advice given to you by your health care provider. Make sure you discuss any questions you have with your health care provider. Document Revised: 11/16/2020 Document Reviewed: 11/16/2020 Elsevier Patient Education  2023 [employer]  

## 2021-11-16 LAB — CMP14+EGFR
ALT: 28 IU/L (ref 0–44)
AST: 25 IU/L (ref 0–40)
Albumin/Globulin Ratio: 1.5 (ref 1.2–2.2)
Albumin: 4.4 g/dL (ref 3.8–4.8)
Alkaline Phosphatase: 58 IU/L (ref 44–121)
BUN/Creatinine Ratio: 11 (ref 10–24)
BUN: 13 mg/dL (ref 8–27)
Bilirubin Total: 0.5 mg/dL (ref 0.0–1.2)
CO2: 25 mmol/L (ref 20–29)
Calcium: 9.5 mg/dL (ref 8.6–10.2)
Chloride: 100 mmol/L (ref 96–106)
Creatinine, Ser: 1.19 mg/dL (ref 0.76–1.27)
Globulin, Total: 2.9 g/dL (ref 1.5–4.5)
Glucose: 109 mg/dL — ABNORMAL HIGH (ref 70–99)
Potassium: 4.4 mmol/L (ref 3.5–5.2)
Sodium: 139 mmol/L (ref 134–144)
Total Protein: 7.3 g/dL (ref 6.0–8.5)
eGFR: 67 mL/min/{1.73_m2} (ref >59)

## 2021-11-16 LAB — LIPID PANEL
Chol/HDL Ratio: 4.2 ratio (ref 0.0–5.0)
Cholesterol, Total: 133 mg/dL (ref 100–199)
HDL: 32 mg/dL — ABNORMAL LOW (ref >39)
LDL Chol Calc (NIH): 78 mg/dL (ref 0–99)
Triglycerides: 130 mg/dL (ref 0–149)
VLDL Cholesterol Cal: 23 mg/dL (ref 5–40)

## 2021-11-16 LAB — CBC
Hematocrit: 36.8 % — ABNORMAL LOW (ref 37.5–51.0)
Hemoglobin: 12.5 g/dL — ABNORMAL LOW (ref 13.0–17.7)
MCH: 30.3 pg (ref 26.6–33.0)
MCHC: 34 g/dL (ref 31.5–35.7)
MCV: 89 fL (ref 79–97)
Platelets: 227 10*3/uL (ref 150–450)
RBC: 4.13 x10E6/uL — ABNORMAL LOW (ref 4.14–5.80)
RDW: 12.6 % (ref 11.6–15.4)
WBC: 6.4 10*3/uL (ref 3.4–10.8)

## 2021-11-16 LAB — PSA: Prostate Specific Ag, Serum: 1 ng/mL (ref 0.0–4.0)

## 2021-11-16 LAB — HEMOGLOBIN A1C
Est. average glucose Bld gHb Est-mCnc: 146 mg/dL
Hgb A1c MFr Bld: 6.7 % — ABNORMAL HIGH (ref 4.8–5.6)

## 2021-11-16 LAB — MICROALBUMIN / CREATININE URINE RATIO
Creatinine, Urine: 153 mg/dL
Microalb/Creat Ratio: 8 mg/g creat (ref 0–29)
Microalbumin, Urine: 12.8 ug/mL

## 2021-11-17 ENCOUNTER — Telehealth: Payer: Medicare HMO

## 2021-11-17 ENCOUNTER — Ambulatory Visit (INDEPENDENT_AMBULATORY_CARE_PROVIDER_SITE_OTHER): Payer: Medicare HMO

## 2021-11-17 DIAGNOSIS — E1122 Type 2 diabetes mellitus with diabetic chronic kidney disease: Secondary | ICD-10-CM

## 2021-11-17 DIAGNOSIS — I129 Hypertensive chronic kidney disease with stage 1 through stage 4 chronic kidney disease, or unspecified chronic kidney disease: Secondary | ICD-10-CM

## 2021-11-17 DIAGNOSIS — E7800 Pure hypercholesterolemia, unspecified: Secondary | ICD-10-CM

## 2021-11-17 NOTE — Patient Instructions (Signed)
Visit Information ? ?Thank you for taking time to visit with me today. Please don't hesitate to contact me if I can be of assistance to you before our next scheduled telephone appointment. ? ?Following are the goals we discussed today:  ?(Copy and paste patient goals from clinical care plan here) ? ?Our next appointment is by telephone on 03/25/22 at 11:15 AM ? ?Please call the care guide team at [phone] if you need to cancel or reschedule your appointment.  ? ?If you are experiencing a Mental Health or [place] or need someone to talk to, please call 1-800-273-TALK (toll free, 24 hour hotline)  ? ?Patient verbalizes understanding of instructions and care plan provided today and agrees to view in [place]. Active MyChart status confirmed with patient.   ? ?Barb Merino, RN, BSN, CCM ?Care Management Coordinator ?[facility]  ?Direct Phone: [phone] ? ? ?

## 2021-11-17 NOTE — Chronic Care Management (AMB) (Signed)
?Chronic Care Management  ? ?CCM RN Visit Note ? ?11/17/2021 ?Name: Andres [name] MRN: [id number] DOB: Nov 23, 1950 ? ?Subjective: ?Andres [name] is a 71 y.o. year old male who is a primary care patient of Glendale Chard, MD. The care management team was consulted for assistance with disease management and care coordination needs.   ? ?Engaged with patient by telephone for follow up visit in response to provider referral for case management and/or care coordination services.  ? ?Consent to Services:  ?The patient was given information about Chronic Care Management services, agreed to services, and gave verbal consent prior to initiation of services.  Please see initial visit note for detailed documentation.  ? ?Patient agreed to services and verbal consent obtained.  ? ?Assessment: Review of patient past medical history, allergies, medications, health status, including review of consultants reports, laboratory and other test data, was performed as part of comprehensive evaluation and provision of chronic care management services.  ? ?SDOH (Social Determinants of Health) assessments and interventions performed:  Yes, no acute needs  ? ?CCM Care Plan ? ?No Known Allergies ? ?Outpatient Encounter Medications as of 11/17/2021  ?Medication Sig  ? Accu-Chek FastClix Lancets MISC One tab po qd dx; e11.65  ? ACCU-CHEK GUIDE test strip Use to check BS qd dx: E11.65  ? Arginine 500 MG CAPS Take by mouth. Daily  ? aspirin 81 MG tablet Take 81 mg by mouth daily.  ? cholecalciferol (VITAMIN D) 1000 UNITS tablet Take 4,000 Units by mouth daily.  ? Cyanocobalamin (VITAMIN B 12) 250 MCG LOZG Take 1 tablet by mouth every other day.  ? fluticasone (CUTIVATE) 0.05 % cream Apply topically 2 (two) times daily.  ? [name] XR 50-1000 MG TB24 Take 1 tablet by mouth 2 (two) times daily.  ? levocetirizine (XYZAL) 5 MG tablet Take 1 tablet (5 mg total) by mouth every evening.  ? Multiple Vitamin (MULTIVITAMIN) tablet Take 1 tablet by mouth  daily.  ? olmesartan-hydrochlorothiazide (BENICAR HCT) 20-12.5 MG tablet Take 1 tablet by mouth daily.  ? rosuvastatin (CRESTOR) 10 MG tablet One tab po qd  ? tadalafil (CIALIS) 5 MG tablet TAKE 1 TABLET BY MOUTH ONCE A DAY.  ? ?No facility-administered encounter medications on file as of 11/17/2021.  ? ? ?Patient Active Problem List  ? Diagnosis Date Noted  ? Paresthesias 11/22/2020  ? Benign prostatic hyperplasia with urinary frequency 11/22/2020  ? Class 1 obesity due to excess calories with serious comorbidity and body mass index (BMI) of 33.0 to 33.9 in adult 11/22/2020  ? Type 2 diabetes mellitus with stage 2 chronic kidney disease, with long-term current use of insulin ([place]) 07/23/2018  ? Pure hypercholesterolemia 07/23/2018  ? HTN (hypertension) 07/23/2018  ? Hypertensive nephropathy 02/13/2018  ? ? ?Conditions to be addressed/monitored: DMII with stage II CKD, Pure Hypercholesterolemia, Hypertensive Nephropathy ? ?Care Plan : RN Care Manager Plan of Care  ?Updates made by Lynne Logan, RN since 11/17/2021 12:00 AM  ?  ? ?Problem: No plan of care established for management of chronic disease states (DMII with stage II CKD, Pure Hypercholesterolemia, Hypertensive Nephropathy)   ?Priority: High  ?  ? ?Long-Range Goal: Establishment of plan of care for management of chronic disase states (DMII with stage II CKD, Pure Hypercholesterolemia, Hypertensive Nephropathy)   ?Start Date: 08/23/2021  ?Expected End Date: 08/23/2022  ?Recent Progress: On track  ?Priority: High  ?Note:   ?Current Barriers:  ?Knowledge Deficits related to plan of care for management of DMII  with stage II CKD, Pure Hypercholesterolemia, Hypertensive Nephropathy  ?Chronic Disease Management support and education needs related to DMII with stage II CKD, Pure Hypercholesterolemia, Hypertensive Nephropathy  ? ?RNCM Clinical Goal(s):  ?Patient will verbalize basic understanding of  DMII with stage II CKD, Pure Hypercholesterolemia, Hypertensive  Nephropathy disease process and self health management plan as evidenced by patient will report having no disease exacerbation related to her chronic disease states as listed above  ?take all medications exactly as prescribed and will call provider for medication related questions as evidenced by demonstrate improved understanding of prescribed medications and rationale for usage as evidenced by patient teach back ?demonstrate Ongoing health management independence as evidenced by patient will report 100% adherence to his prescribed treatment plan  ?continue to work with RN Care Manager to address care management and care coordination needs related to  DMII with stage II CKD, Pure Hypercholesterolemia, Hypertensive Nephropathy as evidenced by adherence to CM Team Scheduled appointments ?demonstrate ongoing self health care management ability   as evidenced by    through collaboration with RN Care manager, provider, and care team.  ? ?Interventions: ?1:1 collaboration with primary care provider regarding development and update of comprehensive plan of care as evidenced by provider attestation and co-signature ?Inter-disciplinary care team collaboration (see longitudinal plan of care) ?Evaluation of current treatment plan related to  self management and patient's adherence to plan as established by provider ? ?Hypertension Interventions:  (Status:  Condition stable.  Not addressed this visit.) Long Term Goal ?Last practice recorded BP readings:  ?BP Readings from Last 3 Encounters:  ?08/02/21 122/66  ?03/22/21 124/80  ?12/08/20 134/72  ?Most recent eGFR/CrCl:  ?Lab Results  ?Component Value Date  ? EGFR 66 08/02/2021  ?  No components found for: CRCL ?Evaluation of current treatment plan related to hypertension self management and patient's adherence to plan as established by provider ?Advised patient, providing education and rationale, to monitor blood pressure daily and record, calling PCP for findings outside  established parameters ?Provided education on prescribed diet low Sodium   ?Discussed plans with patient for ongoing care management follow up and provided patient with direct contact information for care management team ? ?Diabetes Interventions:  (Status:  Goal on track:  Yes.) Long Term Goal ?Review of patient status, including review of consultants reports, relevant laboratory and other test results, and medications completed ?Assessed patient's understanding of A1c goal: <6.4% ?Provided education to patient about basic DM disease process, complications and self-health management  ?Educated patient on dietary and exercise recommendations ?Educated patient, providing rationale on the importance to perform daily foot inspections, reporting abnormal findings to PCP promptly, educated on referral process for podiatry, educated patient on importance of yearly eye exams, patient reports being up to date on his eye exam ?Mailed printed educational materials related to Carb Choice list ?Discussed plans with patient for ongoing care management follow up and provided patient with direct contact information for care management team ?Lab Results  ?Component Value Date  ? HGBA1C 6.5 (H) 08/02/2021  ? ?Lab Results  ?Component Value Date  ? HGBA1C 6.7 (H) 11/15/2021  ? ?Abnormal HDL Interventions:  (Status:  Goal on track:  Yes.) Long Term Goal ?Evaluation of current treatment plan related to abnormal HDL self management and patient's adherence to plan as established by provider ?Review of patient status, including review of consultant's reports, relevant laboratory and other test results ?Positive reinforcement provided to patient for making efforts to increase his HDL ?Provided education to patient regarding  HDL and the importance of increasing to optimal level to help keep Cholesterol and LDL at optimal range, decreasing risk for MI/Stroke  ?Provider established cholesterol goals reviewed ?Mailed printed educational  material related to HDL management  ?Discussed plans with patient for ongoing care management follow up and provided patient with direct contact information for care management team ?Lipid Panel  ?   ?Component Value D

## 2021-11-22 ENCOUNTER — Other Ambulatory Visit (INDEPENDENT_AMBULATORY_CARE_PROVIDER_SITE_OTHER): Payer: Medicare HMO

## 2021-11-22 DIAGNOSIS — Z1211 Encounter for screening for malignant neoplasm of colon: Secondary | ICD-10-CM

## 2021-11-22 LAB — HEMOCCULT GUIAC POC 1CARD (OFFICE)
Card #1 Date: 5112023
Card #2 Date: 5122023
Card #2 Fecal Occult Blod, POC: POSITIVE
Card #3 Date: 5132023
Card #3 Fecal Occult Blood, POC: POSITIVE
Fecal Occult Blood, POC: POSITIVE — AB

## 2021-11-24 ENCOUNTER — Ambulatory Visit: Payer: Medicare HMO | Admitting: Internal Medicine

## 2021-11-24 ENCOUNTER — Ambulatory Visit: Payer: Medicare HMO

## 2021-11-25 ENCOUNTER — Ambulatory Visit (INDEPENDENT_AMBULATORY_CARE_PROVIDER_SITE_OTHER): Payer: Medicare HMO

## 2021-11-25 VITALS — Ht 71.0 in | Wt 230.0 lb

## 2021-11-25 DIAGNOSIS — Z0000 Encounter for general adult medical examination without abnormal findings: Secondary | ICD-10-CM

## 2021-11-25 NOTE — Progress Notes (Signed)
I connected with Andres [name] today by telephone and verified that I am speaking with the correct person using two identifiers. Location patient: home Location provider: work Persons participating in the virtual visit: Mohmed Harrington, Jobe LPN.   I discussed the limitations, risks, security and privacy concerns of performing an evaluation and management service by telephone and the availability of in person appointments. I also discussed with the patient that there may be a patient responsible charge related to this service. The patient expressed understanding and verbally consented to this telephonic visit.    Interactive audio and video telecommunications were attempted between this provider and patient, however failed, due to patient having technical difficulties OR patient did not have access to video capability.  We continued and completed visit with audio only.     Vital signs may be patient reported or missing.  Subjective:   Andres [name] is a 71 y.o. male who presents for Medicare Annual/Subsequent preventive examination.  Review of Systems     Cardiac Risk Factors include: advanced age (>83mn, >>64women);diabetes mellitus;hypertension;male gender;obesity (BMI >30kg/m2)     Objective:    Today's Vitals   11/25/21 1214  Weight: 230 lb (104.3 kg)  Height: '5\' 11"'$  (1.803 m)   Body mass index is 32.08 kg/m.     11/25/2021   12:20 PM 11/12/2020   10:30 AM 11/07/2019   10:36 AM 10/30/2018   10:10 AM 09/25/2014    6:30 AM 09/19/2014   10:51 AM  Advanced Directives  Does Patient Have a Medical Advance Directive? No Yes Yes No Yes Yes  Type of Advance Directive  Living will Living [place]  [place];Healthcare Power of Attorney  Does patient want to make changes to medical advance directive?     No - Patient declined No - Patient declined  [place] of [place]?     No - copy requested   Would patient  like information on creating a medical advance directive?    Yes (MAU/Ambulatory/Procedural Areas - Information given)      Current Medications (verified) Outpatient Encounter Medications as of 11/25/2021  Medication Sig   Accu-Chek FastClix Lancets MISC One tab po qd dx; e11.65   ACCU-CHEK GUIDE test strip Use to check BS qd dx: E11.65   Arginine 500 MG CAPS Take by mouth. Daily   aspirin 81 MG tablet Take 81 mg by mouth daily.   cholecalciferol (VITAMIN D) 1000 UNITS tablet Take 4,000 Units by mouth daily.   Cyanocobalamin (VITAMIN B 12) 250 MCG LOZG Take 1 tablet by mouth every other day.   fluticasone (CUTIVATE) 0.05 % cream Apply topically 2 (two) times daily.   levocetirizine (XYZAL) 5 MG tablet Take 1 tablet (5 mg total) by mouth every evening.   Magnesium Oxide 420 MG TABS Take 1 tablet by mouth daily.   Multiple Vitamin (MULTIVITAMIN) tablet Take 1 tablet by mouth daily.   olmesartan-hydrochlorothiazide (BENICAR HCT) 20-12.5 MG tablet Take 1 tablet by mouth daily.   rosuvastatin (CRESTOR) 10 MG tablet One tab po qd   tadalafil (CIALIS) 5 MG tablet TAKE 1 TABLET BY MOUTH ONCE A DAY.   [name] XR 50-1000 MG TB24 Take 1 tablet by mouth 2 (two) times daily.   No facility-administered encounter medications on file as of 11/25/2021.    Allergies (verified) Patient has no known allergies.   History: Past Medical History:  Diagnosis Date   Diabetes ([place]    High  cholesterol    Hypertension    states under control with med., has been on med. x 5 yr.   Seasonal allergies    Umbilical hernia 02/3381   Past Surgical History:  Procedure Laterality Date   INSERTION OF MESH N/A 09/25/2014   Procedure: INSERTION OF MESH;  Surgeon: Donnie Mesa, MD;  Location: [facility];  Service: General;  Laterality: N/A;   ORIF PATELLA FRACTURE Right 06/16/2008   TOTAL HIP ARTHROPLASTY Left 5/0/5397   UMBILICAL HERNIA REPAIR N/A 09/25/2014   Procedure: UMBILICAL HERNIA REPAIR WITH  MESH;  Surgeon: Donnie Mesa, MD;  Location: [facility];  Service: General;  Laterality: N/A;   Family History  Problem Relation Age of Onset   Healthy Mother    Early death Father    Social History   Socioeconomic History   Marital status: Legally Separated    Spouse name: Not on file   Number of children: Not on file   Years of education: Not on file   Highest education level: Not on file  Occupational History   Occupation: retired  Tobacco Use   Smoking status: Former    Packs/day: 0.50    Years: 22.00    Pack years: 11.00    Types: Cigarettes    Quit date: 07/11/2011    Years since quitting: 10.3   Smokeless tobacco: Never   Tobacco comments:    he has quit  Vaping Use   Vaping Use: Never used  Substance and Sexual Activity   Alcohol use: No   Drug use: No   Sexual activity: Yes  Other Topics Concern   Not on file  Social History Narrative   Not on file   Social Determinants of Health   Financial Resource Strain: Low Risk    Difficulty of Paying Living Expenses: Not hard at all  Food Insecurity: No Food Insecurity   Worried About Charity fundraiser in the Last Year: Never true   [place] in the Last Year: Never true  Transportation Needs: No Transportation Needs   Lack of Transportation (Medical): No   Lack of Transportation (Non-Medical): No  Physical Activity: Sufficiently Active   Days of Exercise per Week: 3 days   Minutes of Exercise per Session: 50 min  Stress: No Stress Concern Present   Feeling of Stress : Not at all  Social Connections: Not on file    Tobacco Counseling Counseling given: Not Answered Tobacco comments: he has quit   Clinical Intake:  Pre-visit preparation completed: Yes  Pain : No/denies pain     Nutritional Status: BMI > 30  Obese Nutritional Risks: None Diabetes: Yes  How often do you need to have someone help you when you read instructions, pamphlets, or other written materials from your  doctor or pharmacy?: 1 - Never What is the last grade level you completed in school?: 12th grade  Diabetic? Yes Nutrition Risk Assessment:  Has the patient had any N/V/D within the last 2 months?  No  Does the patient have any non-healing wounds?  No  Has the patient had any unintentional weight loss or weight gain?  No   Diabetes:  Is the patient diabetic?  Yes  If diabetic, was a CBG obtained today?  No  Did the patient bring in their glucometer from home?  No  How often do you monitor your CBG's? Twice daily.   Financial Strains and Diabetes Management:  Are you having any financial strains  with the device, your supplies or your medication? No .  Does the patient want to be seen by Chronic Care Management for management of their diabetes?  No  Would the patient like to be referred to a Nutritionist or for Diabetic Management?  No   Diabetic Exams:  Diabetic Eye Exam: Completed 06/28/2021 Diabetic Foot Exam: Completed 11/15/2021   Interpreter Needed?: No  Information entered by :: NAllen LPN   Activities of Daily Living    11/25/2021   12:21 PM  In your present state of health, do you have any difficulty performing the following activities:  Hearing? 0  Vision? 1  Comment little blurry at times  Difficulty concentrating or making decisions? 0  Walking or climbing stairs? 0  Dressing or bathing? 0  Doing errands, shopping? 0  Preparing Food and eating ? N  Using the Toilet? N  In the past six months, have you accidently leaked urine? N  Do you have problems with loss of bowel control? N  Managing your Medications? N  Managing your Finances? N  Housekeeping or managing your Housekeeping? N    Patient Care Team: Glendale Chard, MD as PCP - General (Internal Medicine) Harl Bowie Alphonse Guild, MD as PCP - Cardiology (Cardiology) Rex Kras Claudette Stapler, RN as Case Manager Mayford Knife, [facility] (Pharmacist)  Indicate any recent Medical Services you may have received from  other than [facility] providers in the past year (date may be approximate).     Assessment:   This is a routine wellness examination for Andres [name].  Hearing/Vision screen Vision Screening - Comments:: Regular eye exams, [facility]  Dietary issues and exercise activities discussed: Current Exercise Habits: Home exercise routine, Type of exercise: walking, Time (Minutes): 50, Frequency (Times/Week): 3, Weekly Exercise (Minutes/Week): 150   Goals Addressed             This Visit's Progress    Patient Stated       11/25/2021, no goals       Depression Screen    11/25/2021   12:21 PM 11/15/2021    9:53 AM 11/12/2020   10:31 AM 11/09/2020   11:13 AM 11/07/2019   10:37 AM 10/30/2018    9:32 AM 07/23/2018   10:09 AM  PHQ 2/9 Scores  PHQ - 2 Score 0 0 0 0 0 0 0  PHQ- 9 Score     0      Fall Risk    11/25/2021   12:21 PM 11/15/2021    9:53 AM 11/12/2020   10:31 AM 11/09/2020   11:13 AM 11/07/2019   10:36 AM  [place] in the past year? 0 0 0 0 0  Number falls in past yr: 0 0     Injury with Fall? 0 0     Risk for fall due to : Medication side effect No Fall Risks Medication side effect  Medication side effect  Follow up Falls evaluation completed;Education provided;Falls prevention discussed Falls evaluation completed Falls evaluation completed;Education provided;Falls prevention discussed  Falls evaluation completed;Education provided;Falls prevention discussed    FALL RISK PREVENTION PERTAINING TO THE HOME:  Any stairs in or around the home? Yes  If so, are there any without handrails? No  Home free of loose throw rugs in walkways, pet beds, electrical cords, etc? Yes  Adequate lighting in your home to reduce risk of falls? Yes   ASSISTIVE DEVICES UTILIZED TO PREVENT FALLS:  Life alert? No  Use of a cane, walker  or w/c? No  Grab bars in the bathroom? No  Shower chair or bench in shower? No  Elevated toilet seat or a handicapped toilet? Yes   TIMED UP AND GO:  Was the  test performed? No .      Cognitive Function:        11/25/2021   12:22 PM 11/12/2020   10:32 AM 11/07/2019   10:38 AM 10/30/2018    9:35 AM  6CIT Screen  What Year? 0 points 0 points 0 points 0 points  What month? 0 points 0 points 0 points 0 points  What time? 0 points 0 points 0 points 0 points  Count back from 20 0 points 0 points 0 points 0 points  Months in reverse 0 points 0 points 0 points 0 points  Repeat phrase 2 points 0 points 2 points 2 points  Total Score 2 points 0 points 2 points 2 points    Immunizations Immunization History  Administered Date(s) Administered   Fluad Quad(high Dose 65+) 05/08/2020, 03/22/2021   Influenza, High Dose Seasonal PF 04/11/2018, 04/01/2019   Moderna Covid-19 Vaccine Bivalent Booster 18yr & up 09/07/2021   Moderna SARS-COV2 Booster Vaccination 05/22/2020   Moderna Sars-Covid-2 Vaccination 08/17/2019, 09/17/2019   PNEUMOCOCCAL CONJUGATE-20 08/02/2021   Pneumococcal Polysaccharide-23 07/23/2018   Tdap 04/17/2019   Zoster Recombinat (Shingrix) 04/19/2021, 08/30/2021    TDAP status: Up to date  Flu Vaccine status: Up to date  Pneumococcal vaccine status: Up to date  Covid-19 vaccine status: Completed vaccines  Qualifies for Shingles Vaccine? Yes   Zostavax completed Yes   Shingrix Completed?: Yes  Screening Tests Health Maintenance  Topic Date Due   COVID-19 Vaccine (3 - Moderna risk series) 09/07/2021   INFLUENZA VACCINE  02/08/2022   HEMOGLOBIN A1C  05/18/2022   OPHTHALMOLOGY EXAM  06/28/2022   FOOT EXAM  11/16/2022   COLONOSCOPY (Pts 45-425yrInsurance coverage will need to be confirmed)  04/06/2025   TETANUS/TDAP  04/16/2029   Pneumonia Vaccine 6523Years old  Completed   Hepatitis C Screening  Completed   Zoster Vaccines- Shingrix  Completed   HPV VACCINES  Aged Out    Health Maintenance  Health Maintenance Due  Topic Date Due   COVID-19 Vaccine (3 - Moderna risk series) 09/07/2021    Colorectal cancer  screening: Type of screening: Colonoscopy. Completed 04/07/2015. Repeat every 10 years  Lung Cancer Screening: (Low Dose CT Chest recommended if Age 69-80ears, 30 pack-year currently smoking OR have quit w/in 15years.) does not qualify.   Lung Cancer Screening Referral: no  Additional Screening:  Hepatitis C Screening: does qualify; Completed 11/24/2012  Vision Screening: Recommended annual ophthalmology exams for early detection of glaucoma and other disorders of the eye. Is the patient up to date with their annual eye exam?  No  Who is the provider or what is the name of the office in which the patient attends annual eye exams? [facility] If pt is not established with a provider, would they like to be referred to a provider to establish care? No .   Dental Screening: Recommended annual dental exams for proper oral hygiene  Community Resource Referral / Chronic Care Management: CRR required this visit?  No   CCM required this visit?  No      Plan:     I have personally reviewed and noted the following in the patient's chart:   Medical and social history Use of alcohol, tobacco or illicit drugs  Current medications  and supplements including opioid prescriptions. Patient is not currently taking opioid prescriptions. Functional ability and status Nutritional status Physical activity Advanced directives List of other physicians Hospitalizations, surgeries, and ER visits in previous 12 months Vitals Screenings to include cognitive, depression, and falls Referrals and appointments  In addition, I have reviewed and discussed with patient certain preventive protocols, quality metrics, and best practice recommendations. A written personalized care plan for preventive services as well as general preventive health recommendations were provided to patient.     Kellie Simmering, LPN   3/40/3524   Nurse Notes: none  Due to this being a virtual visit, the after visit summary  with patients personalized plan was offered to patient via mail or my-chart. per request, patient was mailed a copy of AVS.

## 2021-11-25 NOTE — Patient Instructions (Signed)
Andres [name] , Thank you for taking time to come for your Medicare Wellness Visit. I appreciate your ongoing commitment to your health goals. Please review the following plan we discussed and let me know if I can assist you in the future.   Screening recommendations/referrals: Colonoscopy: completed 04/07/2015 Recommended yearly ophthalmology/optometry visit for glaucoma screening and checkup Recommended yearly dental visit for hygiene and checkup  Vaccinations: Influenza vaccine: due 02/08/2022 Pneumococcal vaccine: completed 08/02/2021 Tdap vaccine: completed 04/17/2019, due 04/16/2029 Shingles vaccine: completed   Covid-19:  09/07/2021, 05/22/2020, 09/17/2019, 08/17/2019  Advanced directives: Advance directive discussed with you today.   Conditions/risks identified: none  Next appointment: Follow up in one year for your annual wellness visit.   Preventive Care 69 Years and Older, Male Preventive care refers to lifestyle choices and visits with your health care provider that can promote health and wellness. What does preventive care include? A yearly physical exam. This is also called an annual well check. Dental exams once or twice a year. Routine eye exams. Ask your health care provider how often you should have your eyes checked. Personal lifestyle choices, including: Daily care of your teeth and gums. Regular physical activity. Eating a healthy diet. Avoiding tobacco and drug use. Limiting alcohol use. Practicing safe sex. Taking low doses of aspirin every day. Taking vitamin and mineral supplements as recommended by your health care provider. What happens during an annual well check? The services and screenings done by your health care provider during your annual well check will depend on your age, overall health, lifestyle risk factors, and family history of disease. Counseling  Your health care provider may ask you questions about your: Alcohol use. Tobacco use. Drug  use. Emotional well-being. Home and relationship well-being. Sexual activity. Eating habits. History of falls. Memory and ability to understand (cognition). Work and work Statistician. Screening  You may have the following tests or measurements: Height, weight, and BMI. Blood pressure. Lipid and cholesterol levels. These may be checked every 5 years, or more frequently if you are over 55 years old. Skin check. Lung cancer screening. You may have this screening every year starting at age 69 if you have a 30-pack-year history of smoking and currently smoke or have quit within the past 15 years. Fecal occult blood test (FOBT) of the stool. You may have this test every year starting at age 30. Flexible sigmoidoscopy or colonoscopy. You may have a sigmoidoscopy every 5 years or a colonoscopy every 10 years starting at age 69. Prostate cancer screening. Recommendations will vary depending on your family history and other risks. Hepatitis C blood test. Hepatitis B blood test. Sexually transmitted disease (STD) testing. Diabetes screening. This is done by checking your blood sugar (glucose) after you have not eaten for a while (fasting). You may have this done every 1-3 years. Abdominal aortic aneurysm (AAA) screening. You may need this if you are a current or former smoker. Osteoporosis. You may be screened starting at age 69 if you are at high risk. Talk with your health care provider about your test results, treatment options, and if necessary, the need for more tests. Vaccines  Your health care provider may recommend certain vaccines, such as: Influenza vaccine. This is recommended every year. Tetanus, diphtheria, and acellular pertussis (Tdap, Td) vaccine. You may need a Td booster every 10 years. Zoster vaccine. You may need this after age 69. Pneumococcal 13-valent conjugate (PCV13) vaccine. One dose is recommended after age 69. Pneumococcal polysaccharide (PPSV23) vaccine. One dose is  recommended after age 69. Talk to your health care provider about which screenings and vaccines you need and how often you need them. This information is not intended to replace advice given to you by your health care provider. Make sure you discuss any questions you have with your health care provider. Document Released: 07/24/2015 Document Revised: 03/16/2016 Document Reviewed: 04/28/2015 Elsevier Interactive Patient Education  2017 [place] Prevention in the Home Falls can cause injuries. They can happen to people of all ages. There are many things you can do to make your home safe and to help prevent falls. What can I do on the outside of my home? Regularly fix the edges of walkways and driveways and fix any cracks. Remove anything that might make you trip as you walk through a door, such as a raised step or threshold. Trim any bushes or trees on the path to your home. Use bright outdoor lighting. Clear any walking paths of anything that might make someone trip, such as rocks or tools. Regularly check to see if handrails are loose or broken. Make sure that both sides of any steps have handrails. Any raised decks and porches should have guardrails on the edges. Have any leaves, snow, or ice cleared regularly. Use sand or salt on walking paths during winter. Clean up any spills in your garage right away. This includes oil or grease spills. What can I do in the bathroom? Use night lights. Install grab bars by the toilet and in the tub and shower. Do not use towel bars as grab bars. Use non-skid mats or decals in the tub or shower. If you need to sit down in the shower, use a plastic, non-slip stool. Keep the floor dry. Clean up any water that spills on the floor as soon as it happens. Remove soap buildup in the tub or shower regularly. Attach bath mats securely with double-sided non-slip rug tape. Do not have throw rugs and other things on the floor that can make you  trip. What can I do in the bedroom? Use night lights. Make sure that you have a light by your bed that is easy to reach. Do not use any sheets or blankets that are too big for your bed. They should not hang down onto the floor. Have a firm chair that has side arms. You can use this for support while you get dressed. Do not have throw rugs and other things on the floor that can make you trip. What can I do in the kitchen? Clean up any spills right away. Avoid walking on wet floors. Keep items that you use a lot in easy-to-reach places. If you need to reach something above you, use a strong step stool that has a grab bar. Keep electrical cords out of the way. Do not use floor polish or wax that makes floors slippery. If you must use wax, use non-skid floor wax. Do not have throw rugs and other things on the floor that can make you trip. What can I do with my stairs? Do not leave any items on the stairs. Make sure that there are handrails on both sides of the stairs and use them. Fix handrails that are broken or loose. Make sure that handrails are as long as the stairways. Check any carpeting to make sure that it is firmly attached to the stairs. Fix any carpet that is loose or worn. Avoid having throw rugs at the top or bottom of the stairs. If you  do have throw rugs, attach them to the floor with carpet tape. Make sure that you have a light switch at the top of the stairs and the bottom of the stairs. If you do not have them, ask someone to add them for you. What else can I do to help prevent falls? Wear shoes that: Do not have high heels. Have rubber bottoms. Are comfortable and fit you well. Are closed at the toe. Do not wear sandals. If you use a stepladder: Make sure that it is fully opened. Do not climb a closed stepladder. Make sure that both sides of the stepladder are locked into place. Ask someone to hold it for you, if possible. Clearly mark and make sure that you can  see: Any grab bars or handrails. First and last steps. Where the edge of each step is. Use tools that help you move around (mobility aids) if they are needed. These include: Canes. Walkers. Scooters. Crutches. Turn on the lights when you go into a dark area. Replace any light bulbs as soon as they burn out. Set up your furniture so you have a clear path. Avoid moving your furniture around. If any of your floors are uneven, fix them. If there are any pets around you, be aware of where they are. Review your medicines with your doctor. Some medicines can make you feel dizzy. This can increase your chance of falling. Ask your doctor what other things that you can do to help prevent falls. This information is not intended to replace advice given to you by your health care provider. Make sure you discuss any questions you have with your health care provider. Document Released: 04/23/2009 Document Revised: 12/03/2015 Document Reviewed: 08/01/2014 Elsevier Interactive Patient Education  2017 [employer].

## 2021-11-29 ENCOUNTER — Telehealth: Payer: Self-pay

## 2021-11-29 NOTE — Chronic Care Management (AMB) (Signed)
Chronic Care Management Pharmacy Assistant   Name: Andres [name]  MRN: [id number] DOB: 1950-09-23  Reason for Encounter: Disease State/ Cholesterol  Recent office visits:  11-25-2021 Andres Simmering, LPN. Medicare wellness visit.  11-17-2021 Little, Andres Stapler, RN (CCM).  11-15-2021 Andres [name]. RBC= 4.13, Hemo= 4.13, Hema= 36.8. Glucose= 109. A1C= 6.7. HDL= 72. EKG completed.  Recent consult visits:  None  Hospital visits:  None in previous 6 months  Medications: Outpatient Encounter Medications as of 11/29/2021  Medication Sig   Accu-Chek FastClix Lancets MISC One tab po qd dx; e11.65   ACCU-CHEK GUIDE test strip Use to check BS qd dx: E11.65   Arginine 500 MG CAPS Take by mouth. Daily   aspirin 81 MG tablet Take 81 mg by mouth daily.   cholecalciferol (VITAMIN D) 1000 UNITS tablet Take 4,000 Units by mouth daily.   Cyanocobalamin (VITAMIN B 12) 250 MCG LOZG Take 1 tablet by mouth every other day.   fluticasone (CUTIVATE) 0.05 % cream Apply topically 2 (two) times daily.   [name] XR 50-1000 MG TB24 Take 1 tablet by mouth 2 (two) times daily.   levocetirizine (XYZAL) 5 MG tablet Take 1 tablet (5 mg total) by mouth every evening.   Magnesium Oxide 420 MG TABS Take 1 tablet by mouth daily.   Multiple Vitamin (MULTIVITAMIN) tablet Take 1 tablet by mouth daily.   olmesartan-hydrochlorothiazide (BENICAR HCT) 20-12.5 MG tablet Take 1 tablet by mouth daily.   rosuvastatin (CRESTOR) 10 MG tablet One tab po qd   tadalafil (CIALIS) 5 MG tablet TAKE 1 TABLET BY MOUTH ONCE A DAY.   No facility-administered encounter medications on file as of 11/29/2021.   11/29/2021 Name: Andres [name] MRN: [id number] DOB: November 02, 1950 Andres [name] is a 71 y.o. year old male who is a primary care patient of Andres [name].  Comprehensive medication review performed; Spoke to patient regarding cholesterol  Lipid Panel    Component Value Date/Time   CHOL 133 11/15/2021 1119   TRIG  130 11/15/2021 1119   HDL 32 (L) 11/15/2021 1119   [place] 78 11/15/2021 1119    10-year ASCVD risk score: The ASCVD Risk score (Arnett DK, et al., 2019) failed to calculate for the following reasons:   The systolic blood pressure is missing  Current antihyperlipidemic regimen:  Rosuvastatin 10 mg daily  Previous antihyperlipidemic medications tried: None  ASCVD risk enhancing conditions: age >28, DM, and HTN  What recent interventions/DTPs have been made by any provider to improve Cholesterol control since last CPP Visit:  Educated on Benefits of statin for ASCVD risk reduction; Importance of limiting foods high in cholesterol; -Recommended to continue current medication  Any recent hospitalizations or ED visits since last visit with CPP? No  What diet changes have been made to improve Cholesterol?  Patient stated he has limited his fried/greasy food intake a drinks plenty of water.  What exercise is being done to improve Cholesterol?  Patient states he walks daily and is very active.  Adherence Review: Does the patient have >5 day gap between last estimated fill dates? Yes  NOTES: Patient stated he was taking Crestor twice daily in the past and Andres [name] instructed him to cut back to once daily. Asked patient when the last time statin was filled and he stated his pill bottle didn't have a date on it but showed his next fill date is 11-09-2021 with 2 refills remaining from [facility]. Patient states he has about  20 pills left. Patient reports feeling good and is compliant with taking medications as directed.  Care Gaps: Last AWV 11-25-2021  Star Rating Drugs: [name] XR 50-1000 mg- Patient assistance Rosuvastatin 10 mg- Last filled 08-02-2021 90 DS [facility] (Medication never picked up from [place]. Patient stated he gets med from [facility]) Olmesartan/HCTZ 20-12.5 mg- Last filled 11-15-2021 90 DS [place] Clinical Pharmacist  Assistant [phone]

## 2021-12-01 ENCOUNTER — Telehealth: Payer: Self-pay

## 2021-12-01 MED ORDER — ROSUVASTATIN CALCIUM 10 MG PO TABS: 10.0000 mg | ORAL_TABLET | ORAL | 2 refills | Status: DC

## 2021-12-01 NOTE — Telephone Encounter (Signed)
  Chronic Care Management - STATIN USE - Rosuvastatin 10 mg tablet   Follow Up Note   12/01/2021 Name: Andres [name] MRN: [id number] DOB: [birth year]  Referred by: Glendale Chard, MD Reason for referral : Chronic Care Management (Pharmacist Statin follow up)   Andres [name] is a 71 y.o. year old male who is a primary care patient of Glendale Chard, MD. The CCM team was consulted for assistance with chronic disease management and care coordination needs.    Review of patient status, including review of consultants reports, relevant laboratory and other test results, and collaboration with appropriate care team members and the patient's provider was performed as part of comprehensive patient evaluation and provision of chronic care management services.    SDOH (Social Determinants of Health) assessments performed: No See Care Plan activities for detailed interventions related to [facility])  '@SDOHINTERVENTIONS'$ @   Outpatient Encounter Medications as of 12/01/2021  Medication Sig   Accu-Chek FastClix Lancets MISC One tab po qd dx; e11.65   ACCU-CHEK GUIDE test strip Use to check BS qd dx: E11.65   Arginine 500 MG CAPS Take by mouth. Daily   aspirin 81 MG tablet Take 81 mg by mouth daily.   cholecalciferol (VITAMIN D) 1000 UNITS tablet Take 4,000 Units by mouth daily.   Cyanocobalamin (VITAMIN B 12) 250 MCG LOZG Take 1 tablet by mouth every other day.   fluticasone (CUTIVATE) 0.05 % cream Apply topically 2 (two) times daily.   [name] XR 50-1000 MG TB24 Take 1 tablet by mouth 2 (two) times daily.   levocetirizine (XYZAL) 5 MG tablet Take 1 tablet (5 mg total) by mouth every evening.   Magnesium Oxide 420 MG TABS Take 1 tablet by mouth daily.   Multiple Vitamin (MULTIVITAMIN) tablet Take 1 tablet by mouth daily.   olmesartan-hydrochlorothiazide (BENICAR HCT) 20-12.5 MG tablet Take 1 tablet by mouth daily.   rosuvastatin (CRESTOR) 10 MG tablet One tab po qd   tadalafil (CIALIS) 5 MG tablet TAKE  1 TABLET BY MOUTH ONCE A DAY.   No facility-administered encounter medications on file as of 12/01/2021.     Objective:  -Confirm patients current medication regimen for rosuvastatin 10 mg tablet, since medication has not been filled per note written by [facility] in May 22,2023   Adherence Review: Does the patient have >5 day gap between last estimated fill dates? Yes, Rosuvastatin 10 mg tablet - to be filled on 11/09/2021   Plan:   Called patient to review patients current use of statin medication since he has not been picking it up from the pharmacy. He reports that he has plenty of medication because he has been taking the rosuvastatin three days per week. Collaborated with PCP team, and per [name] patient should be taking Rosuvastatin five days per week.Called patient and explained that he should be taking Rosuvastatin 10 mg tablet Monday through Friday. Will update his current directions of his medication and send it to the pharmacy. Patient voiced understanding of these changes. Medication sent to patient preferred pharmacy [place], CPP, PharmD Clinical Pharmacist Practitioner [facility] [phone]

## 2021-12-08 DIAGNOSIS — Z87891 Personal history of nicotine dependence: Secondary | ICD-10-CM

## 2021-12-08 DIAGNOSIS — I10 Essential (primary) hypertension: Secondary | ICD-10-CM

## 2021-12-08 DIAGNOSIS — Z794 Long term (current) use of insulin: Secondary | ICD-10-CM | POA: Diagnosis not present

## 2021-12-08 DIAGNOSIS — E1159 Type 2 diabetes mellitus with other circulatory complications: Secondary | ICD-10-CM

## 2021-12-10 ENCOUNTER — Other Ambulatory Visit: Payer: Self-pay

## 2021-12-10 DIAGNOSIS — R195 Other fecal abnormalities: Secondary | ICD-10-CM

## 2021-12-10 DIAGNOSIS — D649 Anemia, unspecified: Secondary | ICD-10-CM

## 2021-12-29 DIAGNOSIS — K5730 Diverticulosis of large intestine without perforation or abscess without bleeding: Secondary | ICD-10-CM | POA: Diagnosis not present

## 2021-12-29 DIAGNOSIS — R195 Other fecal abnormalities: Secondary | ICD-10-CM | POA: Diagnosis not present

## 2021-12-29 DIAGNOSIS — D509 Iron deficiency anemia, unspecified: Secondary | ICD-10-CM | POA: Diagnosis not present

## 2021-12-30 ENCOUNTER — Telehealth: Payer: Self-pay

## 2021-12-30 NOTE — Chronic Care Management (AMB) (Signed)
Chronic Care Management Pharmacy Assistant   Name: Andres [name]  MRN: [id number] DOB: 1951-02-23  Reason for Encounter: Disease State/ General  Recent office visits:  None  Recent consult visits:  None  Hospital visits:  None in previous 6 months  Medications: Outpatient Encounter Medications as of 12/30/2021  Medication Sig   Accu-Chek FastClix Lancets MISC One tab po qd dx; e11.65   ACCU-CHEK GUIDE test strip Use to check BS qd dx: E11.65   Arginine 500 MG CAPS Take by mouth. Daily   aspirin 81 MG tablet Take 81 mg by mouth daily.   cholecalciferol (VITAMIN D) 1000 UNITS tablet Take 4,000 Units by mouth daily.   Cyanocobalamin (VITAMIN B 12) 250 MCG LOZG Take 1 tablet by mouth every other day.   fluticasone (CUTIVATE) 0.05 % cream Apply topically 2 (two) times daily.   [name] XR 50-1000 MG TB24 Take 1 tablet by mouth 2 (two) times daily.   levocetirizine (XYZAL) 5 MG tablet Take 1 tablet (5 mg total) by mouth every evening.   Magnesium Oxide 420 MG TABS Take 1 tablet by mouth daily.   Multiple Vitamin (MULTIVITAMIN) tablet Take 1 tablet by mouth daily.   olmesartan-hydrochlorothiazide (BENICAR HCT) 20-12.5 MG tablet Take 1 tablet by mouth daily.   rosuvastatin (CRESTOR) 10 MG tablet Take 1 tablet (10 mg total) by mouth every Monday, Tuesday, Wednesday, Thursday, and Friday for 20 days.   tadalafil (CIALIS) 5 MG tablet TAKE 1 TABLET BY MOUTH ONCE A DAY.   No facility-administered encounter medications on file as of 12/30/2021.  Contacted Andres [name] for General Review Call   Chart Review:  Have there been any documented new, changed, or discontinued medications since last visit? No (Patient was taking rosuvastatin 3 days weekly. Andres [name] instructed patient to take M-F on 12-01-2021 ) Has there been any documented recent hospitalizations or ED visits since last visit with Clinical Pharmacist? No Brief Summary: None  Adherence Review:  Does the Clinical  Pharmacist Assistant have access to adherence rates? Yes Adherence rates for STAR metric medications (Olmesartan/HCTZ 20-12.5 mg- Last filled 10-25-2021 90 DS. [name] XR 50-1000 mg- patient assistance. Rosuvastatin 10 mg last filled 01-07-2022 90 DS ([place]). Adherence rates for medications indicated for disease state being reviewed (None). Does the patient have >5 day gap between last estimated fill dates for any of the above medications or other medication gaps? No Reason for medication gaps.   Disease State Questions:  Able to connect with Patient? Yes  Did patient have any problems with their health recently? No  Have you had any admissions or emergency room visits or worsening of your condition(s) since last visit? No  Have you had any visits with new specialists or providers since your last visit? Yes Explain: Colonoscopy consult on 12-29-2021. Patient is to stop medications 7 days prior to colonoscopy  on 07-13  Have you had any new health care problem(s) since your last visit? No  Have you run out of any of your medications since you last spoke with clinical pharmacist? No  Are there any medications you are not taking as prescribed? No  Are you having any issues or side effects with your medications? No  Do you have any other health concerns or questions you want to discuss with your Clinical Pharmacist before your next visit? No  Are there any health concerns that you feel we can do a better job addressing? No  Are you having any problems  with any of the following since the last visit: (select all that apply)  None  12. Any falls since last visit? No   13. Any increased or uncontrolled pain since last visit? No   14. Next visit Type: office       Visit with: Dr. Benson [name]        Date: 01-20-2022        Time: 8:00  15. Additional Details? No    Care Gaps: Last AWV 11-25-2021 Covid booster overdue  Star Rating Drugs: [name] XR 50-1000 mg-  Patient assistance Rosuvastatin 10 mg- Last filled 12-07-2021 30 DS [facility] (System says filled 12-01-2021. Contacted pharmacy and was filled on 12-07-2021. Next refill is being processed.) Olmesartan/HCTZ 20-12.5 mg- Last filled 10-25-2021 90 DS [place] Clinical Pharmacist Assistant [phone]

## 2022-01-06 ENCOUNTER — Telehealth: Payer: Self-pay

## 2022-01-06 NOTE — Chronic Care Management (AMB) (Signed)
01-06-2022: Patient's appointment with [name] on 01-25-2022 was rescheduled to 03-04-2022 due to being out of the office. Informed patient I will be following up with him in between time.  [place] Pharmacist Assistant [phone]

## 2022-01-14 ENCOUNTER — Other Ambulatory Visit: Payer: Self-pay

## 2022-01-14 MED ORDER — FLUTICASONE PROPIONATE 0.05 % EX CREA: TOPICAL_CREAM | Freq: Two times a day (BID) | CUTANEOUS | 2 refills | Status: DC

## 2022-01-17 ENCOUNTER — Telehealth: Payer: Self-pay

## 2022-01-17 NOTE — Chronic Care Management (AMB) (Signed)
Chronic Care Management Pharmacy Assistant   Name: Andres [name]  MRN: [id number] DOB: [birth year]   Reason for Encounter: Disease State/ Diabetes  Recent office visits:  None  Recent consult visits:  12-31-2021 Carol Ada, MD Gertie Fey). START Clenpiq 12 g-3.5 g-10 mg take 175 mL twice daily.  Hospital visits:  None in previous 6 months  Medications: Outpatient Encounter Medications as of 01/17/2022  Medication Sig   Accu-Chek FastClix Lancets MISC One tab po qd dx; e11.65   ACCU-CHEK GUIDE test strip Use to check BS qd dx: E11.65   Arginine 500 MG CAPS Take by mouth. Daily   aspirin 81 MG tablet Take 81 mg by mouth daily.   cholecalciferol (VITAMIN D) 1000 UNITS tablet Take 4,000 Units by mouth daily.   Cyanocobalamin (VITAMIN B 12) 250 MCG LOZG Take 1 tablet by mouth every other day.   fluticasone (CUTIVATE) 0.05 % cream Apply topically 2 (two) times daily.   [name] XR 50-1000 MG TB24 Take 1 tablet by mouth 2 (two) times daily.   levocetirizine (XYZAL) 5 MG tablet Take 1 tablet (5 mg total) by mouth every evening.   Magnesium Oxide 420 MG TABS Take 1 tablet by mouth daily.   Multiple Vitamin (MULTIVITAMIN) tablet Take 1 tablet by mouth daily.   olmesartan-hydrochlorothiazide (BENICAR HCT) 20-12.5 MG tablet Take 1 tablet by mouth daily.   rosuvastatin (CRESTOR) 10 MG tablet Take 1 tablet (10 mg total) by mouth every Monday, Tuesday, Wednesday, Thursday, and Friday for 20 days.   tadalafil (CIALIS) 5 MG tablet TAKE 1 TABLET BY MOUTH ONCE A DAY.   No facility-administered encounter medications on file as of 01/17/2022.  Recent Relevant Labs: Lab Results  Component Value Date/Time   HGBA1C 6.7 (H) 11/15/2021 11:05 AM   HGBA1C 6.5 (H) 08/02/2021 11:18 AM   HGBA1C 7.7 02/13/2018 12:00 AM   MICROALBUR 30 11/07/2019 11:30 AM   MICROALBUR 10 10/30/2018 10:29 AM    Kidney Function Lab Results  Component Value Date/Time   CREATININE 1.19 11/15/2021 11:05 AM   CREATININE  1.20 08/02/2021 11:18 AM   GFRNONAA 62 06/29/2020 03:13 PM   GFRAA 71 06/29/2020 03:13 PM    Current antihyperglycemic regimen:  [name] XR 50-1000 mg twice daily  What recent interventions/DTPs have been made to improve glycemic control:  Educated on A1c and blood sugar goals; Benefits of routine self-monitoring of blood sugar; Continuous glucose monitoring; -Congratulated patient on meeting his A1c goals  -He would like to use CGM, because he will be able to monitor it more frequently  -Counseled to check feet daily and get yearly eye exams -Recommended to continue current medication  Have there been any recent hospitalizations or ED visits since last visit with CPP? No  Patient denies hypoglycemic symptoms  Patient denies hyperglycemic symptoms  How often are you checking your blood sugar? once daily but patient stated he hasn't checked blood sugar in a few weeks due to procedure. Will call patient next month for readings.  Are you checking your feet daily/regularly? Daily  Adherence Review: Is the patient currently on a STATIN medication? Yes Is the patient currently on ACE/ARB medication? Yes Does the patient have >5 day gap between last estimated fill dates? No    Care Gaps: Last AWV 11-25-2021 Covid booster overdue  Star Rating Drugs: [name] XR 50-1000 mg- Patient assistance Rosuvastatin 10 mg- Last filled 12-30-2021 28 DS  [place] 20-12.5 mg- Last filled 11-15-2021 90 DS [facility]  [place] Pharmacist Assistant [phone]

## 2022-01-20 DIAGNOSIS — K319 Disease of stomach and duodenum, unspecified: Secondary | ICD-10-CM | POA: Diagnosis not present

## 2022-01-20 DIAGNOSIS — K2980 Duodenitis without bleeding: Secondary | ICD-10-CM | POA: Diagnosis not present

## 2022-01-20 DIAGNOSIS — D122 Benign neoplasm of ascending colon: Secondary | ICD-10-CM | POA: Diagnosis not present

## 2022-01-20 DIAGNOSIS — K635 Polyp of colon: Secondary | ICD-10-CM | POA: Diagnosis not present

## 2022-01-20 DIAGNOSIS — D509 Iron deficiency anemia, unspecified: Secondary | ICD-10-CM | POA: Diagnosis not present

## 2022-01-20 DIAGNOSIS — K5730 Diverticulosis of large intestine without perforation or abscess without bleeding: Secondary | ICD-10-CM | POA: Diagnosis not present

## 2022-01-20 DIAGNOSIS — D123 Benign neoplasm of transverse colon: Secondary | ICD-10-CM | POA: Diagnosis not present

## 2022-01-20 DIAGNOSIS — Z1211 Encounter for screening for malignant neoplasm of colon: Secondary | ICD-10-CM | POA: Diagnosis not present

## 2022-01-20 LAB — HM COLONOSCOPY

## 2022-01-25 ENCOUNTER — Telehealth: Payer: Medicare HMO

## 2022-02-04 DIAGNOSIS — N401 Enlarged prostate with lower urinary tract symptoms: Secondary | ICD-10-CM | POA: Diagnosis not present

## 2022-02-04 DIAGNOSIS — R3912 Poor urinary stream: Secondary | ICD-10-CM | POA: Diagnosis not present

## 2022-02-04 DIAGNOSIS — Z125 Encounter for screening for malignant neoplasm of prostate: Secondary | ICD-10-CM | POA: Diagnosis not present

## 2022-02-04 DIAGNOSIS — N5201 Erectile dysfunction due to arterial insufficiency: Secondary | ICD-10-CM | POA: Diagnosis not present

## 2022-03-02 ENCOUNTER — Telehealth: Payer: Self-pay

## 2022-03-02 NOTE — Chronic Care Management (AMB) (Signed)
    Called Andres [name], No answer, left message of appointment on 03-02-2022 at 12:00 via telephone visit with [name], Pharm D. Notified to have all medications, supplements, blood pressure and/or blood sugar logs available during appointment and to return call if need to reschedule.   Care Gaps: Covid booster overdue Flu vaccine overdue  Star Rating Drug: [name] XR 50-1000 mg- Patient assistance Rosuvastatin 10 mg- Last filled 12-30-2021 28 DS  [name] (Contacted [facility] last filled 12-30-2021 28 DS) Olmesartan/HCTZ 20-12.5 mg- Last filled 11-15-2021 90 DS [facility] (Contacted [facility] last filled 11-15-2021 90 DS)  Any gaps in medications fill history? Yes   [place] Pharmacist Assistant [phone]

## 2022-03-03 NOTE — Progress Notes (Signed)
This encounter was created in error - please disregard.

## 2022-03-04 ENCOUNTER — Ambulatory Visit: Payer: Medicare HMO

## 2022-03-04 DIAGNOSIS — E1122 Type 2 diabetes mellitus with diabetic chronic kidney disease: Secondary | ICD-10-CM

## 2022-03-04 DIAGNOSIS — I10 Essential (primary) hypertension: Secondary | ICD-10-CM

## 2022-03-04 NOTE — Progress Notes (Signed)
Chronic Care Management Pharmacy Note  03/08/2022 Name:  Andres [name] MRN:  [id number] DOB:  04-12-1951  Summary: Patient reports that he is doing well and feels relieved since he has had further testing   Recommendations/Changes made from today's visit: Recommend patient monitor BS as he continues to exercise   Plan: Patient would like to start using CGM HC to follow up with patient to determine eligibility and cost.    Subjective: Andres [name] is an 71 y.o. year old male who is a primary patient of Glendale Chard, MD.  The CCM team was consulted for assistance with disease management and care coordination needs.    Engaged with patient by telephone for follow up visit in response to provider referral for pharmacy case management and/or care coordination services.   Consent to Services:  The patient was given information about Chronic Care Management services, agreed to services, and gave verbal consent prior to initiation of services.  Please see initial visit note for detailed documentation.   Patient Care Team: Glendale Chard, MD as PCP - General (Internal Medicine) Harl Bowie Alphonse Guild, MD as PCP - Cardiology (Cardiology) Rex Kras Claudette Stapler, RN as Case Manager Mayford Knife, [facility] (Pharmacist)  Recent office visits: 11/15/2021 PCP OV   Recent consult visits: 12/29/2021 Gastroenterology [facility] visits: None in previous 6 months   Objective:  Lab Results  Component Value Date   CREATININE 1.19 11/15/2021   BUN 13 11/15/2021   EGFR 67 11/15/2021   GFRNONAA 62 06/29/2020   GFRAA 71 06/29/2020   NA 139 11/15/2021   K 4.4 11/15/2021   CALCIUM 9.5 11/15/2021   CO2 25 11/15/2021   GLUCOSE 109 (H) 11/15/2021    Lab Results  Component Value Date/Time   HGBA1C 6.7 (H) 11/15/2021 11:05 AM   HGBA1C 6.5 (H) 08/02/2021 11:18 AM   HGBA1C 7.7 02/13/2018 12:00 AM   MICROALBUR 30 11/07/2019 11:30 AM   MICROALBUR 10 10/30/2018 10:29 AM    Last diabetic Eye  exam:  Lab Results  Component Value Date/Time   HMDIABEYEEXA No Retinopathy 06/28/2021 12:00 AM    Last diabetic Foot exam: No results found for: "HMDIABFOOTEX"   Lab Results  Component Value Date   CHOL 133 11/15/2021   HDL 32 (L) 11/15/2021   LDLCALC 78 11/15/2021   TRIG 130 11/15/2021   CHOLHDL 4.2 11/15/2021       Latest Ref Rng & Units 11/15/2021   11:05 AM 08/02/2021   11:18 AM 11/09/2020   12:22 PM  Hepatic Function  Total Protein 6.0 - 8.5 g/dL 7.3  7.4  7.4   Albumin 3.8 - 4.8 g/dL 4.4  4.5  4.5   AST 0 - 40 IU/L _0 ALT 0 - 44 IU/L _1 Alk Phosphatase 44 - 121 IU/L 58  59  57   Total Bilirubin 0.0 - 1.2 mg/dL 0.5  0.3  0.3     Lab Results  Component Value Date/Time   TSH 2.260 11/09/2020 12:22 PM   TSH 2.410 06/29/2020 03:13 PM       Latest Ref Rng & Units 11/15/2021   11:05 AM 11/09/2020   12:22 PM 11/07/2019   12:03 PM  CBC  WBC 3.4 - 10.8 x10E3/uL 6.4  7.7  8.2   Hemoglobin 13.0 - 17.7 g/dL 12.5  13.3  13.7   Hematocrit 37.5 - 51.0 % 36.8  39.7  41.1  Platelets 150 - 450 x10E3/uL 227  216  222     No results found for: "VD25OH"  Clinical ASCVD: No  The ASCVD Risk score (Arnett DK, et al., 2019) failed to calculate for the following reasons:   The systolic blood pressure is missing       11/25/2021   12:21 PM 11/15/2021    9:53 AM 11/12/2020   10:31 AM  Depression screen PHQ 2/9  Decreased Interest 0 0 0  Down, Depressed, Hopeless 0 0 0  PHQ - 2 Score 0 0 0       Social History   Tobacco Use  Smoking Status Former   Packs/day: 0.50   Years: 22.00   Total pack years: 11.00   Types: Cigarettes   Quit date: 07/11/2011   Years since quitting: 10.6  Smokeless Tobacco Never  Tobacco Comments   he has quit   BP Readings from Last 3 Encounters:  11/15/21 118/70  08/02/21 122/66  03/22/21 124/80   Pulse Readings from Last 3 Encounters:  11/15/21 82  08/02/21 (!) 101  03/22/21 85   Wt Readings from Last 3 Encounters:   11/25/21 230 lb (104.3 kg)  11/15/21 232 lb (105.2 kg)  08/02/21 228 lb 10.1 oz (103.7 kg)   BMI Readings from Last 3 Encounters:  11/25/21 32.08 kg/m  11/15/21 32.18 kg/m  08/02/21 31.71 kg/m    Assessment/Interventions: Review of patient past medical history, allergies, medications, health status, including review of consultants reports, laboratory and other test data, was performed as part of comprehensive evaluation and provision of chronic care management services.   SDOH:  (Social Determinants of Health) assessments and interventions performed: No  SDOH Screenings   Alcohol Screen: Not on file  Depression (PHQ2-9): Low Risk  (11/25/2021)   Depression (PHQ2-9)    PHQ-2 Score: 0  Financial Resource Strain: Low Risk  (11/25/2021)   Overall Financial Resource Strain (CARDIA)    Difficulty of Paying Living Expenses: Not hard at all  Food Insecurity: No Food Insecurity (11/25/2021)   Hunger Vital Sign    Worried About Running Out of Food in the Last Year: Never true    Ran Out of Food in the Last Year: Never true  Housing: Not on file  Physical Activity: Sufficiently Active (11/25/2021)   Exercise Vital Sign    Days of Exercise per Week: 3 days    Minutes of Exercise per Session: 50 min  Social Connections: Not on file  Stress: No Stress Concern Present (11/25/2021)   [place]    Feeling of Stress : Not at all  Tobacco Use: Medium Risk (11/25/2021)   Patient History    Smoking Tobacco Use: Former    Smokeless Tobacco Use: Never    Passive Exposure: Not on file  Transportation Needs: No Transportation Needs (11/25/2021)   PRAPARE - Hydrologist (Medical): No    Lack of Transportation (Non-Medical): No    CCM Care Plan  No Known Allergies  Medications Reviewed Today     Reviewed by Kellie Simmering, LPN (Licensed Practical Nurse) on 11/25/21 at 1217  Med List Status: <None>    Medication Order Taking? Sig Documenting Provider Last Dose Status Informant  Accu-Chek FastClix Lancets MISC [id number] Yes One tab po qd dx; e11.65 Glendale Chard, MD Taking Active   ACCU-CHEK GUIDE test strip [id number] Yes Use to check BS qd dx: E11.65 Glendale Chard, MD Taking Active  Arginine 500 MG CAPS [id number] Yes Take by mouth. Daily [provider] Taking Active Self  aspirin 81 MG tablet [id number] Yes Take 81 mg by mouth daily. [provider] Taking Active   cholecalciferol (VITAMIN D) 1000 UNITS tablet [id number] Yes Take 4,000 Units by mouth daily. [provider] Taking Active   Cyanocobalamin (VITAMIN B 12) 250 MCG LOZG [id number] Yes Take 1 tablet by mouth every other day. [provider] Taking Active   fluticasone (CUTIVATE) 0.05 % cream [id number] Yes Apply topically 2 (two) times daily. Glendale Chard, MD Taking Active   [name] XR 50-1000 MG TB24 [id number]  Take 1 tablet by mouth 2 (two) times daily. Glendale Chard, MD  Expired 11/15/21 2359   levocetirizine (XYZAL) 5 MG tablet [id number] Yes Take 1 tablet (5 mg total) by mouth every evening. Glendale Chard, MD Taking Active   Multiple Vitamin (MULTIVITAMIN) tablet [id number] Yes Take 1 tablet by mouth daily. [provider] Taking Active   olmesartan-hydrochlorothiazide (BENICAR HCT) 20-12.5 MG tablet [id number] Yes Take 1 tablet by mouth daily. Glendale Chard, MD Taking Active   rosuvastatin (CRESTOR) 10 MG tablet [id number] Yes One tab po qd Glendale Chard, MD Taking Active   tadalafil (CIALIS) 5 MG tablet [id number] Yes TAKE 1 TABLET BY MOUTH ONCE A DAY. Glendale Chard, MD Taking Active             Patient Active Problem List   Diagnosis Date Noted   Paresthesias 11/22/2020   Benign prostatic hyperplasia with urinary frequency 11/22/2020   Class 1 obesity due to excess calories with serious comorbidity and body mass index (BMI) of 33.0 to 33.9 in adult 11/22/2020   Type 2  diabetes mellitus with stage 2 chronic kidney disease, with long-term current use of insulin ([place]) 07/23/2018   Pure hypercholesterolemia 07/23/2018   HTN (hypertension) 07/23/2018   Hypertensive nephropathy 02/13/2018    Immunization History  Administered Date(s) Administered   Fluad Quad(high Dose 65+) 05/08/2020, 03/22/2021   Influenza, High Dose Seasonal PF 04/11/2018, 04/01/2019   Moderna Covid-19 Vaccine Bivalent Booster 63yr & up 09/07/2021   Moderna SARS-COV2 Booster Vaccination 05/22/2020   Moderna Sars-Covid-2 Vaccination 08/17/2019, 09/17/2019   PNEUMOCOCCAL CONJUGATE-20 08/02/2021   Pneumococcal Polysaccharide-23 07/23/2018   Tdap 04/17/2019   Zoster Recombinat (Shingrix) 04/19/2021, 08/30/2021    Conditions to be addressed/monitored:  Hyperlipidemia and Diabetes  Care Plan : [place] Updates made by PMayford Knife RJeromesince 03/08/2022 12:00 AM     Problem: HTN, DM II   Priority: High  Onset Date: 09/29/2020     Long-Range Goal: Disease State Management   Start Date: 09/29/2020  Recent Progress: On track  Priority: High  Note:    Current Barriers:  Unable to independently monitor therapeutic efficacy  Pharmacist Clinical Goal(s):  Patient will achieve adherence to monitoring guidelines and medication adherence to achieve therapeutic efficacy through collaboration with PharmD and provider.   Interventions: 1:1 collaboration with SGlendale Chard MD regarding development and update of comprehensive plan of care as evidenced by provider attestation and co-signature Inter-disciplinary care team collaboration (see longitudinal plan of care) Comprehensive medication review performed; medication list updated in electronic medical record  Hypertension (BP goal <130/80) -Controlled -Current treatment: Olmesartan -hydrochlorothiazide 20-12.5 mg tablet once per day Appropriate, Effective, Safe, Accessible_0 -Current home readings: he is checking it  but his readings have been normal  -Current dietary habits: eating more fresh fruit and vegetables  -Current exercise habits: he has been exercising and  walking. He joined an exercise club in [employer]. He can go there day or night, and he also goes to an instructor that helps him with what activities he should be doing. He is doing up to an hour per day. He also goes on the track  -Denies hypotensive/hypertensive symptoms -Educated on Importance of home blood pressure monitoring; Proper BP monitoring technique; -Counseled to monitor BP at home at least once per week , document, and provide log at future appointments -Recommended to continue current medication  Diabetes (A1c goal <7%) -Controlled -Current medications: [name] XR 50-1000 mg taking 1 tablet by mouth two times per day Appropriate, Effective, Safe, Accessible -Current home glucose readings fasting glucose: 8/21- 160, 112, 115 -Denies hypoglycemic/hyperglycemic symptoms -Current meal patterns: he has been eating more healthy  -Current exercise: he has been exercising and walking. He joined an exercise club in [employer]. He can go there day or night, and he also goes to an instructor that helps him with what activities he should be doing. He is doing up to an hour per day. He also goes on the track  -Educated on Prevention and management of hypoglycemic episodes; -Counseled to check feet daily and get yearly eye exams -Recommended to continue current medication  -Collaborate with PCP team to determine if patient is eligible for CGM   Patient Goals/Self-Care Activities Patient will:  - take medications as prescribed as evidenced by patient report and record review  Follow Up Plan: The patient has been provided with contact information for the care management team and has been advised to call with any health related questions or concerns.       Medication Assistance: None required.  Patient affirms current coverage  meets needs.  Compliance/Adherence/Medication fill history: Care Gaps: -COVID-19 Vaccine -Influenza Vaccine   Star-Rating Drugs: [name] XR 50-1000  Patient's preferred pharmacy is:  [facility], [place] [address] Phone: [phone] Fax: [phone]  [facility], [place] [address] Phone: [phone] Fax: [phone]  [facility] [address], [place] - [address] [place] [address] [place] [address] [place] [address] Phone: [phone] Fax: [phone]  Uses pill box? Yes Pt endorses 95% compliance  We discussed: Benefits of medication synchronization, packaging and delivery as well as enhanced pharmacist oversight with [employer]. Patient decided to: Continue current medication management strategy  Care Plan and Follow Up Patient Decision:  Patient agrees to Care Plan and Follow-up.  Plan: The patient has been provided with contact information for the care management team and has been advised to call with any health related questions or concerns.   [name], CPP, PharmD Clinical Pharmacist Practitioner [facility] [phone]

## 2022-03-08 ENCOUNTER — Telehealth: Payer: Self-pay | Admitting: *Deleted

## 2022-03-08 NOTE — Chronic Care Management (AMB) (Unsigned)
  Care Coordination  Outreach Note  03/08/2022 Name: Andres [name] MRN: [id number] DOB: 03-24-1951   Care Coordination Outreach Attempts  An unsuccessful telephone outreach was attempted today.   Follow Up Plan:  Additional outreach attempts will be made to offer the patient care coordination information and services.   Encounter Outcome:  No Answer  [place]  Direct Dial: [phone]

## 2022-03-08 NOTE — Patient Instructions (Addendum)
Visit Information It was great speaking with you today!  Please let me know if you have any questions about our visit.   Goals Addressed             This Visit's Progress    Manage My Medicine       Timeframe:  Long-Range Goal Priority:  High Start Date:                             Expected End Date:                       Follow Up Date 07/06/2022 In Progress:  - call for medicine refill 2 or 3 days before it runs out - call if I am sick and can't take my medicine - keep a list of all the medicines I take; vitamins and herbals too - learn to read medicine labels - use an alarm clock or phone to remind me to take my medicine    Why is this important?   These steps will help you keep on track with your medicines.  Notes: Please bring the following to our next appointment for patient assistance: your financial information, new insurance card (if applicable)         Patient Care Plan: [place] Plan     Problem Identified: HTN, DM II   Priority: High  Onset Date: 09/29/2020     Long-Range Goal: Disease State Management   Start Date: 09/29/2020  Recent Progress: On track  Priority: High  Note:    Current Barriers:  Unable to independently monitor therapeutic efficacy  Pharmacist Clinical Goal(s):  Patient will achieve adherence to monitoring guidelines and medication adherence to achieve therapeutic efficacy through collaboration with PharmD and provider.   Interventions: 1:1 collaboration with Glendale Chard, MD regarding development and update of comprehensive plan of care as evidenced by provider attestation and co-signature Inter-disciplinary care team collaboration (see longitudinal plan of care) Comprehensive medication review performed; medication list updated in electronic medical record  Hypertension (BP goal <130/80) -Controlled -Current treatment: Olmesartan -hydrochlorothiazide 20-12.5 mg tablet once per day Appropriate, Effective, Safe,  Accessible -Current home readings: he is checking it but his readings have been normal  -Current dietary habits: eating more fresh fruit and vegetables  -Current exercise habits: he has been exercising and walking. He joined an exercise club in [employer]. He can go there day or night, and he also goes to an instructor that helps him with what activities he should be doing. He is doing up to an hour per day. He also goes on the track  -Denies hypotensive/hypertensive symptoms -Educated on Importance of home blood pressure monitoring; Proper BP monitoring technique; -Counseled to monitor BP at home at least once per week , document, and provide log at future appointments -Recommended to continue current medication  Diabetes (A1c goal <7%) -Controlled -Current medications: [name] XR 50-1000 mg taking 1 tablet by mouth two times per day Appropriate, Effective, Safe, Accessible -Current home glucose readings fasting glucose: 8/21- 160, 112, 115 -Denies hypoglycemic/hyperglycemic symptoms -Current meal patterns: he has been eating more healthy  -Current exercise: he has been exercising and walking. He joined an exercise club in [employer]. He can go there day or night, and he also goes to an instructor that helps him with what activities he should be doing. He is doing up to an hour per day. He also goes  on the track  -Educated on Prevention and management of hypoglycemic episodes; -Counseled to check feet daily and get yearly eye exams -Recommended to continue current medication  -Collaborate with PCP team to determine if patient is eligible for CGM   Patient Goals/Self-Care Activities Patient will:  - take medications as prescribed as evidenced by patient report and record review  Follow Up Plan: The patient has been provided with contact information for the care management team and has been advised to call with any health related questions or concerns.       Patient agreed  to services and verbal consent obtained.   The patient verbalized understanding of instructions, educational materials, and care plan provided today and agreed to receive a mailed copy of patient instructions, educational materials, and care plan.   [name], PharmD Clinical Pharmacist [facility] [phone]

## 2022-03-09 NOTE — Chronic Care Management (AMB) (Signed)
  Care Coordination   Note   03/09/2022 Name: Andres [name] MRN: [id number] DOB: 09/19/1950  Andres [name] is a 72 y.o. year old male who sees Glendale Chard, MD for primary care. I reached out to [employer] by phone today to offer care coordination services.  [name] was given information about Care Coordination services today including:   The Care Coordination services include support from the care team which includes your Nurse Coordinator, Clinical Social Worker, or Pharmacist.  The Care Coordination team is here to help remove barriers to the health concerns and goals most important to you. Care Coordination services are voluntary, and the patient may decline or stop services at any time by request to their care team member.   Care Coordination Consent Status: Patient agreed to services and verbal consent obtained.   Follow up plan:  Telephone appointment with care coordination team member scheduled for:  03/28/22  Encounter Outcome:  Pt. Scheduled  [place]  Direct Dial: [phone]

## 2022-03-22 ENCOUNTER — Ambulatory Visit: Payer: Medicare HMO | Admitting: Internal Medicine

## 2022-03-28 ENCOUNTER — Ambulatory Visit: Payer: Self-pay

## 2022-03-28 NOTE — Patient Outreach (Signed)
  Care Coordination   Follow Up Visit Note   03/28/2022 Name: AMAIR SHROUT MRN: [id number] DOB: 24-Jun-1951  Rayvon Char Feldkamp is a 71 y.o. year old male who sees Glendale Chard, MD for primary care. I spoke with  Ayiden L Sailors by phone today.  What matters to the patients health and wellness today?  Patient would like to increase his exercise routinely.     Goals Addressed               This Visit's Progress     Patient Stated     I want to exercise more routinely (pt-stated)        Care Coordination Interventions: Evaluation of current treatment plan related to physical activity  and patient's adherence to plan as established by provider Determined patient has increased his walking, he would like to exercise more routinely Educated patient about the provider referral exercise program Determined patient would like to discuss this exercise program with his PCP at upcoming appointment  Positive reinforcement provided to patient for making efforts to improve his health         SDOH assessments and interventions completed:  No     Care Coordination Interventions Activated:  Yes  Care Coordination Interventions:  Yes, provided   Follow up plan: Follow up call scheduled for 05/27/22 '@09'$ :45 AM    Encounter Outcome:  Pt. Visit Completed

## 2022-03-28 NOTE — Patient Instructions (Signed)
Visit Information  Thank you for taking time to visit with me today. Please don't hesitate to contact me if I can be of assistance to you.   Following are the goals we discussed today:   Goals Addressed               This Visit's Progress     Patient Stated     I want to exercise more routinely (pt-stated)        Care Coordination Interventions: Evaluation of current treatment plan related to physical activity  and patient's adherence to plan as established by provider Determined patient has increased his walking, he would like to exercise more routinely Educated patient about the provider referral exercise program Determined patient would like to discuss this exercise program with his PCP at upcoming appointment  Positive reinforcement provided to patient for making efforts to improve his health            Our next appointment is by telephone on 05/27/22 at 09:45 AM  Please call the care guide team at [phone] if you need to cancel or reschedule your appointment.   If you are experiencing a Mental Health or [place] or need someone to talk to, please call 1-800-273-TALK (toll free, 24 hour hotline)  Patient verbalizes understanding of instructions and care plan provided today and agrees to view in [place]. Active MyChart status and patient understanding of how to access instructions and care plan via MyChart confirmed with patient.     Barb Merino, RN, BSN, CCM Care Management Coordinator [facility] Care Management  Direct Phone: [phone]

## 2022-03-30 ENCOUNTER — Ambulatory Visit (INDEPENDENT_AMBULATORY_CARE_PROVIDER_SITE_OTHER): Payer: Medicare HMO | Admitting: Internal Medicine

## 2022-03-30 ENCOUNTER — Encounter: Payer: Self-pay | Admitting: Internal Medicine

## 2022-03-30 VITALS — BP 120/78 | HR 101 | Temp 98.3°F | Ht 71.0 in | Wt 229.8 lb

## 2022-03-30 DIAGNOSIS — Z6832 Body mass index (BMI) 32.0-32.9, adult: Secondary | ICD-10-CM | POA: Diagnosis not present

## 2022-03-30 DIAGNOSIS — D500 Iron deficiency anemia secondary to blood loss (chronic): Secondary | ICD-10-CM

## 2022-03-30 DIAGNOSIS — I129 Hypertensive chronic kidney disease with stage 1 through stage 4 chronic kidney disease, or unspecified chronic kidney disease: Secondary | ICD-10-CM | POA: Diagnosis not present

## 2022-03-30 DIAGNOSIS — Z23 Encounter for immunization: Secondary | ICD-10-CM | POA: Diagnosis not present

## 2022-03-30 DIAGNOSIS — E1122 Type 2 diabetes mellitus with diabetic chronic kidney disease: Secondary | ICD-10-CM | POA: Diagnosis not present

## 2022-03-30 DIAGNOSIS — Z794 Long term (current) use of insulin: Secondary | ICD-10-CM | POA: Diagnosis not present

## 2022-03-30 DIAGNOSIS — N182 Chronic kidney disease, stage 2 (mild): Secondary | ICD-10-CM | POA: Diagnosis not present

## 2022-03-30 DIAGNOSIS — E6609 Other obesity due to excess calories: Secondary | ICD-10-CM | POA: Diagnosis not present

## 2022-03-30 NOTE — Patient Instructions (Signed)

## 2022-03-30 NOTE — Progress Notes (Signed)
Rich Brave [place]leton,acting as a Education administrator for [name], MD.,have documented all relevant documentation on the behalf of [name], MD,as directed by  [name], MD while in the presence of [name], MD.    Subjective:     Patient ID: Andres [name] , male    DOB: August 13, 1950 , 71 y.o.   MRN: [id number]   Chief Complaint  Patient presents with   Diabetes   Hypertension    HPI  He presents for DM/HTN follow-up. He reports compliance with meds. He denies headaches, chest pain and shortness of breath.  He is concerned because he had an elevated blood sugar last night and this morning. He does admit these are non-fasting readings. The readings were 160-170s.   Diabetes He presents for his follow-up diabetic visit. He has type 2 diabetes mellitus. His disease course has been stable. There are no hypoglycemic associated symptoms. Pertinent negatives for diabetes include no blurred vision, no chest pain, no polydipsia, no polyphagia and no polyuria. There are no hypoglycemic complications. Diabetic complications include nephropathy. Risk factors for coronary artery disease include diabetes mellitus, dyslipidemia, hypertension and male sex. He is following a diabetic diet. His breakfast blood glucose is taken between 8-9 am. His breakfast blood glucose range is generally 90-110 mg/dl. An ACE inhibitor/angiotensin II receptor blocker is being taken. Eye exam is not current.  Hypertension This is a chronic problem. The current episode started more than 1 year ago. The problem has been gradually improving since onset. The problem is controlled. Pertinent negatives include no blurred vision, chest pain, palpitations or shortness of breath. Past treatments include angiotensin blockers and diuretics. The current treatment provides moderate improvement.     Past Medical History:  Diagnosis Date   Diabetes ([place])    High cholesterol    Hypertension    states under control with  med., has been on med. x 5 yr.   Seasonal allergies    Umbilical hernia 10/4965     Family History  Problem Relation Age of Onset   Healthy Mother    Early death Father      Current Outpatient Medications:    Accu-Chek FastClix Lancets MISC, One tab po qd dx; e11.65, Disp: 100 each, Rfl: 11   ACCU-CHEK GUIDE test strip, Use to check BS qd dx: E11.65, Disp: 100 each, Rfl: 11   Arginine 500 MG CAPS, Take by mouth. Daily, Disp: , Rfl:    aspirin 81 MG tablet, Take 81 mg by mouth daily., Disp: , Rfl:    cholecalciferol (VITAMIN D) 1000 UNITS tablet, Take 4,000 Units by mouth daily., Disp: , Rfl:    Cyanocobalamin (VITAMIN B 12) 250 MCG LOZG, Take 1 tablet by mouth every other day., Disp: , Rfl:    Ferrous Sulfate (IRON PO), Take 1 capsule by mouth daily at 2 am., Disp: , Rfl:    fluticasone (CUTIVATE) 0.05 % cream, Apply topically 2 (two) times daily., Disp: 60 g, Rfl: 2   [name] XR 50-1000 MG TB24, Take 1 tablet by mouth 2 (two) times daily., Disp: 180 tablet, Rfl: 1   levocetirizine (XYZAL) 5 MG tablet, Take 1 tablet (5 mg total) by mouth every evening., Disp: 90 tablet, Rfl: 2   Magnesium Oxide 420 MG TABS, Take 1 tablet by mouth daily., Disp: , Rfl:    Multiple Vitamin (MULTIVITAMIN) tablet, Take 1 tablet by mouth daily., Disp: , Rfl:    olmesartan-hydrochlorothiazide (BENICAR HCT) 20-12.5 MG tablet, Take 1 tablet  by mouth daily., Disp: 90 tablet, Rfl: 2   rosuvastatin (CRESTOR) 10 MG tablet, Take 1 tablet (10 mg total) by mouth every Monday, Tuesday, Wednesday, Thursday, and Friday for 20 days., Disp: 20 tablet, Rfl: 2   tadalafil (CIALIS) 5 MG tablet, TAKE 1 TABLET BY MOUTH ONCE A DAY., Disp: 30 tablet, Rfl: 2   UNABLE TO FIND, Take 1 capsule by mouth daily at 2 am. Med Name: testosterone, Disp: , Rfl:    No Known Allergies   Review of Systems  Constitutional: Negative.   Eyes: Negative.  Negative for blurred vision.  Respiratory: Negative.  Negative for shortness of breath.    Cardiovascular: Negative.  Negative for chest pain and palpitations.  Gastrointestinal: Negative.   Endocrine: Negative for polydipsia, polyphagia and polyuria.  Musculoskeletal: Negative.   Skin: Negative.   Neurological: Negative.   Psychiatric/Behavioral: Negative.       Today's Vitals   03/30/22 1112  BP: 120/78  Pulse: (!) 101  Temp: 98.3 F (36.8 C)  Weight: 229 lb 12.8 oz (104.2 kg)  Height: 5\' 11"  (1.803 m)  PainSc: 0-No pain   Body mass index is 32.05 kg/m.  Wt Readings from Last 3 Encounters:  03/30/22 229 lb 12.8 oz (104.2 kg)  11/25/21 230 lb (104.3 kg)  11/15/21 232 lb (105.2 kg)     Objective:  Physical Exam Vitals and nursing note reviewed.  Constitutional:      Appearance: Normal appearance.  HENT:     Head: Normocephalic and atraumatic.  Eyes:     Extraocular Movements: Extraocular movements intact.  Cardiovascular:     Rate and Rhythm: Normal rate and regular rhythm.     Heart sounds: Normal heart sounds.  Pulmonary:     Effort: Pulmonary effort is normal.     Breath sounds: Normal breath sounds.  Musculoskeletal:     Cervical back: Normal range of motion.  Skin:    General: Skin is warm.  Neurological:     General: No focal deficit present.     Mental Status: He is alert.  Psychiatric:        Mood and Affect: Mood normal.       Assessment And Plan:     1. Type 2 diabetes mellitus with stage 2 chronic kidney disease, with long-term current use of insulin (HCC) Comments: Chronic, he will continue wiht [name] XR 50/1000 twice daily.  We discussed possibility of adding Farxiga 10mg  to his current regimen.  - Hemoglobin A1c - Amb Referral To Provider Referral Exercise Program (P.R.E.P)  2. Parenchymal renal hypertension, stage 1 through stage 4 or unspecified chronic kidney disease Comments: Chronic, well controlled. She will c/w olmesartan/hct 20/12.5mg  daily. Encouraged to follow low sodium diet.  - BMP8+EGFR - Amb Referral To Provider  Referral Exercise Program (P.R.E.P)  3. Iron deficiency anemia due to chronic blood loss Comments: Chronic, I will check iron panel today.  - CBC no Diff - Iron, TIBC and Ferritin Panel  4. Class 1 obesity due to excess calories with serious comorbidity and body mass index (BMI) of 32.0 to 32.9 in adult Comments: He is encouraged to aim for at least 150 minutes of exercise per week, while striving for BMI<29 to derease cardiac risk. - Amb Referral To Provider Referral Exercise Program (P.R.E.P)  5. Immunization due - Flu Vaccine QUAD High Dose(Fluad)   Patient was given opportunity to ask questions. Patient verbalized understanding of the plan and was able to repeat key elements of the plan. All  questions were answered to their satisfaction.   I, [name], MD, have reviewed all documentation for this visit. The documentation on 03/30/22 for the exam, diagnosis, procedures, and orders are all accurate and complete.   IF YOU HAVE BEEN REFERRED TO A SPECIALIST, IT MAY TAKE 1-2 WEEKS TO SCHEDULE/PROCESS THE REFERRAL. IF YOU HAVE NOT HEARD FROM US/SPECIALIST IN TWO WEEKS, PLEASE GIVE [place] A CALL AT [phone] X [phone].   THE PATIENT IS ENCOURAGED TO PRACTICE SOCIAL DISTANCING DUE TO THE COVID-19 PANDEMIC.

## 2022-03-31 LAB — IRON,TIBC AND FERRITIN PANEL
Ferritin: 106 ng/mL (ref 30–400)
Iron Saturation: 22 % (ref 15–55)
Iron: 71 ug/dL (ref 38–169)
Total Iron Binding Capacity: 330 ug/dL (ref 250–450)
UIBC: 259 ug/dL (ref 111–343)

## 2022-03-31 LAB — BMP8+EGFR
BUN/Creatinine Ratio: 12 (ref 10–24)
BUN: 17 mg/dL (ref 8–27)
CO2: 24 mmol/L (ref 20–29)
Calcium: 10.1 mg/dL (ref 8.6–10.2)
Chloride: 97 mmol/L (ref 96–106)
Creatinine, Ser: 1.46 mg/dL — ABNORMAL HIGH (ref 0.76–1.27)
Glucose: 162 mg/dL — ABNORMAL HIGH (ref 70–99)
Potassium: 4.5 mmol/L (ref 3.5–5.2)
Sodium: 139 mmol/L (ref 134–144)
eGFR: 52 mL/min/{1.73_m2} — ABNORMAL LOW (ref >59)

## 2022-03-31 LAB — CBC
Hematocrit: 41.6 % (ref 37.5–51.0)
Hemoglobin: 13.8 g/dL (ref 13.0–17.7)
MCH: 29.9 pg (ref 26.6–33.0)
MCHC: 33.2 g/dL (ref 31.5–35.7)
MCV: 90 fL (ref 79–97)
Platelets: 234 10*3/uL (ref 150–450)
RBC: 4.61 x10E6/uL (ref 4.14–5.80)
RDW: 12.7 % (ref 11.6–15.4)
WBC: 9 10*3/uL (ref 3.4–10.8)

## 2022-03-31 LAB — HEMOGLOBIN A1C
Est. average glucose Bld gHb Est-mCnc: 157 mg/dL
Hgb A1c MFr Bld: 7.1 % — ABNORMAL HIGH (ref 4.8–5.6)

## 2022-04-04 ENCOUNTER — Telehealth: Payer: Self-pay | Admitting: *Deleted

## 2022-04-04 NOTE — Telephone Encounter (Signed)
Patient contacted regarding PREP class referral. Left voice mail to return call.

## 2022-04-05 ENCOUNTER — Telehealth: Payer: Self-pay | Admitting: *Deleted

## 2022-04-05 NOTE — Telephone Encounter (Signed)
Spoke with patient regarding PREP class. He is interested in attending the next class at the [facility] in [place]. Will contact him once dates are finalized.

## 2022-04-18 ENCOUNTER — Telehealth: Payer: Self-pay | Admitting: *Deleted

## 2022-04-18 NOTE — Telephone Encounter (Signed)
Contacted patient regarding PREP Class referral. Accepted class start 06/07/2022 at the [facility]. Will contact close to start date to set up intake assessment appointment.

## 2022-04-27 DIAGNOSIS — D509 Iron deficiency anemia, unspecified: Secondary | ICD-10-CM | POA: Diagnosis not present

## 2022-05-09 ENCOUNTER — Telehealth: Payer: Self-pay | Admitting: *Deleted

## 2022-05-09 NOTE — Telephone Encounter (Signed)
Contacted to arrange intake assessment visit for PREP class. Scheduled for 11/16 at 1200 at the [facility].

## 2022-05-11 ENCOUNTER — Other Ambulatory Visit: Payer: Self-pay

## 2022-05-11 MED ORDER — ROSUVASTATIN CALCIUM 10 MG PO TABS: 10.0000 mg | ORAL_TABLET | ORAL | 2 refills | Status: DC

## 2022-05-26 ENCOUNTER — Encounter: Payer: Self-pay | Admitting: *Deleted

## 2022-05-26 NOTE — Progress Notes (Signed)
[facility] PREP Evaluation  Patient Details  Name: Andres [name] MRN: [id number] Date of Birth: September 12, 1950 Age: 71 y.o. PCP: Glendale Chard, MD  Vitals:   05/26/22 1245  BP: 118/64  Pulse: 93  Resp: 20  SpO2: 98%  Weight: 232 lb 12.8 oz (105.6 kg)  Height: 6' (1.829 m)     [facility] Eval - 05/26/22 1245       [facility] "PREP" Location   [facility] "PREP" Location [place]      Referral    Referring Provider [name]    Reason for referral Diabetes;High Cholesterol;Hypertension;Obesitity/Overweight;Inactivity    Program Start Date 06/07/22      Measurement   Neck measurement 16.25 Inches    Waist Circumference 47.25 inches    Hip Circumference 45 inches    Body fat 34 percent      Information for Trainer   Goals weight loss 5-7lbs in 12 weeks, establish exercise routine,increase stamina, decrease medications for HTN and DM    Current Exercise none    Orthopedic Concerns Left hip replacement, right knee patellar injury   self reported   Current Barriers "visits family in [place]"    Restrictions/Precautions Diabetic snack before exercise      Mobility and Daily Activities   I find it easy to walk up or down two or more flights of stairs. 4    I have no trouble taking out the trash. 4    I do housework such as vacuuming and dusting on my own without difficulty. 4    I can easily lift a gallon of milk (8lbs). 4    I can easily walk a mile. 4    I have no trouble reaching into high cupboards or reaching down to pick up something from the floor. 4    I do not have trouble doing out-door work such as Armed forces logistics/support/administrative officer, raking leaves, or gardening. 4      Mobility and Daily Activities   I feel younger than my age. 4    I feel independent. 4    I feel energetic. 4    I live an active life.  3    I feel strong. 4    I feel healthy. 4    I feel active as other people my age. 4      How fit and strong are you.   Fit and Strong Total Score 55            Past Medical  History:  Diagnosis Date   Diabetes ([place])    High cholesterol    Hypertension    states under control with med., has been on med. x 5 yr.   Seasonal allergies    Umbilical hernia 11/1023   Past Surgical History:  Procedure Laterality Date   INSERTION OF MESH N/A 09/25/2014   Procedure: INSERTION OF MESH;  Surgeon: Donnie Mesa, MD;  Location: [place];  Service: General;  Laterality: N/A;   ORIF PATELLA FRACTURE Right 06/16/2008   TOTAL HIP ARTHROPLASTY Left 02/12/2777   UMBILICAL HERNIA REPAIR N/A 09/25/2014   Procedure: UMBILICAL HERNIA REPAIR WITH MESH;  Surgeon: Donnie Mesa, MD;  Location: [place];  Service: General;  Laterality: N/A;   Social History   Tobacco Use  Smoking Status Former   Packs/day: 0.50   Years: 22.00   Total pack years: 11.00   Types: Cigarettes   Quit date: 07/11/2011   Years since quitting: 10.8  Smokeless Tobacco  Never  Tobacco Comments   he has quit    [name] 05/26/2022, 10:07 PM   PREP Class to begin 06/07/2022 every Tuesday/Thursday from 5038-8828.

## 2022-06-07 ENCOUNTER — Encounter: Payer: Self-pay | Admitting: *Deleted

## 2022-06-07 ENCOUNTER — Telehealth: Payer: Self-pay

## 2022-06-07 NOTE — Chronic Care Management (AMB) (Signed)
Mailed patient assistance forms to [employer] patient assistance program 05/25/2022.    [name], [place] Pharmacist Assistant [phone]'

## 2022-06-07 NOTE — Progress Notes (Signed)
[facility] PREP Weekly Session  Patient Details  Name: Andres [name] MRN: [id number] Date of Birth: Feb 25, 1951 Age: 71 y.o. PCP: Glendale Chard, MD  Vitals:   06/07/22 1600  Weight: 232 lb (105.2 kg)     [facility] Weekly seesion - 06/07/22 1600       [facility] "PREP" Location   [facility] "PREP" Location [place]      Weekly Session   Topic Discussed Goal setting and welcome to the program   Introductions, workbook review, facility tour   Classes attended to date [place], [place] 06/07/2022, 10:16 PM

## 2022-06-14 ENCOUNTER — Ambulatory Visit: Payer: Self-pay

## 2022-06-14 NOTE — Patient Instructions (Signed)
Visit Information  Thank you for taking time to visit with me today. Please don't hesitate to contact me if I can be of assistance to you.   Following are the goals we discussed today:   Goals Addressed               This Visit's Progress     Patient Stated     COMPLETED: I want to exercise more routinely (pt-stated)        Care Coordination Interventions: Evaluation of current treatment plan related to physical activity  and patient's adherence to plan as established by provider Placed successful outbound call to patient Determined patient has started participating in the PREP program through the [facility], he has attended 3 sessions Positive reinforcement given to patient for making efforts to improve his overall health  Reviewed upcoming scheduled follow up with PCP [name], scheduled for 08/01/22 '@140'$  PM         Our next appointment is by telephone on 08/03/22 at 0930 AM  Please call the care guide team at [phone] if you need to cancel or reschedule your appointment.   If you are experiencing a Mental Health or [place] or need someone to talk to, please call 1-800-273-TALK (toll free, 24 hour hotline)  Patient verbalizes understanding of instructions and care plan provided today and agrees to view in [place]. Active MyChart status and patient understanding of how to access instructions and care plan via MyChart confirmed with patient.     Barb Merino, RN, BSN, CCM Care Management Coordinator [facility] Care Management Direct Phone: [phone]

## 2022-06-14 NOTE — Patient Outreach (Signed)
  Care Coordination   Follow Up Visit Note   06/14/2022 Name: Andres [name] MRN: [id number] DOB: 1951/04/21  Andres [name] is a 71 y.o. year old male who sees Glendale Chard, MD for primary care. I spoke with  Andres [name] by phone today.  What matters to the patients health and wellness today?  Patient will continue to increase his exercise by participating in the PREP program.     Goals Addressed               This Visit's Progress     Patient Stated     COMPLETED: I want to exercise more routinely (pt-stated)        Care Coordination Interventions: Evaluation of current treatment plan related to physical activity  and patient's adherence to plan as established by provider Placed successful outbound call to patient Determined patient has started participating in the PREP program through the [facility], he has attended 3 sessions Positive reinforcement given to patient for making efforts to improve his overall health  Reviewed upcoming scheduled follow up with PCP [name], scheduled for 08/01/22 '@140'$  PM         SDOH assessments and interventions completed:  No     Care Coordination Interventions:  Yes, provided   Follow up plan: Follow up call scheduled for 08/03/22 '@0930'$  AM    Encounter Outcome:  Pt. Visit Completed

## 2022-06-22 ENCOUNTER — Encounter: Payer: Self-pay | Admitting: *Deleted

## 2022-06-22 ENCOUNTER — Telehealth: Payer: Self-pay

## 2022-06-22 NOTE — Progress Notes (Signed)
[facility] PREP Weekly Session  Patient Details  Name: Andres [name] MRN: [id number] Date of Birth: 27-Nov-1950 Age: 71 y.o. PCP: Glendale Chard, MD  Vitals:   06/21/22 1600  Weight: 231 lb 9.6 oz (105.1 kg)     [facility] Weekly seesion - 06/21/22 1600       [facility] "PREP" Location   [facility]      Weekly Session   Topic Discussed Eating for the season;Healthy eating tips   Daily intake for salt and sugar, discussed importance of healthy carbohydrates, proteins and fats.   Minutes exercised this week 255 minutes    Classes attended to date [place], [place] 06/22/2022, 9:47 AM

## 2022-06-22 NOTE — Telephone Encounter (Signed)
  Care Management   Follow Up Note   06/22/2022 Name: Andres [name] MRN: [id number] DOB: March 13, 1951   Referred by: Glendale Chard, MD Reason for referral : Chronic Care Management (Patient assistance question in regards to letter received  )   [name] called today in regards to his patient assistance application.   He reports receiving a letter requesting more information. I asked [name] to bring in the letter to allow [place] to review next steps. Mr. Full voiced understanding.     Follow Up Plan: The patient has been provided with contact information for the care management team and has been advised to call with any health related questions or concerns.   [name], CPP, PharmD Clinical Pharmacist Practitioner [facility] [phone]

## 2022-06-23 ENCOUNTER — Telehealth: Payer: Self-pay

## 2022-06-23 NOTE — Chronic Care Management (AMB) (Signed)
Mailed patient attestation form signed by patient to [employer] Patient assistance program.    [name], [place] Pharmacist Assistant [phone]

## 2022-06-28 ENCOUNTER — Encounter: Payer: Self-pay | Admitting: *Deleted

## 2022-06-28 NOTE — Progress Notes (Signed)
[facility] PREP Weekly Session  Patient Details  Name: Andres [name] MRN: [id number] Date of Birth: 01-24-1951 Age: 71 y.o. PCP: Glendale Chard, MD  Vitals:   06/28/22 1638  Weight: 230 lb (104.3 kg)     [facility] Weekly seesion - 06/28/22 1600       [facility] "PREP" Location   [facility] "PREP" Location [place]      Weekly Session   Topic Discussed Health habits;Water   Water intake: 1/2 body weight in oz or 64oz, sugar demo, Ways to reduce sugar. Added sugars: 24gms/day for women, 36gms/day for men.   Minutes exercised this week 50 minutes    Classes attended to date [place], [place] 06/28/2022, 4:40 PM

## 2022-06-29 DIAGNOSIS — E119 Type 2 diabetes mellitus without complications: Secondary | ICD-10-CM | POA: Diagnosis not present

## 2022-06-29 LAB — HM DIABETES EYE EXAM

## 2022-06-30 ENCOUNTER — Telehealth: Payer: Self-pay

## 2022-06-30 NOTE — Chronic Care Management (AMB) (Signed)
06-30-2022: Left patient a VM to call and reschedule 07-06-2022 appointment with [name]. Appointment was canceled.  [place] Pharmacist Assistant [phone]

## 2022-07-06 ENCOUNTER — Telehealth: Payer: Self-pay

## 2022-07-12 ENCOUNTER — Encounter: Payer: Self-pay | Admitting: *Deleted

## 2022-07-12 NOTE — Progress Notes (Signed)
[facility] PREP Weekly Session  Patient Details  Name: Andres [name] MRN: [id number] Date of Birth: [birth year] Age: 72 y.o. PCP: Glendale Chard, MD  Vitals:   07/12/22 1656  Weight: 231 lb 9.6 oz (105.1 kg)     [facility] Weekly seesion - 07/12/22 1600       [facility] "PREP" Location   [facility] "PREP" Location [facility]      Weekly Session   Topic Discussed Restaurant Eating   Label review, salt intake '1500mg'$ -'2300mg'$ /day salt demo.   Classes attended to date [place], [place] 07/12/2022, 4:58 PM

## 2022-07-19 ENCOUNTER — Encounter: Payer: Self-pay | Admitting: *Deleted

## 2022-07-19 NOTE — Progress Notes (Signed)
[facility] PREP Weekly Session  Patient Details  Name: Andres [name] MRN: [id number] Date of Birth: [birth year] Age: 72 y.o. PCP: Glendale Chard, MD  Vitals:   07/19/22 1624  Weight: 229 lb 11.2 oz (104.2 kg)     [facility] Weekly seesion - 07/19/22 1600       [facility] "PREP" Location   [facility] "PREP" Location [facility]      Weekly Session   Topic Discussed Stress management and problem solving;Other   Importance of sleep and tips for improving sleep quality. Introduction to meditation.   Minutes exercised this week 70 minutes    Classes attended to date [place], [place] 07/19/2022, 4:32 PM

## 2022-07-26 ENCOUNTER — Encounter: Payer: Self-pay | Admitting: *Deleted

## 2022-07-26 NOTE — Progress Notes (Signed)
[facility] PREP Weekly Session  Patient Details  Name: Andres [name] MRN: [id number] Date of Birth: Jul 06, 1951 Age: 72 y.o. PCP: Glendale Chard, MD  Vitals:   07/26/22 1600  Weight: 230 lb 3.2 oz (104.4 kg)     [facility] Weekly seesion - 07/26/22 1600       [facility] "PREP" Location   [facility] "PREP" Location [facility]      Weekly Session   Topic Discussed Expectations and non-scale victories   Half-way through program: revist/restate goals, identify barriers, Staying positive. Discuss Blue Zones and their comonalities that increase good health and longevity.   Minutes exercised this week 60 minutes    Classes attended to date [place], [place] 07/26/2022, 8:49 PM

## 2022-08-01 ENCOUNTER — Encounter: Payer: Self-pay | Admitting: Internal Medicine

## 2022-08-01 ENCOUNTER — Ambulatory Visit (INDEPENDENT_AMBULATORY_CARE_PROVIDER_SITE_OTHER): Payer: Medicare HMO | Admitting: Internal Medicine

## 2022-08-01 VITALS — BP 136/80 | HR 76 | Temp 98.3°F | Ht 72.0 in | Wt 233.0 lb

## 2022-08-01 DIAGNOSIS — Z6831 Body mass index (BMI) 31.0-31.9, adult: Secondary | ICD-10-CM | POA: Diagnosis not present

## 2022-08-01 DIAGNOSIS — Z794 Long term (current) use of insulin: Secondary | ICD-10-CM | POA: Diagnosis not present

## 2022-08-01 DIAGNOSIS — E7800 Pure hypercholesterolemia, unspecified: Secondary | ICD-10-CM | POA: Diagnosis not present

## 2022-08-01 DIAGNOSIS — N182 Chronic kidney disease, stage 2 (mild): Secondary | ICD-10-CM | POA: Diagnosis not present

## 2022-08-01 DIAGNOSIS — I129 Hypertensive chronic kidney disease with stage 1 through stage 4 chronic kidney disease, or unspecified chronic kidney disease: Secondary | ICD-10-CM

## 2022-08-01 DIAGNOSIS — E6609 Other obesity due to excess calories: Secondary | ICD-10-CM | POA: Diagnosis not present

## 2022-08-01 DIAGNOSIS — E1122 Type 2 diabetes mellitus with diabetic chronic kidney disease: Secondary | ICD-10-CM | POA: Diagnosis not present

## 2022-08-01 NOTE — Patient Instructions (Signed)

## 2022-08-01 NOTE — Progress Notes (Signed)
Rich Brave [place],acting as a Education administrator for [name], MD.,have documented all relevant documentation on the behalf of [name], MD,as directed by  [name], MD while in the presence of [name], MD.    Subjective:     Patient ID: Andres [name] , male    DOB: Aug 27, 1950 , 72 y.o.   MRN: [id number]   Chief Complaint  Patient presents with   Diabetes   Hypertension    HPI  He presents for DM/HTN follow-up. He reports compliance with meds. He denies headaches, chest pain and shortness of breath.    He was given samples of Jardiance '10mg'$  at his last visit. He has tolerated the medication w/o any issues. He has noticed increased urinary frequency.   Diabetes He presents for his follow-up diabetic visit. He has type 2 diabetes mellitus. His disease course has been stable. There are no hypoglycemic associated symptoms. Pertinent negatives for diabetes include no blurred vision, no chest pain, no polydipsia, no polyphagia and no polyuria. There are no hypoglycemic complications. Diabetic complications include nephropathy. Risk factors for coronary artery disease include diabetes mellitus, dyslipidemia, hypertension and male sex. He is following a diabetic diet. His breakfast blood glucose is taken between 8-9 am. His breakfast blood glucose range is generally 90-110 mg/dl. An ACE inhibitor/angiotensin II receptor blocker is being taken. Eye exam is not current.  Hypertension This is a chronic problem. The current episode started more than 1 year ago. The problem has been gradually improving since onset. The problem is controlled. Pertinent negatives include no blurred vision, chest pain, palpitations or shortness of breath. Past treatments include angiotensin blockers and diuretics. The current treatment provides moderate improvement.     Past Medical History:  Diagnosis Date   Diabetes ([place])    High cholesterol    Hypertension    states under control with med.,  has been on med. x 5 yr.   Seasonal allergies    Umbilical hernia 07/1029     Family History  Problem Relation Age of Onset   Healthy Mother    Early death Father      Current Outpatient Medications:    Accu-Chek FastClix Lancets MISC, One tab po qd dx; e11.65, Disp: 100 each, Rfl: 11   ACCU-CHEK GUIDE test strip, Use to check BS qd dx: E11.65, Disp: 100 each, Rfl: 11   Arginine 500 MG CAPS, Take by mouth. Daily, Disp: , Rfl:    aspirin 81 MG tablet, Take 81 mg by mouth daily., Disp: , Rfl:    cholecalciferol (VITAMIN D) 1000 UNITS tablet, Take 4,000 Units by mouth daily., Disp: , Rfl:    Cyanocobalamin (VITAMIN B 12) 250 MCG LOZG, Take 1 tablet by mouth every other day., Disp: , Rfl:    Ferrous Sulfate (IRON PO), Take 1 capsule by mouth daily at 2 am., Disp: , Rfl:    fluticasone (CUTIVATE) 0.05 % cream, Apply topically 2 (two) times daily., Disp: 60 g, Rfl: 2   [name] XR 50-1000 MG TB24, Take 1 tablet by mouth 2 (two) times daily., Disp: 180 tablet, Rfl: 1   levocetirizine (XYZAL) 5 MG tablet, Take 1 tablet (5 mg total) by mouth every evening., Disp: 90 tablet, Rfl: 2   Magnesium Oxide 420 MG TABS, Take 1 tablet by mouth daily., Disp: , Rfl:    Multiple Vitamin (MULTIVITAMIN) tablet, Take 1 tablet by mouth daily., Disp: , Rfl:    olmesartan-hydrochlorothiazide (BENICAR HCT) 20-12.5 MG tablet, Take 1  tablet by mouth daily., Disp: 90 tablet, Rfl: 2   rosuvastatin (CRESTOR) 10 MG tablet, Take 1 tablet (10 mg total) by mouth every Monday, Tuesday, Wednesday, Thursday, and Friday for 20 days., Disp: 60 tablet, Rfl: 2   tadalafil (CIALIS) 5 MG tablet, TAKE 1 TABLET BY MOUTH ONCE A DAY., Disp: 30 tablet, Rfl: 2   UNABLE TO FIND, Take 1 capsule by mouth daily at 2 am. Med Name: testosterone, Disp: , Rfl:    No Known Allergies   Review of Systems  Constitutional: Negative.   Eyes: Negative.  Negative for blurred vision.  Respiratory: Negative.  Negative for shortness of breath.    Cardiovascular: Negative.  Negative for chest pain and palpitations.  Gastrointestinal: Negative.   Endocrine: Negative for polydipsia, polyphagia and polyuria.  Musculoskeletal: Negative.   Skin: Negative.   Neurological: Negative.   Psychiatric/Behavioral: Negative.       Today's Vitals   08/01/22 1042  BP: 136/80  Pulse: 76  Temp: 98.3 F (36.8 C)  TempSrc: Oral  Weight: 233 lb (105.7 kg)  Height: 6' (1.829 m)   Body mass index is 31.6 kg/m.  BP Readings from Last 3 Encounters:  08/01/22 136/80  05/26/22 118/64  03/30/22 120/78   Wt Readings from Last 3 Encounters:  08/01/22 233 lb (105.7 kg)  07/26/22 230 lb 3.2 oz (104.4 kg)  07/19/22 229 lb 11.2 oz (104.2 kg)    Objective:  Physical Exam Vitals and nursing note reviewed.  Constitutional:      Appearance: Normal appearance.  HENT:     Head: Normocephalic and atraumatic.     Nose:     Comments: Masked     Mouth/Throat:     Comments: Masked  Cardiovascular:     Rate and Rhythm: Normal rate and regular rhythm.     Heart sounds: Normal heart sounds.  Pulmonary:     Effort: Pulmonary effort is normal.     Breath sounds: Normal breath sounds.  Musculoskeletal:     Cervical back: Normal range of motion.  Skin:    General: Skin is warm.  Neurological:     General: No focal deficit present.     Mental Status: He is alert.  Psychiatric:        Mood and Affect: Mood normal.      Assessment And Plan:     1. Type 2 diabetes mellitus with stage 2 chronic kidney disease, with long-term current use of insulin (HCC) Comments: Chronic, I will check labs as below. He will c/w [name] twice daily and Jardiance '10mg'$  for now. I will contact [facility] pharmacist re: PA. He will f/u in 3 months. - CMP14+EGFR - Hemoglobin A1c  2. Parenchymal renal hypertension, stage 1 through stage 4 or unspecified chronic kidney disease Comments: Chronic, not yet at goal. Goal BP<130/80. He is encouraged to aim for at least 150 minutes  of exercise/week and to follow low sodium diet. He will c/w olmesartan/hct 20/12.'5mg'$  daily for now. If needed, I will add amlodipine 2.'5mg'$  nightly to his regimen at future visit.  - CMP14+EGFR  3. Pure hypercholesterolemia Comments: Chronic, admits he has not been 100% compliant w/ rosuvastatin. Will defer checking lipid panel until his next visit May 2024. - CMP14+EGFR  4. Class 1 obesity due to excess calories with serious comorbidity and body mass index (BMI) of 31.0 to 31.9 in adult  He is encouraged to initially strive for BMI less than 30 to decrease cardiac risk. He is advised to exercise  no less than 150 minutes per week.   Patient was given opportunity to ask questions. Patient verbalized understanding of the plan and was able to repeat key elements of the plan. All questions were answered to their satisfaction.   I, [name], MD, have reviewed all documentation for this visit. The documentation on 08/01/22 for the exam, diagnosis, procedures, and orders are all accurate and complete.   IF YOU HAVE BEEN REFERRED TO A SPECIALIST, IT MAY TAKE 1-2 WEEKS TO SCHEDULE/PROCESS THE REFERRAL. IF YOU HAVE NOT HEARD FROM US/SPECIALIST IN TWO WEEKS, PLEASE GIVE [place] A CALL AT [phone].   THE PATIENT IS ENCOURAGED TO PRACTICE SOCIAL DISTANCING DUE TO THE COVID-19 PANDEMIC.

## 2022-08-02 ENCOUNTER — Encounter: Payer: Self-pay | Admitting: *Deleted

## 2022-08-02 LAB — CMP14+EGFR
ALT: 29 IU/L (ref 0–44)
AST: 23 IU/L (ref 0–40)
Albumin/Globulin Ratio: 1.5 (ref 1.2–2.2)
Albumin: 4.5 g/dL (ref 3.9–4.9)
Alkaline Phosphatase: 61 IU/L (ref 44–121)
BUN/Creatinine Ratio: 10 (ref 10–24)
BUN: 12 mg/dL (ref 8–27)
Bilirubin Total: 0.3 mg/dL (ref 0.0–1.2)
CO2: 23 mmol/L (ref 20–29)
Calcium: 9.5 mg/dL (ref 8.6–10.2)
Chloride: 98 mmol/L (ref 96–106)
Creatinine, Ser: 1.23 mg/dL (ref 0.76–1.27)
Globulin, Total: 3 g/dL (ref 1.5–4.5)
Glucose: 101 mg/dL — ABNORMAL HIGH (ref 70–99)
Potassium: 4.4 mmol/L (ref 3.5–5.2)
Sodium: 139 mmol/L (ref 134–144)
Total Protein: 7.5 g/dL (ref 6.0–8.5)
eGFR: 64 mL/min/{1.73_m2} (ref >59)

## 2022-08-02 LAB — HEMOGLOBIN A1C
Est. average glucose Bld gHb Est-mCnc: 151 mg/dL
Hgb A1c MFr Bld: 6.9 % — ABNORMAL HIGH (ref 4.8–5.6)

## 2022-08-02 NOTE — Progress Notes (Signed)
[facility] PREP Weekly Session  Patient Details  Name: Andres [name] MRN: [id number] Date of Birth: 02-28-1951 Age: 72 y.o. PCP: Glendale Chard, MD  Vitals:   08/02/22 1400  Weight: 230 lb 6.4 oz (104.5 kg)     [facility] Weekly seesion - 08/02/22 1600       [facility] "PREP" Location   [facility] "PREP" Location [facility]      Weekly Session   Topic Discussed Other   Portion size matters, tips for managing portion control, portion size demo, deceptive food label review.   Minutes exercised this week 70 minutes    Classes attended to date [place], [place] 08/02/2022, 8:47 PM

## 2022-08-09 ENCOUNTER — Encounter: Payer: Self-pay | Admitting: *Deleted

## 2022-08-09 NOTE — Progress Notes (Signed)
[facility] PREP Weekly Session  Patient Details  Name: Andres [name] MRN: [id number] Date of Birth: May 15, 1951 Age: 72 y.o. PCP: Glendale Chard, MD  Vitals:   08/09/22 1400  Weight: 230 lb 3.2 oz (104.4 kg)     [facility] Weekly seesion - 08/09/22 1600       [facility] "PREP" Location   [facility] "PREP" Location [facility]      Weekly Session   Topic Discussed Finding support   Staying positive, maintaining internal accountability, external support and accountability partners.   Minutes exercised this week 70 minutes    Classes attended to date [place], [place] 08/09/2022, 8:45 PM

## 2022-08-16 ENCOUNTER — Encounter: Payer: Self-pay | Admitting: *Deleted

## 2022-08-16 NOTE — Progress Notes (Signed)
[facility] PREP Weekly Session  Patient Details  Name: Andres [name] MRN: [id number] Date of Birth: 1951/01/05 Age: 72 y.o. PCP: Glendale Chard, MD  Vitals:   08/16/22 1400  Weight: 230 lb 1.6 oz (104.4 kg)     [facility] Weekly seesion - 08/16/22 1600       [facility] "PREP" Location   [facility] "PREP[facility] Location [facility]      Weekly Session   Topic Discussed Calorie breakdown   Macronutrient review, importance of protein for building muscle. [facility] membership talk by Ronie Spies. Discuss preparation of goals and activities sheet for final assessment.   Minutes exercised this week 75 minutes    Classes attended to date [place], [place] 08/16/2022, 10:01 PM

## 2022-08-17 ENCOUNTER — Ambulatory Visit: Payer: Self-pay

## 2022-08-17 NOTE — Patient Outreach (Signed)
  Care Coordination   Follow Up Visit Note   08/17/2022 Name: Andres [name] MRN: [id number] DOB: 18-Dec-1950  Andres [name] is a 72 y.o. year old male who sees Glendale Chard, MD for primary care. I spoke with  Andres [name] by phone today.  What matters to the patients health and wellness today?  Patient would like to work on lowering his A1c <6.5 %.     Goals Addressed               This Visit's Progress     Patient Stated     To lower A1c<6.5 % (pt-stated)        Care Coordination Interventions: Provided education to patient about basic DM disease process Advised patient, providing education and rationale, to check cbg daily before breakfast and at bedtime and record, calling PCP for findings outside established parameters Review of patient status, including review of consultants reports, relevant laboratory and other test results, and medications completed Mailed printed educational materials related to Diabetes management            SDOH assessments and interventions completed:  No     Care Coordination Interventions:  Yes, provided   Follow up plan: Follow up call scheduled for 11/30/22 '@09'$ :30 AM    Encounter Outcome:  Pt. Visit Completed

## 2022-08-17 NOTE — Patient Instructions (Signed)
Visit Information  Thank you for taking time to visit with me today. Please don't hesitate to contact me if I can be of assistance to you.   Following are the goals we discussed today:   Goals Addressed               This Visit's Progress     Patient Stated     To lower A1c<6.5 % (pt-stated)        Care Coordination Interventions: Provided education to patient about basic DM disease process Advised patient, providing education and rationale, to check cbg daily before breakfast and at bedtime and record, calling PCP for findings outside established parameters Review of patient status, including review of consultants reports, relevant laboratory and other test results, and medications completed Mailed printed educational materials related to Diabetes management            Our next appointment is by telephone on 11/30/22 at 09:30 AM  Please call the care guide team at [phone] if you need to cancel or reschedule your appointment.   If you are experiencing a Mental Health or [place] or need someone to talk to, please call 1-800-273-TALK (toll free, 24 hour hotline) go to [facility] [address], [place] [phone])  Patient verbalizes understanding of instructions and care plan provided today and agrees to view in [place]. Active MyChart status and patient understanding of how to access instructions and care plan via MyChart confirmed with patient.     Barb Merino, RN, BSN, CCM Care Management Coordinator [facility] Care Management Direct Phone: [phone]

## 2022-08-23 ENCOUNTER — Encounter: Payer: Self-pay | Admitting: *Deleted

## 2022-08-23 NOTE — Progress Notes (Signed)
[facility] PREP Weekly Session  Patient Details  Name: Andres [name] MRN: [id number] Date of Birth: 04/04/1951 Age: 72 y.o. PCP: Glendale Chard, MD  Vitals:   08/23/22 1400  Weight: 229 lb 9.6 oz (104.1 kg)     [facility] Weekly seesion - 08/23/22 1600       [facility] "PREP" Location   [facility] "PREP" Location [facility]      Weekly Session   Topic Discussed Hitting roadblocks   Review major course topics. Discuss barriers and how to stay on track. Assign "Goals and Activity Sheet" for end of program final assessment meeting.   Minutes exercised this week 75 minutes    Classes attended to date [place], [place] 08/23/2022, 8:46 PM

## 2022-08-30 ENCOUNTER — Encounter: Payer: Self-pay | Admitting: *Deleted

## 2022-08-30 NOTE — Progress Notes (Signed)
[facility] PREP Weekly Session  Patient Details  Name: Andres [name] MRN: [id number] Date of Birth: [birth year] Age: 72 y.o. PCP: Glendale Chard, MD  Vitals:   08/30/22 1400  Weight: 228 lb (103.4 kg)     [facility] Weekly seesion - 08/30/22 1600       [facility] "PREP" Location   [facility] "PREP" Location [place]      Weekly Session   Topic Discussed Other   Fit and Strong survey, Final FIT Testing   Minutes exercised this week 75 minutes    Classes attended to date [place], [place] 08/30/2022, 9:31 PM

## 2022-09-02 ENCOUNTER — Encounter: Payer: Self-pay | Admitting: *Deleted

## 2022-09-02 NOTE — Progress Notes (Signed)
[facility] PREP Evaluation  Patient Details  Name: Andres [name] MRN: [id number] Date of Birth: Dec 20, 1950 Age: 72 y.o. PCP: Andres Chard, MD  Vitals:   09/01/22 1400  BP: 126/86  Pulse: (!) 104  Resp: 20  SpO2: 98%  Weight: 225 lb 12.8 oz (102.4 kg)  Height: 6' (1.829 m)     [facility] Eval - 09/01/22 1400       [facility] "PREP" Location   [facility] "PREP" Location [place]      Referral    Referring Provider Andres [name]    Reason for referral High Cholesterol;Hypertension;Obesitity/Overweight;Diabetes    Program Start Date 06/07/22    Program End Date 09/01/22      Measurement   Waist Circumference 47.25 inches    Waist Circumference End Program 44 inches    Hip Circumference 45 inches    Hip Circumference End Program 44 inches    Body fat 34.5 percent      Mobility and Daily Activities   I find it easy to walk up or down two or more flights of stairs. 3    I have no trouble taking out the trash. 4    I do housework such as vacuuming and dusting on my own without difficulty. 3    I can easily lift a gallon of milk (8lbs). 4    I can easily walk a mile. 4    I have no trouble reaching into high cupboards or reaching down to pick up something from the floor. 2    I do not have trouble doing out-door work such as Armed forces logistics/support/administrative officer, raking leaves, or gardening. 4      Mobility and Daily Activities   I feel younger than my age. 3    I feel independent. 4    I feel energetic. 2    I live an active life.  4    I feel strong. 4    I feel healthy. 4    I feel active as other people my age. 2      How fit and strong are you.   Fit and Strong Total Score 47            Past Medical History:  Diagnosis Date   Diabetes ([place])    High cholesterol    Hypertension    states under control with med., has been on med. x 5 yr.   Seasonal allergies    Umbilical hernia [id number]   Past Surgical History:  Procedure Laterality Date   INSERTION OF MESH N/A 09/25/2014   Procedure:  INSERTION OF MESH;  Surgeon: Donnie Mesa, MD;  Location: [place];  Service: General;  Laterality: N/A;   ORIF PATELLA FRACTURE Right 06/16/2008   TOTAL HIP ARTHROPLASTY Left [id number]   UMBILICAL HERNIA REPAIR N/A 09/25/2014   Procedure: UMBILICAL HERNIA REPAIR WITH MESH;  Surgeon: Donnie Mesa, MD;  Location: [place];  Service: General;  Laterality: N/A;   Social History   Tobacco Use  Smoking Status Former   Packs/day: 0.50   Years: 22.00   Total pack years: 11.00   Types: Cigarettes   Quit date: 07/11/2011   Years since quitting: 11.1  Smokeless Tobacco Never  Tobacco Comments   he has quit    Andres [name] 09/02/2022, 7:55 AM  PREP Completed. Attended 10 of 11 education classes and 10 of 11 exercise sessions. Andres [name] had great attendance and was very engaged in class. Noted improvement in FIT  testing with sit to stand score and balance. 7lb weight loss and waist/hip inches lost 3.25"/1" in 12 weeks. Excellent work Andres [name]!

## 2022-10-04 ENCOUNTER — Other Ambulatory Visit: Payer: Self-pay | Admitting: Internal Medicine

## 2022-10-26 ENCOUNTER — Other Ambulatory Visit: Payer: Self-pay

## 2022-10-26 DIAGNOSIS — I129 Hypertensive chronic kidney disease with stage 1 through stage 4 chronic kidney disease, or unspecified chronic kidney disease: Secondary | ICD-10-CM

## 2022-10-26 MED ORDER — OLMESARTAN MEDOXOMIL-HCTZ 20-12.5 MG PO TABS: 1.0000 | ORAL_TABLET | Freq: Every day | ORAL | 2 refills | Status: DC

## 2022-11-28 ENCOUNTER — Encounter: Payer: Self-pay | Admitting: Internal Medicine

## 2022-11-28 ENCOUNTER — Ambulatory Visit (INDEPENDENT_AMBULATORY_CARE_PROVIDER_SITE_OTHER): Payer: Medicare PPO | Admitting: Internal Medicine

## 2022-11-28 VITALS — BP 128/70 | HR 80 | Temp 98.4°F | Ht 72.0 in | Wt 225.6 lb

## 2022-11-28 DIAGNOSIS — N401 Enlarged prostate with lower urinary tract symptoms: Secondary | ICD-10-CM | POA: Diagnosis not present

## 2022-11-28 DIAGNOSIS — Z0000 Encounter for general adult medical examination without abnormal findings: Secondary | ICD-10-CM

## 2022-11-28 DIAGNOSIS — I129 Hypertensive chronic kidney disease with stage 1 through stage 4 chronic kidney disease, or unspecified chronic kidney disease: Secondary | ICD-10-CM

## 2022-11-28 DIAGNOSIS — N138 Other obstructive and reflux uropathy: Secondary | ICD-10-CM | POA: Diagnosis not present

## 2022-11-28 DIAGNOSIS — E1122 Type 2 diabetes mellitus with diabetic chronic kidney disease: Secondary | ICD-10-CM | POA: Diagnosis not present

## 2022-11-28 DIAGNOSIS — E6609 Other obesity due to excess calories: Secondary | ICD-10-CM | POA: Diagnosis not present

## 2022-11-28 DIAGNOSIS — N182 Chronic kidney disease, stage 2 (mild): Secondary | ICD-10-CM | POA: Diagnosis not present

## 2022-11-28 DIAGNOSIS — Z0001 Encounter for general adult medical examination with abnormal findings: Secondary | ICD-10-CM | POA: Diagnosis not present

## 2022-11-28 DIAGNOSIS — Z6830 Body mass index (BMI) 30.0-30.9, adult: Secondary | ICD-10-CM | POA: Diagnosis not present

## 2022-11-28 DIAGNOSIS — Z794 Long term (current) use of insulin: Secondary | ICD-10-CM

## 2022-11-28 LAB — POCT URINALYSIS DIPSTICK
Bilirubin, UA: NEGATIVE
Glucose, UA: POSITIVE — AB
Ketones, UA: NEGATIVE
Leukocytes, UA: NEGATIVE
Nitrite, UA: NEGATIVE
Protein, UA: NEGATIVE
Spec Grav, UA: >=1.03 — AB (ref 1.010–1.025)
Urobilinogen, UA: 0.2 E.U./dL
pH, UA: 5.5 (ref 5.0–8.0)

## 2022-11-28 MED ORDER — FLUTICASONE PROPIONATE 0.05 % EX CREA: TOPICAL_CREAM | Freq: Two times a day (BID) | CUTANEOUS | 1 refills | Status: DC | PRN

## 2022-11-28 NOTE — Progress Notes (Signed)
Jeri Cos Llittleton,acting as a Neurosurgeon for [name], MD.,have documented all relevant documentation on the behalf of [name], MD,as directed by  [name], MD while in the presence of [name], MD.   Subjective:     Patient ID: Andres [name] , male    DOB: 26-Sep-1950 , 72 y.o.   MRN: [id number]   Chief Complaint  Patient presents with   Annual Exam   Diabetes   Hypertension    HPI  Patient here for a physical. He reports compliance with meds. He denies having any headaches, chest pain and shortness of breath. He is followed by Urology for his prostate exams.   Diabetes He presents for his follow-up diabetic visit. He has type 2 diabetes mellitus. His disease course has been stable. There are no hypoglycemic associated symptoms. Pertinent negatives for diabetes include no blurred vision. There are no hypoglycemic complications. Diabetic complications include nephropathy. Risk factors for coronary artery disease include diabetes mellitus, dyslipidemia, hypertension and male sex. He is following a diabetic diet. His breakfast blood glucose is taken between 8-9 am. His breakfast blood glucose range is generally 90-110 mg/dl. An ACE inhibitor/angiotensin II receptor blocker is being taken. Eye exam is not current.  Hypertension This is a chronic problem. The current episode started more than 1 year ago. The problem has been gradually improving since onset. The problem is controlled. Pertinent negatives include no blurred vision. Past treatments include angiotensin blockers and diuretics. The current treatment provides moderate improvement.     Past Medical History:  Diagnosis Date   Diabetes (HCC)    High cholesterol    Hypertension    states under control with med., has been on med. x 5 yr.   Seasonal allergies    Umbilical hernia 09/2014     Family History  Problem Relation Age of Onset   Healthy Mother    Early death Father      Current Outpatient  Medications:    Accu-Chek FastClix Lancets MISC, One tab po qd dx; e11.65, Disp: 100 each, Rfl: 11   ACCU-CHEK GUIDE test strip, Use to check BS qd dx: E11.65, Disp: 100 each, Rfl: 11   Arginine 500 MG CAPS, Take by mouth. Daily, Disp: , Rfl:    aspirin 81 MG tablet, Take 81 mg by mouth daily., Disp: , Rfl:    cholecalciferol (VITAMIN D) 1000 UNITS tablet, Take 4,000 Units by mouth daily., Disp: , Rfl:    Cyanocobalamin (VITAMIN B 12) 250 MCG LOZG, Take 1 tablet by mouth every other day., Disp: , Rfl:    Ferrous Sulfate (IRON PO), Take 1 capsule by mouth daily at 2 am., Disp: , Rfl:    levocetirizine (XYZAL) 5 MG tablet, Take 1 tablet (5 mg total) by mouth every evening., Disp: 90 tablet, Rfl: 2   Magnesium Oxide 420 MG TABS, Take 1 tablet by mouth daily., Disp: , Rfl:    Multiple Vitamin (MULTIVITAMIN) tablet, Take 1 tablet by mouth daily., Disp: , Rfl:    olmesartan-hydrochlorothiazide (BENICAR HCT) 20-12.5 MG tablet, Take 1 tablet by mouth daily., Disp: 90 tablet, Rfl: 2   rosuvastatin (CRESTOR) 10 MG tablet, TAKE 1 TABLET BY MOUTH EVERY MONDAY, TUESDAY, WEDNESDAY, THURS, AND FRIDAY FOR 20 DAYS, Disp: 20 tablet, Rfl: 0   tadalafil (CIALIS) 5 MG tablet, TAKE 1 TABLET BY MOUTH ONCE A DAY., Disp: 30 tablet, Rfl: 2   UNABLE TO FIND, Take 1 capsule by mouth daily at 2 am.  Med Name: testosterone, Disp: , Rfl:    fluticasone (CUTIVATE) 0.05 % cream, Apply topically 2 (two) times daily as needed., Disp: 60 g, Rfl: 1   [name] XR 50-1000 MG TB24, Take 1 tablet by mouth 2 (two) times daily., Disp: 180 tablet, Rfl: 1   No Known Allergies   Men's preventive visit. Patient Health Questionnaire (PHQ-2) is  Flowsheet Row Office Visit from 11/28/2022 in [facility]  PHQ-2 Total Score 0     . Patient is on a healthy diet. Marital status: Legally Separated. Relevant history for alcohol use is:  Social History   Substance and Sexual Activity  Alcohol Use No  . Relevant  history for tobacco use is:  Social History   Tobacco Use  Smoking Status Former   Packs/day: 0.50   Years: 22.00   Additional pack years: 0.00   Total pack years: 11.00   Types: Cigarettes   Quit date: 07/11/2011   Years since quitting: 11.3  Smokeless Tobacco Never  Tobacco Comments   he has quit  .   Review of Systems  Constitutional: Negative.   HENT: Negative.    Eyes: Negative.  Negative for blurred vision.  Respiratory: Negative.    Cardiovascular: Negative.   Gastrointestinal: Negative.   Endocrine: Negative.   Genitourinary: Negative.   Musculoskeletal: Negative.   Skin: Negative.   Neurological: Negative.   Hematological: Negative.   Psychiatric/Behavioral: Negative.       Today's Vitals   11/28/22 0936  BP: 128/70  Pulse: 80  Temp: 98.4 F (36.9 C)  TempSrc: Oral  Weight: 225 lb 9.6 oz (102.3 kg)  Height: 6' (1.829 m)  PainSc: 0-No pain   Body mass index is 30.6 kg/m.   Objective:  Physical Exam Vitals and nursing note reviewed.  Constitutional:      Appearance: Normal appearance.  HENT:     Head: Normocephalic and atraumatic.     Right Ear: Tympanic membrane, ear canal and external ear normal.     Left Ear: Tympanic membrane, ear canal and external ear normal.     Nose: Nose normal.     Mouth/Throat:     Mouth: Mucous membranes are moist.     Pharynx: Oropharynx is clear.  Eyes:     Extraocular Movements: Extraocular movements intact.     Conjunctiva/sclera: Conjunctivae normal.     Pupils: Pupils are equal, round, and reactive to light.  Cardiovascular:     Rate and Rhythm: Normal rate and regular rhythm.     Pulses: Normal pulses.          Dorsalis pedis pulses are 2+ on the right side and 2+ on the left side.     Heart sounds: Normal heart sounds.  Pulmonary:     Effort: Pulmonary effort is normal.     Breath sounds: Normal breath sounds.  Chest:  Breasts:    Right: Normal. No swelling, bleeding, inverted nipple, mass or nipple  discharge.     Left: Normal. No swelling, bleeding, inverted nipple, mass or nipple discharge.  Abdominal:     General: Abdomen is flat. Bowel sounds are normal.     Palpations: Abdomen is soft.  Genitourinary:    Comments: Deferred  Musculoskeletal:        General: Normal range of motion.     Cervical back: Normal range of motion and neck supple.  Feet:     Right foot:     Protective Sensation: 5 sites tested.  5 sites sensed.     Skin integrity: Dry skin present.     Toenail Condition: Right toenails are long.     Left foot:     Protective Sensation: 5 sites tested.  5 sites sensed.     Skin integrity: Dry skin present.     Toenail Condition: Left toenails are long.  Skin:    General: Skin is warm.  Neurological:     General: No focal deficit present.     Mental Status: He is alert.  Psychiatric:        Mood and Affect: Mood normal.        Behavior: Behavior normal.         Assessment And Plan:    1. Encounter for general adult medical examination w/o abnormal findings Comments: A full exam was performed. DRE deferred, he is followed by Urology for his prostate exams. He has upcoming appt.  PATIENT IS ADVISED TO GET 30-45 MINUTES REGULAR EXERCISE NO LESS THAN FOUR TO FIVE DAYS PER WEEK - BOTH WEIGHTBEARING EXERCISES AND AEROBIC ARE RECOMMENDED.  PATIENT IS ADVISED TO FOLLOW A HEALTHY DIET WITH AT LEAST SIX FRUITS/VEGGIES PER DAY, DECREASE INTAKE OF RED MEAT, AND TO INCREASE FISH INTAKE TO TWO DAYS PER WEEK.  MEATS/FISH SHOULD NOT BE FRIED, BAKED OR BROILED IS PREFERABLE.  IT IS ALSO IMPORTANT TO CUT BACK ON YOUR SUGAR INTAKE. PLEASE AVOID ANYTHING WITH ADDED SUGAR, CORN SYRUP OR OTHER SWEETENERS. IF YOU MUST USE A SWEETENER, YOU CAN TRY STEVIA. IT IS ALSO IMPORTANT TO AVOID ARTIFICIALLY SWEETENERS AND DIET BEVERAGES. LASTLY, I SUGGEST WEARING SPF 50 SUNSCREEN ON EXPOSED PARTS AND ESPECIALLY WHEN IN THE DIRECT SUNLIGHT FOR AN EXTENDED PERIOD OF TIME.  PLEASE AVOID FAST FOOD  RESTAURANTS AND INCREASE YOUR WATER INTAKE. - POCT Urinalysis Dipstick (81002) - Microalbumin / Creatinine Urine Ratio  2. Type 2 diabetes mellitus with stage 2 chronic kidney disease, with long-term current use of insulin (HCC) Comments: Chronic, diabetic foot exam was performed. I plan to switch him to Synjardy XR 25/1000mg  daily once he runs out of Jardiance 10mg , f/u in four weeks.  I DISCUSSED WITH THE PATIENT AT LENGTH REGARDING THE GOALS OF GLYCEMIC CONTROL AND POSSIBLE LONG-TERM COMPLICATIONS.  I  ALSO STRESSED THE IMPORTANCE OF COMPLIANCE WITH HOME GLUCOSE MONITORING, DIETARY RESTRICTIONS INCLUDING AVOIDANCE OF SUGARY DRINKS/PROCESSED FOODS,  ALONG WITH REGULAR EXERCISE.  I  ALSO STRESSED THE IMPORTANCE OF ANNUAL EYE EXAMS, SELF FOOT CARE AND COMPLIANCE WITH OFFICE VISITS.  - CBC with Differential/Platelet - CMP14+EGFR - Lipid panel - TSH - POCT Urinalysis Dipstick (81002) - Microalbumin / Creatinine Urine Ratio  3. Parenchymal renal hypertension, stage 1 through stage 4 or unspecified chronic kidney disease Comments: Chronic, well controlled. EKG performed, NSR w/o acute changes. He will c/w olmesartan/hct 20/12.5mg  daily. Advised to follow low sodium diet. F/u 4-6 months. - EKG 12-Lead - TSH  4. BPH with obstruction/lower urinary tract symptoms Comments: Chronic, I will fax a copy of labs to his urologist, Dr. Marlou Porch for review. - PSA Total (Reflex To Free)  5. Class 1 obesity due to excess calories with serious comorbidity and body mass index (BMI) of 30.0 to 30.9 in adult Comments: BMI is acceptable for his demographic.  He is encouraged to aim for at least 150 minutes of exercise/week.    Return in 4 weeks (on 12/26/2022), or f/u Synjardy XR 25/1000mg  daily, for 1 year physical, 4 month DM. Patient was given opportunity to ask questions. Patient verbalized understanding  of the plan and was able to repeat key elements of the plan. All questions were answered to their  satisfaction.    I, [name], MD, have reviewed all documentation for this visit. The documentation on 11/28/22 for the exam, diagnosis, procedures, and orders are all accurate and complete.   THE PATIENT IS ENCOURAGED TO PRACTICE SOCIAL DISTANCING DUE TO THE COVID-19 PANDEMIC.

## 2022-11-28 NOTE — Patient Instructions (Signed)
Health Maintenance, Male Adopting a healthy lifestyle and getting preventive care are important in promoting health and wellness. Ask your health care provider about: The right schedule for you to have regular tests and exams. Things you can do on your own to prevent diseases and keep yourself healthy. What should I know about diet, weight, and exercise? Eat a healthy diet  Eat a diet that includes plenty of vegetables, fruits, low-fat dairy products, and lean protein. Do not eat a lot of foods that are high in solid fats, added sugars, or sodium. Maintain a healthy weight Body mass index (BMI) is a measurement that can be used to identify possible weight problems. It estimates body fat based on height and weight. Your health care provider can help determine your BMI and help you achieve or maintain a healthy weight. Get regular exercise Get regular exercise. This is one of the most important things you can do for your health. Most adults should: Exercise for at least 150 minutes each week. The exercise should increase your heart rate and make you sweat (moderate-intensity exercise). Do strengthening exercises at least twice a week. This is in addition to the moderate-intensity exercise. Spend less time sitting. Even light physical activity can be beneficial. Watch cholesterol and blood lipids Have your blood tested for lipids and cholesterol at 70 years of age, then have this test every 5 years. You may need to have your cholesterol levels checked more often if: Your lipid or cholesterol levels are high. You are older than 70 years of age. You are at high risk for heart disease. What should I know about cancer screening? Many types of cancers can be detected early and may often be prevented. Depending on your health history and family history, you may need to have cancer screening at various ages. This may include screening for: Colorectal cancer. Prostate cancer. Skin cancer. Lung  cancer. What should I know about heart disease, diabetes, and high blood pressure? Blood pressure and heart disease High blood pressure causes heart disease and increases the risk of stroke. This is more likely to develop in people who have high blood pressure readings or are overweight. Talk with your health care provider about your target blood pressure readings. Have your blood pressure checked: Every 3-5 years if you are 18-39 years of age. Every year if you are 40 years old or older. If you are between the ages of 65 and 75 and are a current or former smoker, ask your health care provider if you should have a one-time screening for abdominal aortic aneurysm (AAA). Diabetes Have regular diabetes screenings. This checks your fasting blood sugar level. Have the screening done: Once every three years after age 45 if you are at a normal weight and have a low risk for diabetes. More often and at a younger age if you are overweight or have a high risk for diabetes. What should I know about preventing infection? Hepatitis B If you have a higher risk for hepatitis B, you should be screened for this virus. Talk with your health care provider to find out if you are at risk for hepatitis B infection. Hepatitis C Blood testing is recommended for: Everyone born from 1945 through 1965. Anyone with known risk factors for hepatitis C. Sexually transmitted infections (STIs) You should be screened each year for STIs, including gonorrhea and chlamydia, if: You are sexually active and are younger than 70 years of age. You are older than 70 years of age and your   health care provider tells you that you are at risk for this type of infection. Your sexual activity has changed since you were last screened, and you are at increased risk for chlamydia or gonorrhea. Ask your health care provider if you are at risk. Ask your health care provider about whether you are at high risk for HIV. Your health care provider  may recommend a prescription medicine to help prevent HIV infection. If you choose to take medicine to prevent HIV, you should first get tested for HIV. You should then be tested every 3 months for as long as you are taking the medicine. Follow these instructions at home: Alcohol use Do not drink alcohol if your health care provider tells you not to drink. If you drink alcohol: Limit how much you have to 0-2 drinks a day. Know how much alcohol is in your drink. In the [place] one drink equals one 12 oz bottle of beer (355 mL), one 5 oz glass of wine (148 mL), or one 1 oz glass of hard liquor (44 mL). Lifestyle Do not use any products that contain nicotine or tobacco. These products include cigarettes, chewing tobacco, and vaping devices, such as e-cigarettes. If you need help quitting, ask your health care provider. Do not use street drugs. Do not share needles. Ask your health care provider for help if you need support or information about quitting drugs. General instructions Schedule regular health, dental, and eye exams. Stay current with your vaccines. Tell your health care provider if: You often feel depressed. You have ever been abused or do not feel safe at home. Summary Adopting a healthy lifestyle and getting preventive care are important in promoting health and wellness. Follow your health care provider's instructions about healthy diet, exercising, and getting tested or screened for diseases. Follow your health care provider's instructions on monitoring your cholesterol and blood pressure. This information is not intended to replace advice given to you by your health care provider. Make sure you discuss any questions you have with your health care provider. Document Revised: 11/16/2020 Document Reviewed: 11/16/2020 Elsevier Patient Education  2023 [employer]  

## 2022-11-29 ENCOUNTER — Other Ambulatory Visit: Payer: Self-pay

## 2022-11-29 LAB — CBC WITH DIFFERENTIAL/PLATELET
Basophils Absolute: 0 10*3/uL (ref 0.0–0.2)
Basos: 1 %
EOS (ABSOLUTE): 0.4 10*3/uL (ref 0.0–0.4)
Eos: 5 %
Hematocrit: 41.1 % (ref 37.5–51.0)
Hemoglobin: 13.8 g/dL (ref 13.0–17.7)
Immature Grans (Abs): 0 10*3/uL (ref 0.0–0.1)
Immature Granulocytes: 0 %
Lymphocytes Absolute: 1.5 10*3/uL (ref 0.7–3.1)
Lymphs: 19 %
MCH: 30.1 pg (ref 26.6–33.0)
MCHC: 33.6 g/dL (ref 31.5–35.7)
MCV: 90 fL (ref 79–97)
Monocytes Absolute: 0.7 10*3/uL (ref 0.1–0.9)
Monocytes: 9 %
Neutrophils Absolute: 4.9 10*3/uL (ref 1.4–7.0)
Neutrophils: 66 %
Platelets: 239 10*3/uL (ref 150–450)
RBC: 4.58 x10E6/uL (ref 4.14–5.80)
RDW: 13.1 % (ref 11.6–15.4)
WBC: 7.5 10*3/uL (ref 3.4–10.8)

## 2022-11-29 LAB — CMP14+EGFR
ALT: 23 IU/L (ref 0–44)
AST: 20 IU/L (ref 0–40)
Albumin/Globulin Ratio: 1.4 (ref 1.2–2.2)
Albumin: 4.7 g/dL (ref 3.9–4.9)
Alkaline Phosphatase: 65 IU/L (ref 44–121)
BUN/Creatinine Ratio: 14 (ref 10–24)
BUN: 18 mg/dL (ref 8–27)
Bilirubin Total: 0.5 mg/dL (ref 0.0–1.2)
CO2: 22 mmol/L (ref 20–29)
Calcium: 9.9 mg/dL (ref 8.6–10.2)
Chloride: 100 mmol/L (ref 96–106)
Creatinine, Ser: 1.32 mg/dL — ABNORMAL HIGH (ref 0.76–1.27)
Globulin, Total: 3.3 g/dL (ref 1.5–4.5)
Glucose: 124 mg/dL — ABNORMAL HIGH (ref 70–99)
Potassium: 4.7 mmol/L (ref 3.5–5.2)
Sodium: 139 mmol/L (ref 134–144)
Total Protein: 8 g/dL (ref 6.0–8.5)
eGFR: 58 mL/min/{1.73_m2} — ABNORMAL LOW (ref >59)

## 2022-11-29 LAB — LIPID PANEL
Chol/HDL Ratio: 3.8 ratio (ref 0.0–5.0)
Cholesterol, Total: 119 mg/dL (ref 100–199)
HDL: 31 mg/dL — ABNORMAL LOW (ref >39)
LDL Chol Calc (NIH): 62 mg/dL (ref 0–99)
Triglycerides: 147 mg/dL (ref 0–149)
VLDL Cholesterol Cal: 26 mg/dL (ref 5–40)

## 2022-11-29 LAB — PSA TOTAL (REFLEX TO FREE): Prostate Specific Ag, Serum: 1.2 ng/mL (ref 0.0–4.0)

## 2022-11-29 LAB — TSH: TSH: 2.14 u[IU]/mL (ref 0.450–4.500)

## 2022-11-29 LAB — MICROALBUMIN / CREATININE URINE RATIO
Creatinine, Urine: 100.2 mg/dL
Microalb/Creat Ratio: 7 mg/g creat (ref 0–29)
Microalbumin, Urine: 7.2 ug/mL

## 2022-11-29 MED ORDER — FLUTICASONE PROPIONATE 0.05 % EX CREA: TOPICAL_CREAM | Freq: Two times a day (BID) | CUTANEOUS | 1 refills | Status: DC | PRN

## 2022-12-01 ENCOUNTER — Ambulatory Visit: Payer: Medicare HMO | Admitting: Internal Medicine

## 2022-12-01 ENCOUNTER — Ambulatory Visit (INDEPENDENT_AMBULATORY_CARE_PROVIDER_SITE_OTHER): Payer: Medicare PPO

## 2022-12-01 VITALS — Ht 72.0 in | Wt 225.0 lb

## 2022-12-01 DIAGNOSIS — Z0000 Encounter for general adult medical examination without abnormal findings: Secondary | ICD-10-CM | POA: Diagnosis not present

## 2022-12-01 NOTE — Progress Notes (Signed)
I connected with  Asad L Wohlfarth on 12/01/22 by a audio enabled telemedicine application and verified that I am speaking with the correct person using two identifiers.  Patient Location: Home  Provider Location: Office/Clinic  I discussed the limitations of evaluation and management by telemedicine. The patient expressed understanding and agreed to proceed.  Subjective:   Andres [name] is a 72 y.o. male who presents for Medicare Annual/Subsequent preventive examination.  Review of Systems     Cardiac Risk Factors include: advanced age (>16men, >39 women);hypertension;male gender;obesity (BMI >30kg/m2)     Objective:    Today's Vitals   12/01/22 1057  Weight: 225 lb (102.1 kg)  Height: 6' (1.829 m)   Body mass index is 30.52 kg/m.     12/01/2022   11:01 AM 11/25/2021   12:20 PM 11/12/2020   10:30 AM 11/07/2019   10:36 AM 10/30/2018   10:10 AM 09/25/2014    6:30 AM 09/19/2014   10:51 AM  Advanced Directives  Does Patient Have a Medical Advance Directive? No No Yes Yes No Yes Yes  Type of Advance Directive   Living will Living will  Healthcare Power of [place];Living will Living will;Healthcare Power of Attorney  Does patient want to make changes to medical advance directive?      No - Patient declined No - Patient declined  Copy of Healthcare Power of Attorney in Chart?      No - copy requested   Would patient like information on creating a medical advance directive?     Yes (MAU/Ambulatory/Procedural Areas - Information given)      Current Medications (verified) Outpatient Encounter Medications as of 12/01/2022  Medication Sig   Accu-Chek FastClix Lancets MISC One tab po qd dx; e11.65   ACCU-CHEK GUIDE test strip Use to check BS qd dx: E11.65   Arginine 500 MG CAPS Take by mouth. Daily   aspirin 81 MG tablet Take 81 mg by mouth daily.   cholecalciferol (VITAMIN D) 1000 UNITS tablet Take 4,000 Units by mouth daily.   Cyanocobalamin (VITAMIN B 12) 250 MCG LOZG Take 1  tablet by mouth every other day.   Ferrous Sulfate (IRON PO) Take 1 capsule by mouth daily at 2 am.   fluticasone (CUTIVATE) 0.05 % cream Apply topically 2 (two) times daily as needed.   levocetirizine (XYZAL) 5 MG tablet Take 1 tablet (5 mg total) by mouth every evening.   Magnesium Oxide 420 MG TABS Take 1 tablet by mouth daily.   Multiple Vitamin (MULTIVITAMIN) tablet Take 1 tablet by mouth daily.   olmesartan-hydrochlorothiazide (BENICAR HCT) 20-12.5 MG tablet Take 1 tablet by mouth daily.   rosuvastatin (CRESTOR) 10 MG tablet TAKE 1 TABLET BY MOUTH EVERY MONDAY, TUESDAY, WEDNESDAY, THURS, AND FRIDAY FOR 20 DAYS   tadalafil (CIALIS) 5 MG tablet TAKE 1 TABLET BY MOUTH ONCE A DAY.   UNABLE TO FIND Take 1 capsule by mouth daily at 2 am. Med Name: testosterone   [name] XR 50-1000 MG TB24 Take 1 tablet by mouth 2 (two) times daily.   [DISCONTINUED] fluticasone (CUTIVATE) 0.05 % cream Apply topically 2 (two) times daily as needed.   No facility-administered encounter medications on file as of 12/01/2022.    Allergies (verified) Patient has no known allergies.   History: Past Medical History:  Diagnosis Date   Diabetes (HCC)    High cholesterol    Hypertension    states under control with med., has been on med. x 5 yr.   Seasonal  allergies    Umbilical hernia 09/2014   Past Surgical History:  Procedure Laterality Date   INSERTION OF MESH N/A 09/25/2014   Procedure: INSERTION OF MESH;  Surgeon: Manus Rudd, MD;  Location: [facility];  Service: General;  Laterality: N/A;   ORIF PATELLA FRACTURE Right 06/16/2008   TOTAL HIP ARTHROPLASTY Left 12/17/2007   UMBILICAL HERNIA REPAIR N/A 09/25/2014   Procedure: UMBILICAL HERNIA REPAIR WITH MESH;  Surgeon: Manus Rudd, MD;  Location:  SURGERY CENTER;  Service: General;  Laterality: N/A;   Family History  Problem Relation Age of Onset   Healthy Mother    Early death Father    Social History   Socioeconomic  History   Marital status: Legally Separated    Spouse name: Not on file   Number of children: Not on file   Years of education: Not on file   Highest education level: Not on file  Occupational History   Occupation: retired  Tobacco Use   Smoking status: Former    Packs/day: 0.50    Years: 22.00    Additional pack years: 0.00    Total pack years: 11.00    Types: Cigarettes    Quit date: 07/11/2011    Years since quitting: 11.4   Smokeless tobacco: Never   Tobacco comments:    he has quit  Vaping Use   Vaping Use: Never used  Substance and Sexual Activity   Alcohol use: No   Drug use: No   Sexual activity: Yes  Other Topics Concern   Not on file  Social History Narrative   Not on file   Social Determinants of Health   Financial Resource Strain: Low Risk  (12/01/2022)   Overall Financial Resource Strain (CARDIA)    Difficulty of Paying Living Expenses: Not hard at all  Food Insecurity: No Food Insecurity (12/01/2022)   Hunger Vital Sign    Worried About Running Out of Food in the Last Year: Never true    Ran Out of Food in the Last Year: Never true  Transportation Needs: No Transportation Needs (12/01/2022)   PRAPARE - Administrator, Civil Service (Medical): No    Lack of Transportation (Non-Medical): No  Physical Activity: Insufficiently Active (12/01/2022)   Exercise Vital Sign    Days of Exercise per Week: 2 days    Minutes of Exercise per Session: 60 min  Stress: No Stress Concern Present (12/01/2022)   [employer] of Occupational Health - Occupational Stress Questionnaire    Feeling of Stress : Not at all  Social Connections: Not on file    Tobacco Counseling Counseling given: Not Answered Tobacco comments: he has quit   Clinical Intake:  Pre-visit preparation completed: Yes  Pain : No/denies pain     Nutritional Status: BMI > 30  Obese Nutritional Risks: None Diabetes: Yes  How often do you need to have someone help you when you  read instructions, pamphlets, or other written materials from your doctor or pharmacy?: 1 - Never  Diabetic? Yes Nutrition Risk Assessment:  Has the patient had any N/V/D within the last 2 months?  No  Does the patient have any non-healing wounds?  No  Has the patient had any unintentional weight loss or weight gain?  No   Diabetes:  Is the patient diabetic?  Yes  If diabetic, was a CBG obtained today?  No  Did the patient bring in their glucometer from home?  No  How often do you  monitor your CBG's? Twice daily.   Financial Strains and Diabetes Management:  Are you having any financial strains with the device, your supplies or your medication? No .  Does the patient want to be seen by Chronic Care Management for management of their diabetes?  No  Would the patient like to be referred to a Nutritionist or for Diabetic Management?  No   Diabetic Exams:  Diabetic Eye Exam: Completed 06/29/2022 Diabetic Foot Exam: Completed 11/28/2022   Interpreter Needed?: No  Information entered by :: NAllen LPN   Activities of Daily Living    12/01/2022   11:02 AM  In your present state of health, do you have any difficulty performing the following activities:  Hearing? 0  Vision? 0  Difficulty concentrating or making decisions? 0  Walking or climbing stairs? 0  Dressing or bathing? 0  Doing errands, shopping? 0  Preparing Food and eating ? N  Using the Toilet? N  In the past six months, have you accidently leaked urine? N  Do you have problems with loss of bowel control? N  Managing your Medications? N  Managing your Finances? N  Housekeeping or managing your Housekeeping? N    Patient Care Team: Dorothyann Peng, MD as PCP - General (Internal Medicine) Wyline Mood Dorothe Pea, MD as PCP - Cardiology (Cardiology) Clarene Duke Karma Lew, RN as Case Manager Harlan Stains, [facility] (Pharmacist)  Indicate any recent Medical Services you may have received from other than [facility] providers in the  past year (date may be approximate).     Assessment:   This is a routine wellness examination for Andres [name].  Hearing/Vision screen Vision Screening - Comments:: Regular eye exams, [facility]  Dietary issues and exercise activities discussed: Current Exercise Habits: Home exercise routine, Type of exercise: calisthenics;stretching, Time (Minutes): 60, Frequency (Times/Week): 2, Weekly Exercise (Minutes/Week): 120   Goals Addressed             This Visit's Progress    Patient Stated       12/01/2022, no goals       Depression Screen    12/01/2022   11:02 AM 11/28/2022    9:55 AM 08/01/2022   10:39 AM 11/25/2021   12:21 PM 11/15/2021    9:53 AM 11/12/2020   10:31 AM 11/09/2020   11:13 AM  PHQ 2/9 Scores  PHQ - 2 Score 0 0 0 0 0 0 0  PHQ- 9 Score  0         Fall Risk    12/01/2022   11:02 AM 11/28/2022    9:54 AM 08/01/2022   10:39 AM 11/25/2021   12:21 PM 11/15/2021    9:53 AM  Fall Risk   Falls in the past year? 0 0 0 0 0  Number falls in past yr: 0 0 0 0 0  Injury with Fall? 0 0 0 0 0  Risk for fall due to : Medication side effect No Fall Risks No Fall Risks Medication side effect No Fall Risks  Follow up Falls prevention discussed;Education provided;Falls evaluation completed Falls evaluation completed Falls evaluation completed Falls evaluation completed;Education provided;Falls prevention discussed Falls evaluation completed    FALL RISK PREVENTION PERTAINING TO THE HOME:  Any stairs in or around the home? No  If so, are there any without handrails? N/a Home free of loose throw rugs in walkways, pet beds, electrical cords, etc? Yes  Adequate lighting in your home to reduce risk of falls? Yes   ASSISTIVE DEVICES  UTILIZED TO PREVENT FALLS:  Life alert? No  Use of a cane, walker or w/c? No  Grab bars in the bathroom? No  Shower chair or bench in shower? No  Elevated toilet seat or a handicapped toilet? Yes   TIMED UP AND GO:  Was the test performed? No .       Cognitive Function:        12/01/2022   11:03 AM 11/25/2021   12:22 PM 11/12/2020   10:32 AM 11/07/2019   10:38 AM 10/30/2018    9:35 AM  6CIT Screen  What Year? 0 points 0 points 0 points 0 points 0 points  What month? 0 points 0 points 0 points 0 points 0 points  What time? 3 points 0 points 0 points 0 points 0 points  Count back from 20 2 points 0 points 0 points 0 points 0 points  Months in reverse 0 points 0 points 0 points 0 points 0 points  Repeat phrase 2 points 2 points 0 points 2 points 2 points  Total Score 7 points 2 points 0 points 2 points 2 points    Immunizations Immunization History  Administered Date(s) Administered   Fluad Quad(high Dose 65+) 05/08/2020, 03/22/2021, 03/30/2022   Influenza, High Dose Seasonal PF 04/11/2018, 04/01/2019   Moderna Covid-19 Vaccine Bivalent Booster 65yrs & up 09/07/2021   Moderna SARS-COV2 Booster Vaccination 05/22/2020   Moderna Sars-Covid-2 Vaccination 08/17/2019, 09/17/2019   PNEUMOCOCCAL CONJUGATE-20 08/02/2021   Pneumococcal Polysaccharide-23 07/23/2018   Tdap 04/17/2019   Zoster Recombinat (Shingrix) 04/19/2021, 08/30/2021    TDAP status: Up to date  Flu Vaccine status: Up to date  Pneumococcal vaccine status: Up to date  Covid-19 vaccine status: Completed vaccines  Qualifies for Shingles Vaccine? Yes   Zostavax completed Yes   Shingrix Completed?: Yes  Screening Tests Health Maintenance  Topic Date Due   COVID-19 Vaccine (4 - 2023-24 season) 03/11/2022   Medicare Annual Wellness (AWV)  11/26/2022   HEMOGLOBIN A1C  01/30/2023   INFLUENZA VACCINE  02/09/2023   OPHTHALMOLOGY EXAM  06/30/2023   Diabetic kidney evaluation - eGFR measurement  11/28/2023   Diabetic kidney evaluation - Urine ACR  11/28/2023   FOOT EXAM  11/28/2023   DTaP/Tdap/Td (2 - Td or Tdap) 04/16/2029   COLONOSCOPY (Pts 45-72yrs Insurance coverage will need to be confirmed)  01/21/2032   Pneumonia Vaccine 62+ Years old  Completed    Hepatitis C Screening  Completed   Zoster Vaccines- Shingrix  Completed   HPV VACCINES  Aged Out    Health Maintenance  Health Maintenance Due  Topic Date Due   COVID-19 Vaccine (4 - 2023-24 season) 03/11/2022   Medicare Annual Wellness (AWV)  11/26/2022    Colorectal cancer screening: Type of screening: Colonoscopy. Completed 01/21/2019. Repeat every 10 years  Lung Cancer Screening: (Low Dose CT Chest recommended if Age 40-80 years, 30 pack-year currently smoking OR have quit w/in 15years.) does not qualify.   Lung Cancer Screening Referral: no  Additional Screening:  /Hepatitis C Screening: does qualify; Completed 5  Vision Screening: Recommended annual ophthalmology exams for early detection of glaucoma and other disorders of the eye. Is the patient up to date with their annual eye exam?  Yes  Who is the provider or what is the name of the office in which the patient attends annual eye exams? [facility] If pt is not established with a provider, would they like to be referred to a provider to establish care? No .  Dental Screening: Recommended annual dental exams for proper oral hygiene  Community Resource Referral / Chronic Care Management: CRR required this visit?  No   CCM required this visit?  No      Plan:     I have personally reviewed and noted the following in the patient's chart:   Medical and social history Use of alcohol, tobacco or illicit drugs  Current medications and supplements including opioid prescriptions. Patient is not currently taking opioid prescriptions. Functional ability and status Nutritional status Physical activity Advanced directives List of other physicians Hospitalizations, surgeries, and ER visits in previous 12 months Vitals Screenings to include cognitive, depression, and falls Referrals and appointments  In addition, I have reviewed and discussed with patient certain preventive protocols, quality metrics, and best  practice recommendations. A written personalized care plan for preventive services as well as general preventive health recommendations were provided to patient.     Barb Merino, LPN   [id number]/61/[id number]   Nurse Notes: none  Due to this being a virtual visit, the after visit summary with patients personalized plan was offered to patient via mail or my-chart.  Patient would like to access on my-chart

## 2022-12-01 NOTE — Patient Instructions (Signed)
Andres [name] , Thank you for taking time to come for your Medicare Wellness Visit. I appreciate your ongoing commitment to your health goals. Please review the following plan we discussed and let me know if I can assist you in the future.   These are the goals we discussed:  Goals       Exercise 150 min/wk Moderate Activity      Manage My Medicine      Timeframe:  Long-Range Goal Priority:  High Start Date:                             Expected End Date:                       Follow Up Date 07/06/2022 In Progress:  - call for medicine refill 2 or 3 days before it runs out - call if I am sick and can't take my medicine - keep a list of all the medicines I take; vitamins and herbals too - learn to read medicine labels - use an alarm clock or phone to remind me to take my medicine    Why is this important?   These steps will help you keep on track with your medicines.  Notes: Please bring the following to our next appointment for patient assistance: your financial information, new insurance card (if applicable)        Patient Stated      11/07/2019, exercise more      Patient Stated      11/12/2020, stay in shape      Patient Stated      11/25/2021, no goals      Patient Stated      12/01/2022, no goals      To lower A1c<6.5 % (pt-stated)      Care Coordination Interventions: Provided education to patient about basic DM disease process Advised patient, providing education and rationale, to check cbg daily before breakfast and at bedtime and record, calling PCP for findings outside established parameters Review of patient status, including review of consultants reports, relevant laboratory and other test results, and medications completed Mailed printed educational materials related to Diabetes management            This is a list of the screening recommended for you and due dates:  Health Maintenance  Topic Date Due   COVID-19 Vaccine (4 - 2023-24 season) 03/11/2022    Hemoglobin A1C  01/30/2023   Flu Shot  02/09/2023   Eye exam for diabetics  06/30/2023   Yearly kidney function blood test for diabetes  11/28/2023   Yearly kidney health urinalysis for diabetes  11/28/2023   Complete foot exam   11/28/2023   Medicare Annual Wellness Visit  12/01/2023   DTaP/Tdap/Td vaccine (2 - Td or Tdap) 04/16/2029   Colon Cancer Screening  01/21/2032   Pneumonia Vaccine  Completed   Hepatitis C Screening: USPSTF Recommendation to screen - Ages 35-79 yo.  Completed   Zoster (Shingles) Vaccine  Completed   HPV Vaccine  Aged Out    Advanced directives: Advance directive discussed with you today.   Conditions/risks identified: none  Next appointment: Follow up in one year for your annual wellness visit.   Preventive Care 59 Years and Older, Male  Preventive care refers to lifestyle choices and visits with your health care provider that can promote health and wellness. What does preventive care include? A yearly  physical exam. This is also called an annual well check. Dental exams once or twice a year. Routine eye exams. Ask your health care provider how often you should have your eyes checked. Personal lifestyle choices, including: Daily care of your teeth and gums. Regular physical activity. Eating a healthy diet. Avoiding tobacco and drug use. Limiting alcohol use. Practicing safe sex. Taking low doses of aspirin every day. Taking vitamin and mineral supplements as recommended by your health care provider. What happens during an annual well check? The services and screenings done by your health care provider during your annual well check will depend on your age, overall health, lifestyle risk factors, and family history of disease. Counseling  Your health care provider may ask you questions about your: Alcohol use. Tobacco use. Drug use. Emotional well-being. Home and relationship well-being. Sexual activity. Eating habits. History of falls. Memory  and ability to understand (cognition). Work and work Astronomer. Screening  You may have the following tests or measurements: Height, weight, and BMI. Blood pressure. Lipid and cholesterol levels. These may be checked every 5 years, or more frequently if you are over 68 years old. Skin check. Lung cancer screening. You may have this screening every year starting at age 3 if you have a 30-pack-year history of smoking and currently smoke or have quit within the past 15 years. Fecal occult blood test (FOBT) of the stool. You may have this test every year starting at age 91. Flexible sigmoidoscopy or colonoscopy. You may have a sigmoidoscopy every 5 years or a colonoscopy every 10 years starting at age 62. Prostate cancer screening. Recommendations will vary depending on your family history and other risks. Hepatitis C blood test. Hepatitis B blood test. Sexually transmitted disease (STD) testing. Diabetes screening. This is done by checking your blood sugar (glucose) after you have not eaten for a while (fasting). You may have this done every 1-3 years. Abdominal aortic aneurysm (AAA) screening. You may need this if you are a current or former smoker. Osteoporosis. You may be screened starting at age 28 if you are at high risk. Talk with your health care provider about your test results, treatment options, and if necessary, the need for more tests. Vaccines  Your health care provider may recommend certain vaccines, such as: Influenza vaccine. This is recommended every year. Tetanus, diphtheria, and acellular pertussis (Tdap, Td) vaccine. You may need a Td booster every 10 years. Zoster vaccine. You may need this after age 70. Pneumococcal 13-valent conjugate (PCV13) vaccine. One dose is recommended after age 60. Pneumococcal polysaccharide (PPSV23) vaccine. One dose is recommended after age 39. Talk to your health care provider about which screenings and vaccines you need and how often you  need them. This information is not intended to replace advice given to you by your health care provider. Make sure you discuss any questions you have with your health care provider. Document Released: 07/24/2015 Document Revised: 03/16/2016 Document Reviewed: 04/28/2015 Elsevier Interactive Patient Education  2017 [employer].  Fall Prevention in the Home Falls can cause injuries. They can happen to people of all ages. There are many things you can do to make your home safe and to help prevent falls. What can I do on the outside of my home? Regularly fix the edges of walkways and driveways and fix any cracks. Remove anything that might make you trip as you walk through a door, such as a raised step or threshold. Trim any bushes or trees on the path to  your home. Use bright outdoor lighting. Clear any walking paths of anything that might make someone trip, such as rocks or tools. Regularly check to see if handrails are loose or broken. Make sure that both sides of any steps have handrails. Any raised decks and porches should have guardrails on the edges. Have any leaves, snow, or ice cleared regularly. Use sand or salt on walking paths during winter. Clean up any spills in your garage right away. This includes oil or grease spills. What can I do in the bathroom? Use night lights. Install grab bars by the toilet and in the tub and shower. Do not use towel bars as grab bars. Use non-skid mats or decals in the tub or shower. If you need to sit down in the shower, use a plastic, non-slip stool. Keep the floor dry. Clean up any water that spills on the floor as soon as it happens. Remove soap buildup in the tub or shower regularly. Attach bath mats securely with double-sided non-slip rug tape. Do not have throw rugs and other things on the floor that can make you trip. What can I do in the bedroom? Use night lights. Make sure that you have a light by your bed that is easy to reach. Do not  use any sheets or blankets that are too big for your bed. They should not hang down onto the floor. Have a firm chair that has side arms. You can use this for support while you get dressed. Do not have throw rugs and other things on the floor that can make you trip. What can I do in the kitchen? Clean up any spills right away. Avoid walking on wet floors. Keep items that you use a lot in easy-to-reach places. If you need to reach something above you, use a strong step stool that has a grab bar. Keep electrical cords out of the way. Do not use floor polish or wax that makes floors slippery. If you must use wax, use non-skid floor wax. Do not have throw rugs and other things on the floor that can make you trip. What can I do with my stairs? Do not leave any items on the stairs. Make sure that there are handrails on both sides of the stairs and use them. Fix handrails that are broken or loose. Make sure that handrails are as long as the stairways. Check any carpeting to make sure that it is firmly attached to the stairs. Fix any carpet that is loose or worn. Avoid having throw rugs at the top or bottom of the stairs. If you do have throw rugs, attach them to the floor with carpet tape. Make sure that you have a light switch at the top of the stairs and the bottom of the stairs. If you do not have them, ask someone to add them for you. What else can I do to help prevent falls? Wear shoes that: Do not have high heels. Have rubber bottoms. Are comfortable and fit you well. Are closed at the toe. Do not wear sandals. If you use a stepladder: Make sure that it is fully opened. Do not climb a closed stepladder. Make sure that both sides of the stepladder are locked into place. Ask someone to hold it for you, if possible. Clearly mark and make sure that you can see: Any grab bars or handrails. First and last steps. Where the edge of each step is. Use tools that help you move around (mobility  aids) if  they are needed. These include: Canes. Walkers. Scooters. Crutches. Turn on the lights when you go into a dark area. Replace any light bulbs as soon as they burn out. Set up your furniture so you have a clear path. Avoid moving your furniture around. If any of your floors are uneven, fix them. If there are any pets around you, be aware of where they are. Review your medicines with your doctor. Some medicines can make you feel dizzy. This can increase your chance of falling. Ask your doctor what other things that you can do to help prevent falls. This information is not intended to replace advice given to you by your health care provider. Make sure you discuss any questions you have with your health care provider. Document Released: 04/23/2009 Document Revised: 12/03/2015 Document Reviewed: 08/01/2014 Elsevier Interactive Patient Education  2017 [employer].

## 2022-12-02 ENCOUNTER — Telehealth: Payer: Self-pay

## 2022-12-02 NOTE — Progress Notes (Signed)
Care Management & Coordination Services Pharmacy Team  Reason for Encounter: Appointment Reminder  Contacted patient to confirm telephone appointment with Cherylin Mylar, PharmD on 12-07-2022 at 11:00. Unsuccessful outreach. Left voicemail for patient to return call.   Chart review: Recent office visits:  12-01-2022 Barb Merino, LPN. Medicare wellness visit.  11-28-2022 Dorothyann Peng, MD. Visit for annual exam, DM and HTN. EKG completed.  08-17-2022 Little, Karma Lew, RN [facility]).  08-01-2022 Dorothyann Peng, MD. Follow up for DM and HTN. No changes  06-14-2022 Little, Karma Lew, RN [facility]).  Recent consult visits:  09-02-2022 Remo Lipps, RN [facility]). Final PREP class  08-30-2022 Remo Lipps, RN [facility]). PREP class  08-23-2022 Remo Lipps, RN ([facility]). PREP class  08-16-2022 Remo Lipps, RN ([facility]). PREP class  08-09-2022 Remo Lipps, RN ([facility]). PREP class  08-02-2022 Remo Lipps, RN ([facility]). PREP class  08-01-2022 Remo Lipps, RN ([facility]). PREP class  07-26-2022 Remo Lipps, RN ([facility]). PREP class  07-12-2022 Remo Lipps, RN ([facility]). PREP class  06-29-2022 Daisy Lazar ([facility]). Unable to view encounter  06-28-2022 Remo Lipps, RN [facility]). PREP class  06-22-2022 Remo Lipps, RN ([facility]). PREP class  06-07-2022 Remo Lipps, RN ([facility]). PREP class  Hospital visits:  None in previous 6 months  Star Rating Drugs:  Olme/HCTZ 20-12.5 mg- Last filled 10-26-2022 90 DS [facility]. Previous 07-27-2022 90 DS Rosuvastatin 10 mg- Last filled 11-08-2022 30 DS [facility]. Previous 10-04-2022 30 DS. [name] 50-1000 mg- PAP  Care Gaps: Annual wellness visit in last year? Yes Covid booster overdue  If Diabetic: Last eye exam / retinopathy screening: 06-29-2022 Last diabetic foot exam: None noted   Huey Romans [facility] Clinical Pharmacist Assistant [phone]

## 2022-12-07 ENCOUNTER — Ambulatory Visit: Payer: Medicare PPO

## 2022-12-07 NOTE — Progress Notes (Signed)
Care Management & Coordination Services Pharmacy Note  12/07/2022 Name:  Andres [name] MRN:  [id number] DOB:  [birth year]  Summary: Patient reports that he has been doing okay but he is waiting for his lab results.   Recommendations/Changes made from today's visit: Recommend patient is started on Synjardy XR 04-999 mg tablet daily    Follow up plan: He is going to drink at least 4 bottles of water per day regardless of exercising He is going to eat low carb fruit for dessert  Patient assistance application completed by myself and  MD and patient to sign  [facility] to call patient on  Monday, and review how patient is doing with new medication   Subjective: Andres [name] is an 72 y.o. year old male who is a primary patient of Dorothyann Peng, MD.  The care coordination team was consulted for assistance with disease management and care coordination needs.    Engaged with patient by telephone for follow up visit.  Recent office visits:  12-01-2022 Barb Merino, LPN. Medicare wellness visit.   11-28-2022 Dorothyann Peng, MD. Visit for annual exam, DM and HTN. EKG completed.   08-17-2022 Little, Karma Lew, RN [facility]).   08-01-2022 Dorothyann Peng, MD. Follow up for DM and HTN. No changes   06-14-2022 Little, Karma Lew, RN [facility]).   Recent consult visits:  09-02-2022 Remo Lipps, RN [facility]). Final PREP class   08-30-2022 Remo Lipps, RN [facility]). PREP class   08-23-2022 Remo Lipps, RN ([facility]). PREP class   08-16-2022 Remo Lipps, RN ([facility]). PREP class   08-09-2022 Remo Lipps, RN ([facility]). PREP class   08-02-2022 Remo Lipps, RN ([facility]). PREP class   08-01-2022 Remo Lipps, RN ([facility]). PREP class   07-26-2022 Remo Lipps, RN ([facility]). PREP class   07-12-2022 Remo Lipps, RN ([facility]). PREP class   06-29-2022 Daisy Lazar (Optometry). Unable to view encounter   06-28-2022 Remo Lipps, RN [facility]). PREP class   06-22-2022 Remo Lipps, RN ([facility]).  PREP class   06-07-2022 Remo Lipps, RN ([facility]). PREP class   Hospital visits:  None in previous 6 months   Objective:  Lab Results  Component Value Date   CREATININE 1.32 (H) 11/28/2022   BUN 18 11/28/2022   EGFR 58 (L) 11/28/2022   GFRNONAA 62 06/29/2020   GFRAA 71 06/29/2020   NA 139 11/28/2022   K 4.7 11/28/2022   CALCIUM 9.9 11/28/2022   CO2 22 11/28/2022   GLUCOSE 124 (H) 11/28/2022    Lab Results  Component Value Date/Time   HGBA1C 6.9 (H) 08/01/2022 11:35 AM   HGBA1C 7.1 (H) 03/30/2022 12:45 PM   HGBA1C 7.7 02/13/2018 12:00 AM   MICROALBUR 30 11/07/2019 11:30 AM   MICROALBUR 10 10/30/2018 10:29 AM    Last diabetic Eye exam:  Lab Results  Component Value Date/Time   HMDIABEYEEXA No Retinopathy 06/29/2022 12:00 AM    Last diabetic Foot exam: No results found for: "HMDIABFOOTEX"   Lab Results  Component Value Date   CHOL 119 11/28/2022   HDL 31 (L) 11/28/2022   LDLCALC 62 11/28/2022   TRIG 147 11/28/2022   CHOLHDL 3.8 11/28/2022       Latest Ref Rng & Units 11/28/2022   10:36 AM 08/01/2022   11:35 AM 11/15/2021   11:05 AM  Hepatic Function  Total Protein 6.0 - 8.5 g/dL 8.0  7.5  7.3   Albumin 3.9 - 4.9 g/dL 4.7  4.5  4.4   AST 0 - 40 IU/L 20  23  25   ALT 0 - 44 IU/L 23  29  28    Alk Phosphatase 44 - 121 IU/L 65  61  58   Total Bilirubin 0.0 - 1.2 mg/dL 0.5  0.3  0.5     Lab Results  Component Value Date/Time   TSH 2.140 11/28/2022 10:36 AM   TSH 2.260 11/09/2020 12:22 PM       Latest Ref Rng & Units 11/28/2022   10:36 AM 03/30/2022   12:45 PM 11/15/2021   11:05 AM  CBC  WBC 3.4 - 10.8 x10E3/uL 7.5  9.0  6.4   Hemoglobin 13.0 - 17.7 g/dL 11.9  14.7  82.9   Hematocrit 37.5 - 51.0 % 41.1  41.6  36.8   Platelets 150 - 450 x10E3/uL 239  234  227     Lab Results  Component Value Date/Time   VITAMINB12 311 11/09/2020 12:22 PM    Clinical ASCVD: No  The ASCVD Risk score (Arnett DK, et al., 2019) failed to calculate for the following  reasons:   The systolic blood pressure is missing   The valid total cholesterol range is 130 to 320 mg/dL        5/62/1308   65:78 AM 11/28/2022    9:55 AM 08/01/2022   10:39 AM  Depression screen PHQ 2/9  Decreased Interest 0 0 0  Down, Depressed, Hopeless 0 0 0  PHQ - 2 Score 0 0 0  Altered sleeping  0   Tired, decreased energy  0   Change in appetite  0   Feeling bad or failure about yourself   0   Trouble concentrating  0   Moving slowly or fidgety/restless  0   Suicidal thoughts  0   PHQ-9 Score  0   Difficult doing work/chores  Not difficult at all      Social History   Tobacco Use  Smoking Status Former   Packs/day: 0.50   Years: 22.00   Additional pack years: 0.00   Total pack years: 11.00   Types: Cigarettes   Quit date: 07/11/2011   Years since quitting: 11.4  Smokeless Tobacco Never  Tobacco Comments   he has quit   BP Readings from Last 3 Encounters:  11/28/22 128/70  09/01/22 126/86  08/01/22 136/80   Pulse Readings from Last 3 Encounters:  11/28/22 80  09/01/22 (!) 104  08/01/22 76   Wt Readings from Last 3 Encounters:  12/01/22 225 lb (102.1 kg)  11/28/22 225 lb 9.6 oz (102.3 kg)  09/01/22 225 lb 12.8 oz (102.4 kg)   BMI Readings from Last 3 Encounters:  12/01/22 30.52 kg/m  11/28/22 30.60 kg/m  09/01/22 30.62 kg/m    No Known Allergies  Medications Reviewed Today     Reviewed by Barb Merino, LPN (Licensed Practical Nurse) on 12/01/22 at 1058  Med List Status: <None>   Medication Order Taking? Sig Documenting Provider Last Dose Status Informant  Accu-Chek FastClix Lancets MISC [id number] Yes One tab po qd dx; e11.65 Dorothyann Peng, MD Taking Active   ACCU-CHEK GUIDE test strip [id number] Yes Use to check BS qd dx: E11.65 Dorothyann Peng, MD Taking Active   Arginine 500 MG CAPS [id number] Yes Take by mouth. Daily [provider] Taking Active Self  aspirin 81 MG tablet [id number] Yes Take 81 mg by mouth daily. [provider] Taking Active   cholecalciferol (VITAMIN D) 1000 UNITS tablet [id number] Yes Take 4,000 Units by mouth daily. [provider] Taking Active   Cyanocobalamin (VITAMIN B 12) 250 MCG LOZG [id number] Yes Take 1 tablet by mouth every other day. [provider] Taking Active   Ferrous Sulfate (IRON PO) [id number] Yes Take 1 capsule by mouth daily at 2 am. [provider] Taking Active   fluticasone (CUTIVATE) 0.05 % cream [id number] Yes Apply topically 2 (two) times daily as needed. Dorothyann Peng, MD Taking Active   [name] XR 50-1000 MG TB24 [id number]  Take 1 tablet by mouth 2 (two) times daily. Dorothyann Peng, MD  Expired 08/01/22 2359   levocetirizine (XYZAL) 5 MG tablet [id number] Yes Take 1 tablet (5 mg total) by mouth every evening. Dorothyann Peng, MD Taking Active   Magnesium Oxide 420 MG TABS [id number] Yes Take 1 tablet by mouth daily. [provider] Taking Active Self  Multiple Vitamin (MULTIVITAMIN) tablet [id number] Yes Take 1 tablet by mouth daily. [provider] Taking Active   olmesartan-hydrochlorothiazide (BENICAR HCT) 20-12.5 MG tablet [id number] Yes Take 1 tablet by mouth daily. Dorothyann Peng, MD Taking Active   rosuvastatin (CRESTOR) 10 MG tablet [id number] Yes TAKE 1 TABLET BY MOUTH EVERY MONDAY, TUESDAY, WEDNESDAY, THURS, AND FRIDAY FOR 20 DAYS Dorothyann Peng, MD Taking Active   tadalafil (CIALIS) 5 MG tablet [id number] Yes TAKE 1 TABLET BY MOUTH ONCE A DAY. Dorothyann Peng, MD Taking Active   UNABLE TO FIND [id number] Yes Take 1 capsule by mouth daily at 2 am. Med Name: testosterone [provider] Taking Active             SDOH:  (Social Determinants of Health) assessments and interventions performed: Yes SDOH Interventions    Flowsheet Row Care Coordination from 12/07/2022 in [facility] Clinical Support from 12/01/2022 in [facility] Chronic Care Management from  09/29/2020 in [facility] Chronic Care Management from 02/28/2020 in [facility] Chronic Care Management from 01/20/2020 in [facility] Chronic Care Management from 01/15/2020 in [facility]  SDOH Interventions        Food Insecurity Interventions -- Intervention Not Indicated -- -- -- --  Housing Interventions -- Intervention Not Indicated -- -- -- --  Transportation Interventions -- Intervention Not Indicated -- -- -- --  Utilities Interventions -- Intervention Not Indicated -- -- -- --  Financial Strain Interventions --  [Patient assistance for medication cost] Intervention Not Indicated Other (Comment)  [Pharmacy team collaboration with patient to complete patient assistance.] Other (Comment)  [Continue to assist with [name] XR patient assistance as needed] Other (Comment)  [Completed [name] XR patient assistance application and attestation form mailed to [employer]] Other (Comment)  [Assist patient with completing [name] XR patient assistance application through [employer]]  Physical Activity Interventions -- Intervention Not Indicated -- -- -- --  Stress Interventions -- Intervention Not Indicated -- -- -- --       Medication Assistance: Application for Synjardy XR 50-1000 mg   medication assistance program. in process.  Anticipated assistance start date 12/2022.  See plan of care for additional detail.  Medication Access: Name and location of current pharmacy:  [facility] - [place], [place] - [address] [place] [address] Phone: [phone] Fax: [phone]  [facility] - [place], [place] - [address] [place] [address] Phone: [phone] Fax: [phone]  [facility] - [place], [place] - [address] [place]  [place] [address] Phone: [phone] Fax:  [phone]  Within the past 30 days, how often has patient missed a dose of medication? None   Is a pillbox or other method used to improve adherence? No  Factors that may affect medication adherence? financial need and lack of understanding of disease management Are meds synced by current pharmacy? No  Are meds delivered by current pharmacy? No  Does patient experience delays in picking up medications due to transportation concerns? No   Compliance/Adherence/Medication fill history: Care Gaps: Annual wellness visit in last year? Yes Covid booster overdue   If Diabetic: Last eye exam / retinopathy screening: 06-29-2022 Last diabetic foot exam: None noted  Star-Rating Drugs: Olme/HCTZ 20-12.5 mg- Last filled 10-26-2022 90 DS [facility]. Previous 07-27-2022 90 DS Rosuvastatin 10 mg- Last filled 11-08-2022 30 DS [facility]. Previous 10-04-2022 30 DS. [name] 50-1000 mg- PAP   Assessment/Plan   Diabetes (A1c goal <8%) -Controlled -Current medications: [name] XR 50-1000 mg tablet - taking 1 tablet by mouth twice per day Appropriate, Query effective -Current home glucose readings fasting glucose: 117 - 112  -Denies hypoglycemic/hyperglycemic symptoms -Current meal patterns:  breakfast: bacon egg and cheese biscuit dinner: BLT snacks: fruit  drinks: coffee, water, he is also making his own lemonade where he adds a 1/2 cup of sugar, he drinks 6 ounces per day  -Current exercise: [facility] and 24/7 the other days of the week, he is also walking around the track in the evenings. He goes around the track 20 times - 60 minutes, and at the gym he does 15-20 minutes of cardio - he is doing this consistently 2 days per week  -Educated on Complications of diabetes including kidney damage, retinal damage, and cardiovascular disease; Carbohydrate counting and/or plate method Patient to be started on  Synjardy XR 25-1000 mg tablet  -PATIENT HAS SAMPLES Sample patient received in office from PCP stating that this medication might be started.  He was counseled in great detail about the importance of hydration.  -Counseled to check feet daily and get yearly eye exams -Collaborated with patient and PCP and determined patient should be started on Synjardy XR 25 mg- 1000 mg tablet once per day, patient will need patient assistance for medications  Cherylin Mylar, CPP, PharmD Clinical Pharmacist Practitioner [facility] [phone]

## 2022-12-08 ENCOUNTER — Other Ambulatory Visit: Payer: Self-pay

## 2022-12-08 MED ORDER — ROSUVASTATIN CALCIUM 10 MG PO TABS: ORAL_TABLET | ORAL | 1 refills | Status: DC

## 2022-12-13 ENCOUNTER — Telehealth: Payer: Self-pay

## 2022-12-13 NOTE — Telephone Encounter (Signed)
Mr. Laffin came in today to fill out paperwork for patient assistance for Synjardy XR since he will be changing to Synjardy XR sample today. He is going to bring in his financial documentation for this year. He is also brought in the [name] XR that was last shipped from patient assistance. Will notify PAP company that patient has discontinued this medication.  Cherylin Mylar, CPP, PharmD Clinical Pharmacist Practitioner [facility] [phone]

## 2022-12-14 ENCOUNTER — Ambulatory Visit: Payer: Self-pay

## 2022-12-14 ENCOUNTER — Telehealth: Payer: Self-pay

## 2022-12-14 NOTE — Patient Outreach (Signed)
  Care Coordination   12/14/2022 Name: BRAYSEN HYDEN MRN: [id number] DOB: [birth year]   Care Coordination Outreach Attempts:  An unsuccessful telephone outreach was attempted for a scheduled appointment today.  Follow Up Plan:  Additional outreach attempts will be made to offer the patient care coordination information and services.   Encounter Outcome:  Pt. Request to Call Back   Care Coordination Interventions:  No, not indicated    Delsa Sale, RN, BSN, CCM Care Management Coordinator [facility] Care Management  Direct Phone: [phone]

## 2022-12-14 NOTE — Progress Notes (Signed)
12-14-2022: 1st attempt to follow up on the start of synjardy and get BS readings per Andres [name]

## 2022-12-16 ENCOUNTER — Telehealth: Payer: Self-pay

## 2022-12-16 NOTE — Progress Notes (Signed)
Care Management & Coordination Services Pharmacy Team  Reason for Encounter: Appointment Reminder  Contacted patient to confirm telephone appointment with Cherylin Mylar, PharmD on 12-20-2022 at 9:00. Unsuccessful outreach. Left voicemail for patient to return call.  Chart review: Recent office visits:  None  Recent consult visits:  None  Hospital visits:  None in previous 6 months   Star Rating Drugs:  Olme/HCTZ 20-12.5 mg- Last filled 10-26-2022 90 DS [facility]. Previous 07-27-2022 90 DS Rosuvastatin 10 mg- Last filled 11-08-2022 30 DS [facility]. Previous 10-04-2022 30 DS. [name] 50-1000 mg- PAP in process samples given  Care Gaps: Annual wellness visit in last year? Yes  If Diabetic: Last eye exam / retinopathy screening: 06-29-2022 Last diabetic foot exam: none noted   Huey Romans [facility] Clinical Pharmacist Assistant [phone]

## 2022-12-20 ENCOUNTER — Ambulatory Visit: Payer: Medicare PPO

## 2022-12-20 ENCOUNTER — Other Ambulatory Visit: Payer: Self-pay | Admitting: Internal Medicine

## 2022-12-20 DIAGNOSIS — E1122 Type 2 diabetes mellitus with diabetic chronic kidney disease: Secondary | ICD-10-CM

## 2022-12-20 DIAGNOSIS — E1110 Type 2 diabetes mellitus with ketoacidosis without coma: Secondary | ICD-10-CM

## 2022-12-20 MED ORDER — SYNJARDY XR 25-1000 MG PO TB24: 1.0000 | ORAL_TABLET | Freq: Every day | ORAL | Status: DC

## 2022-12-20 NOTE — Progress Notes (Signed)
Care Management & Coordination Services Pharmacy Note  12/20/2022 Name:  AMIIR WOODING MRN:  [id number] DOB:  Jul 15, 1950  Summary: Mr. Streng reports starting Synjardy XR 25-1000 MG tablet once per day last week. He is going to increase the amount of water he is drinking to at least 4 bottles of water per day. Labs to be completed during office visit on 01/25/2023 to confirm scr change is less than 30%.  He is going to continue to eat whole fresh fruit for dessert.   Recommendations/Changes made from today's visit: Recommend patient continue current medication regimen of Synjardy XR 25-1000 mg tablet  Patient to have blood work completed during office visit with PCP on 01/25/23, confirm no SCR change >30 Added Synjardy XR 25-1000 mg tablet once per day to patients current medication regimen and discontinued [name].   Follow up plan: Recommend patient continue current medication regimen.  Mr. Khouri is going to bring in his financial documentation for the [facility] patient assistance application.  Collaborate with PCP to confirm clinical monitoring paramaters, and placed lab orders for 01/25/2023  Over the next two months Mr. Meda will drink at least 4 bottles of water per day.  HC to call in two weeks to review Mr. Swett BS readings, and water intake.    Subjective: DENO DILKS is an 72 y.o. year old male who is a primary patient of Dorothyann Peng, MD.  The care coordination team was consulted for assistance with disease management and care coordination needs.    Engaged with patient by telephone for follow up visit.  Recent office visits:  None   Recent consult visits:  None   Hospital visits:  None in previous 6 months   Objective:  Lab Results  Component Value Date   CREATININE 1.32 (H) 11/28/2022   BUN 18 11/28/2022   EGFR 58 (L) 11/28/2022   GFRNONAA 62 06/29/2020   GFRAA 71 06/29/2020   NA 139 11/28/2022   K 4.7 11/28/2022   CALCIUM 9.9 11/28/2022   CO2 22  11/28/2022   GLUCOSE 124 (H) 11/28/2022    Lab Results  Component Value Date/Time   HGBA1C 6.9 (H) 08/01/2022 11:35 AM   HGBA1C 7.1 (H) 03/30/2022 12:45 PM   HGBA1C 7.7 02/13/2018 12:00 AM   MICROALBUR 30 11/07/2019 11:30 AM   MICROALBUR 10 10/30/2018 10:29 AM    Last diabetic Eye exam:  Lab Results  Component Value Date/Time   HMDIABEYEEXA No Retinopathy 06/29/2022 12:00 AM    Last diabetic Foot exam: No results found for: "HMDIABFOOTEX"   Lab Results  Component Value Date   CHOL 119 11/28/2022   HDL 31 (L) 11/28/2022   LDLCALC 62 11/28/2022   TRIG 147 11/28/2022   CHOLHDL 3.8 11/28/2022       Latest Ref Rng & Units 11/28/2022   10:36 AM 08/01/2022   11:35 AM 11/15/2021   11:05 AM  Hepatic Function  Total Protein 6.0 - 8.5 g/dL 8.0  7.5  7.3   Albumin 3.9 - 4.9 g/dL 4.7  4.5  4.4   AST 0 - 40 IU/L 20  23  25    ALT 0 - 44 IU/L 23  29  28    Alk Phosphatase 44 - 121 IU/L 65  61  58   Total Bilirubin 0.0 - 1.2 mg/dL 0.5  0.3  0.5     Lab Results  Component Value Date/Time   TSH 2.140 11/28/2022 10:36 AM   TSH 2.260 11/09/2020 12:22 PM  Latest Ref Rng & Units 11/28/2022   10:36 AM 03/30/2022   12:45 PM 11/15/2021   11:05 AM  CBC  WBC 3.4 - 10.8 x10E3/uL 7.5  9.0  6.4   Hemoglobin 13.0 - 17.7 g/dL 16.1  09.6  04.5   Hematocrit 37.5 - 51.0 % 41.1  41.6  36.8   Platelets 150 - 450 x10E3/uL 239  234  227     Lab Results  Component Value Date/Time   VITAMINB12 311 11/09/2020 12:22 PM    Clinical ASCVD: No  The ASCVD Risk score (Arnett DK, et al., 2019) failed to calculate for the following reasons:   The systolic blood pressure is missing   The valid total cholesterol range is 130 to 320 mg/dL        10/17/8117   14:78 AM 11/28/2022    9:55 AM 08/01/2022   10:39 AM  Depression screen PHQ 2/9  Decreased Interest 0 0 0  Down, Depressed, Hopeless 0 0 0  PHQ - 2 Score 0 0 0  Altered sleeping  0   Tired, decreased energy  0   Change in appetite  0    Feeling bad or failure about yourself   0   Trouble concentrating  0   Moving slowly or fidgety/restless  0   Suicidal thoughts  0   PHQ-9 Score  0   Difficult doing work/chores  Not difficult at all      Social History   Tobacco Use  Smoking Status Former   Packs/day: 0.50   Years: 22.00   Additional pack years: 0.00   Total pack years: 11.00   Types: Cigarettes   Quit date: 07/11/2011   Years since quitting: 11.4  Smokeless Tobacco Never  Tobacco Comments   he has quit   BP Readings from Last 3 Encounters:  11/28/22 128/70  09/01/22 126/86  08/01/22 136/80   Pulse Readings from Last 3 Encounters:  11/28/22 80  09/01/22 (!) 104  08/01/22 76   Wt Readings from Last 3 Encounters:  12/01/22 225 lb (102.1 kg)  11/28/22 225 lb 9.6 oz (102.3 kg)  09/01/22 225 lb 12.8 oz (102.4 kg)   BMI Readings from Last 3 Encounters:  12/01/22 30.52 kg/m  11/28/22 30.60 kg/m  09/01/22 30.62 kg/m    No Known Allergies  Medications Reviewed Today     Reviewed by Barb Merino, LPN (Licensed Practical Nurse) on 12/01/22 at 1058  Med List Status: <None>   Medication Order Taking? Sig Documenting Provider Last Dose Status Informant  Accu-Chek FastClix Lancets MISC [id number] Yes One tab po qd dx; e11.65 Dorothyann Peng, MD Taking Active   ACCU-CHEK GUIDE test strip [id number] Yes Use to check BS qd dx: E11.65 Dorothyann Peng, MD Taking Active   Arginine 500 MG CAPS [id number] Yes Take by mouth. Daily [provider] Taking Active Self  aspirin 81 MG tablet [id number] Yes Take 81 mg by mouth daily. [provider] Taking Active   cholecalciferol (VITAMIN D) 1000 UNITS tablet [id number] Yes Take 4,000 Units by mouth daily. [provider] Taking Active   Cyanocobalamin (VITAMIN B 12) 250 MCG LOZG [id number] Yes Take 1 tablet by mouth every other day. [provider] Taking Active   Ferrous Sulfate (IRON PO) [id number] Yes Take 1 capsule by mouth daily  at 2 am. [provider] Taking Active   fluticasone (CUTIVATE) 0.05 % cream [id number] Yes Apply topically 2 (two) times daily as needed. Dorothyann Peng, MD Taking Active  [name] XR 50-1000 MG TB24 [id number]  Take 1 tablet by mouth 2 (two) times daily. Dorothyann Peng, MD  Expired 08/01/22 2359   levocetirizine (XYZAL) 5 MG tablet [id number] Yes Take 1 tablet (5 mg total) by mouth every evening. Dorothyann Peng, MD Taking Active   Magnesium Oxide 420 MG TABS [id number] Yes Take 1 tablet by mouth daily. [provider] Taking Active Self  Multiple Vitamin (MULTIVITAMIN) tablet [id number] Yes Take 1 tablet by mouth daily. [provider] Taking Active   olmesartan-hydrochlorothiazide (BENICAR HCT) 20-12.5 MG tablet [id number] Yes Take 1 tablet by mouth daily. Dorothyann Peng, MD Taking Active   rosuvastatin (CRESTOR) 10 MG tablet [id number] Yes TAKE 1 TABLET BY MOUTH EVERY MONDAY, TUESDAY, WEDNESDAY, THURS, AND FRIDAY FOR 20 DAYS Dorothyann Peng, MD Taking Active   tadalafil (CIALIS) 5 MG tablet [id number] Yes TAKE 1 TABLET BY MOUTH ONCE A DAY. Dorothyann Peng, MD Taking Active   UNABLE TO FIND [id number] Yes Take 1 capsule by mouth daily at 2 am. Med Name: testosterone [provider] Taking Active             SDOH:  (Social Determinants of Health) assessments and interventions performed: No SDOH Interventions    Flowsheet Row Care Coordination from 12/07/2022 in [facility] Clinical Support from 12/01/2022 in [facility] Chronic Care Management from 09/29/2020 in [facility] Chronic Care Management from 02/28/2020 in [facility] Chronic Care Management from 01/20/2020 in [facility] Chronic Care Management from 01/15/2020 in [facility]  SDOH Interventions        Food Insecurity  Interventions -- Intervention Not Indicated -- -- -- --  Housing Interventions -- Intervention Not Indicated -- -- -- --  Transportation Interventions -- Intervention Not Indicated -- -- -- --  Utilities Interventions -- Intervention Not Indicated -- -- -- --  Financial Strain Interventions --  [Patient assistance for medication cost] Intervention Not Indicated Other (Comment)  [Pharmacy team collaboration with patient to complete patient assistance.] Other (Comment)  [Continue to assist with [name] XR patient assistance as needed] Other (Comment)  [Completed [name] XR patient assistance application and attestation form mailed to [employer]] Other (Comment)  [Assist patient with completing [name] XR patient assistance application through [employer]]  Physical Activity Interventions -- Intervention Not Indicated -- -- -- --  Stress Interventions -- Intervention Not Indicated -- -- -- --       Medication Assistance: Application for Synjardy XR 25-1000 mg  medication assistance program. in process.  Anticipated assistance start date 01/2023.  See plan of care for additional detail.  Mr. Crofts would like to bring in his SSI income documentation during his appointment on 01/25/2023, he provided this information to me and I have placed it on his application, will send today via fax.   Medication Access: Name and location of current pharmacy:  [facility] - [place], [place] - [address] [place] [address] Phone: [phone] Fax: [phone]  [facility] - [place], [place] - [address] [place] [address] Phone: [phone] Fax: [phone]  [facility] [address] [place] - [address] [place] [address] [place] [address] [place] [address] Phone: [phone] Fax: [phone]  Within the past 30 days, how often has patient missed a dose of medication? He has not missed any of his medications  Is a pillbox or other method used  to improve  adherence? No  Factors that may affect medication adherence? financial need and lack of understanding of disease management Are meds synced by current pharmacy? No  Are meds delivered by current pharmacy? No  Does patient experience delays in picking up medications due to transportation concerns? No   Compliance/Adherence/Medication fill history: Care Gaps: Annual wellness visit in last year? Yes   If Diabetic: Last eye exam / retinopathy screening: 06-29-2022 Last diabetic foot exam: none noted  [facility] Drugs: Olme/HCTZ 20-12.5 mg- Last filled 10-26-2022 90 DS [facility]. Previous 07-27-2022 90 DS Rosuvastatin 10 mg- Last filled 11-08-2022 30 DS [facility]. Previous 10-04-2022 30 DS. Synjardy XR 25-1000 mg- PAP in process samples given   Assessment/Plan   Diabetes (A1c goal <7%) -Controlled -Current medications: Synjardy XR 25-1000 mg tablet once per day Appropriate, Effective, Safe, Query accessible Patient currently using samples received at last office visit, started medication on June 5th, 2024  -Current home glucose readings fasting glucose: 127, 125, 111 - he reports that since he started taking he medication  -Denies hypoglycemic/hyperglycemic symptoms -Current meal patterns:  breakfast: sandwich that morning   lunch: home made salad  - lettuce, cucumber and onion and dressing - french  dinner: fried chicken, salad  snacks: piece of pineapple, instead of cookie  drinks: he is drinking plenty of water he usually has at least 3 bottles of the 16.9 ounce bottles of water  -Current exercise: he is exercising some  -Educated on Benefits of routine self-monitoring of blood sugar; -Counseled on the need to drink plenty of water through the day  -Congratulated Mr. Stapf on eating fruit instead of traditional dessert like cakes and pies.  -Counseled to check feet daily and get yearly eye exams -Collaborated with patient to discuss how he has been doing with his new  medication  Cherylin Mylar, CPP, PharmD Clinical Pharmacist Practitioner [facility] [phone]

## 2022-12-21 ENCOUNTER — Telehealth: Payer: Self-pay

## 2022-12-21 NOTE — Progress Notes (Cosign Needed)
Faxed completed application to [facility] patient assistance for Synjardy XR 25/1000 mg.    Billee Cashing, CMA Clinical Pharmacist Assistant [phone]

## 2022-12-23 ENCOUNTER — Telehealth: Payer: Self-pay

## 2022-12-28 ENCOUNTER — Ambulatory Visit: Payer: Self-pay

## 2022-12-28 NOTE — Patient Outreach (Signed)
  Care Coordination   Follow Up Visit Note   12/28/2022 Name: Andres [name] MRN: [id number] DOB: 1950-08-11  Andres [name] is a 72 y.o. year old male who sees Dorothyann Peng, MD for primary care. I spoke with  Lennie L Every by phone today.  What matters to the patients health and wellness today?  Patient will increase his daily water intake as directed. He will try to add some healthy fats to his diet.     Goals Addressed               This Visit's Progress     Patient Stated     To lower A1c<6.5 % (pt-stated)        Care Coordination Interventions: Evaluation of current treatment plan related to type 2 diabetes mellitus  and patient's adherence to plan as established by provider Review of patient status, including review of consultants reports, relevant laboratory and other test results, and medications completed Educated patient about taking Synjardy, educated patient with rationale provided to increase daily water intake to 48-64 oz daily unless otherwise directed Determined patient is down to his last 5 tablets of Synjardy from samples previously provided by PCP   Reviewed and discussed with patient, his application for patient assistance was faxed to [facility] on 12/21/22 per chart review  Sent message to Dr. Allyne Gee advising patient is low on Synjardy and requesting additional samples if available  Lab Results  Component Value Date   HGBA1C 6.9 (H) 08/01/2022        Other     Improve HDL >40        Care Coordination Interventions: Provider established cholesterol goals reviewed Provided HLD educational materials Reviewed importance of adding foods with healthy fats to diet Reviewed exercise goals and target of 150 minutes per week Lipid Panel     Component Value Date/Time   CHOL 119 11/28/2022 1036   TRIG 147 11/28/2022 1036   HDL 31 (L) 11/28/2022 1036   CHOLHDL 3.8 11/28/2022 1036   LDLCALC 62 11/28/2022 1036   LABVLDL 26 11/28/2022 1036        Interventions Today    Flowsheet Row Most Recent Value  Chronic Disease   Chronic disease during today's visit Diabetes, Other  [low HDL]  General Interventions   General Interventions Discussed/Reviewed General Interventions Discussed, General Interventions Reviewed, Labs, Doctor Visits, Lipid Profile  Doctor Visits Discussed/Reviewed PCP, Doctor Visits Reviewed, Doctor Visits Discussed  Exercise Interventions   Exercise Discussed/Reviewed Exercise Reviewed, Exercise Discussed, Physical Activity  Physical Activity Discussed/Reviewed Physical Activity Reviewed, Physical Activity Discussed  Education Interventions   Education Provided Provided Education, Provided Printed Education  Provided Verbal Education On Labs, Blood Sugar Monitoring, When to see the doctor, Medication, Exercise, Nutrition  Labs Reviewed Lipid Profile, Kidney Function  Nutrition Interventions   Nutrition Discussed/Reviewed Fluid intake, Nutrition Discussed, Nutrition Reviewed  [adding healthy fats to diet]  Pharmacy Interventions   Pharmacy Dicussed/Reviewed Pharmacy Topics Reviewed, Pharmacy Topics Discussed, Medications and their functions, Affording Medications          SDOH assessments and interventions completed:  No     Care Coordination Interventions:  Yes, provided   Follow up plan: Follow up call scheduled for 01/27/23 @12  PM    Encounter Outcome:  Pt. Visit Completed

## 2022-12-28 NOTE — Patient Instructions (Signed)
Visit Information  Thank you for taking time to visit with me today. Please don't hesitate to contact me if I can be of assistance to you.   Following are the goals we discussed today:   Goals Addressed               This Visit's Progress     Patient Stated     To lower A1c<6.5 % (pt-stated)        Care Coordination Interventions: Evaluation of current treatment plan related to type 2 diabetes mellitus  and patient's adherence to plan as established by provider Review of patient status, including review of consultants reports, relevant laboratory and other test results, and medications completed Educated patient about taking Synjardy, educated patient with rationale provided to increase daily water intake to 48-64 oz daily unless otherwise directed Determined patient is down to his last 5 tablets of Synjardy from samples previously provided by PCP   Reviewed and discussed with patient, his application for patient assistance was faxed to [facility] on 12/21/22 per chart review  Sent message to Dr. Allyne Gee advising patient is low on Synjardy and requesting additional samples if available  Lab Results  Component Value Date   HGBA1C 6.9 (H) 08/01/2022       Other     Improve HDL >40        Care Coordination Interventions: Provider established cholesterol goals reviewed Provided HLD educational materials Reviewed importance of adding foods with healthy fats to diet Reviewed exercise goals and target of 150 minutes per week Lipid Panel     Component Value Date/Time   CHOL 119 11/28/2022 1036   TRIG 147 11/28/2022 1036   HDL 31 (L) 11/28/2022 1036   CHOLHDL 3.8 11/28/2022 1036   LDLCALC 62 11/28/2022 1036   LABVLDL 26 11/28/2022 1036          Our next appointment is by telephone on 01/27/23 at 12 PM  Please call the care guide team at [phone] if you need to cancel or reschedule your appointment.   If you are experiencing a Mental Health or Behavioral Health Crisis or  need someone to talk to, please call 1-800-273-TALK (toll free, 24 hour hotline)  Patient verbalizes understanding of instructions and care plan provided today and agrees to view in MyChart. Active MyChart status and patient understanding of how to access instructions and care plan via MyChart confirmed with patient.     Delsa Sale, RN, BSN, CCM Care Management Coordinator [facility] Care Management  Direct Phone: [phone]

## 2023-01-25 ENCOUNTER — Ambulatory Visit (INDEPENDENT_AMBULATORY_CARE_PROVIDER_SITE_OTHER): Payer: Medicare PPO | Admitting: Internal Medicine

## 2023-01-25 ENCOUNTER — Other Ambulatory Visit: Payer: Self-pay | Admitting: Internal Medicine

## 2023-01-25 ENCOUNTER — Encounter: Payer: Self-pay | Admitting: Internal Medicine

## 2023-01-25 VITALS — BP 106/78 | HR 85 | Temp 97.9°F | Ht 72.0 in | Wt 228.4 lb

## 2023-01-25 DIAGNOSIS — N182 Chronic kidney disease, stage 2 (mild): Secondary | ICD-10-CM | POA: Diagnosis not present

## 2023-01-25 DIAGNOSIS — Z6830 Body mass index (BMI) 30.0-30.9, adult: Secondary | ICD-10-CM

## 2023-01-25 DIAGNOSIS — I129 Hypertensive chronic kidney disease with stage 1 through stage 4 chronic kidney disease, or unspecified chronic kidney disease: Secondary | ICD-10-CM | POA: Diagnosis not present

## 2023-01-25 DIAGNOSIS — E6609 Other obesity due to excess calories: Secondary | ICD-10-CM | POA: Diagnosis not present

## 2023-01-25 DIAGNOSIS — E7800 Pure hypercholesterolemia, unspecified: Secondary | ICD-10-CM

## 2023-01-25 DIAGNOSIS — Z794 Long term (current) use of insulin: Secondary | ICD-10-CM | POA: Diagnosis not present

## 2023-01-25 DIAGNOSIS — E1122 Type 2 diabetes mellitus with diabetic chronic kidney disease: Secondary | ICD-10-CM | POA: Diagnosis not present

## 2023-01-25 NOTE — Progress Notes (Signed)
I,[name], CMA,acting as a Neurosurgeon for [name], MD.,have documented all relevant documentation on the behalf of [name], MD,as directed by  [name], MD while in the presence of [name], MD.  Subjective:  Patient ID: Andres [name] , male    DOB: [birth year] , 72 y.o.   MRN: [id number]  Chief Complaint  Patient presents with   Diabetes   Hypertension    HPI  He presents for DM/HTN follow-up. He reports compliance with meds. He denies headaches, chest pain and shortness of breath.  At previous visit Synjardy XR 25/1000mg  added to his regimen. He feels this medication is not effective. He admits noticing his BS are higher than before. The highest he has gotten:271  BS taken today while in the office: 193  Diabetes He presents for his follow-up diabetic visit. He has type 2 diabetes mellitus. His disease course has been stable. There are no hypoglycemic associated symptoms. Pertinent negatives for diabetes include no blurred vision, no chest pain, no polydipsia, no polyphagia and no polyuria. There are no hypoglycemic complications. Diabetic complications include nephropathy. Risk factors for coronary artery disease include diabetes mellitus, dyslipidemia, hypertension and male sex. He is following a diabetic diet. His breakfast blood glucose is taken between 8-9 am. His breakfast blood glucose range is generally 90-110 mg/dl. An ACE inhibitor/angiotensin II receptor blocker is being taken. Eye exam is not current.  Hypertension This is a chronic problem. The current episode started more than 1 year ago. The problem has been gradually improving since onset. The problem is controlled. Pertinent negatives include no blurred vision, chest pain, palpitations or shortness of breath. Past treatments include angiotensin blockers and diuretics. The current treatment provides moderate improvement.     Past Medical History:  Diagnosis Date   Diabetes (HCC)    High  cholesterol    Hypertension    states under control with med., has been on med. x 5 yr.   Seasonal allergies    Umbilical hernia 09/2014     Family History  Problem Relation Age of Onset   Healthy Mother    Early death Father      Current Outpatient Medications:    Accu-Chek FastClix Lancets MISC, One tab po qd dx; e11.65, Disp: 100 each, Rfl: 11   ACCU-CHEK GUIDE test strip, Use to check BS qd dx: E11.65, Disp: 100 each, Rfl: 11   Arginine 500 MG CAPS, Take by mouth. Daily, Disp: , Rfl:    aspirin 81 MG tablet, Take 81 mg by mouth daily., Disp: , Rfl:    cholecalciferol (VITAMIN D) 1000 UNITS tablet, Take 4,000 Units by mouth daily., Disp: , Rfl:    Cyanocobalamin (VITAMIN B 12) 250 MCG LOZG, Take 1 tablet by mouth every other day., Disp: , Rfl:    Empagliflozin-metFORMIN HCl ER (SYNJARDY XR) 25-1000 MG TB24, Take 1 tablet by mouth daily., Disp: 30 tablet, Rfl:    Ferrous Sulfate (IRON PO), Take 1 capsule by mouth daily at 2 am., Disp: , Rfl:    fluticasone (CUTIVATE) 0.05 % cream, Apply topically 2 (two) times daily as needed., Disp: 60 g, Rfl: 1   levocetirizine (XYZAL) 5 MG tablet, Take 1 tablet (5 mg total) by mouth every evening., Disp: 90 tablet, Rfl: 2   Magnesium Oxide 420 MG TABS, Take 1 tablet by mouth daily., Disp: , Rfl:    Multiple Vitamin (MULTIVITAMIN) tablet, Take 1 tablet by mouth daily., Disp: , Rfl:  olmesartan-hydrochlorothiazide (BENICAR HCT) 20-12.5 MG tablet, Take 1 tablet by mouth daily., Disp: 90 tablet, Rfl: 2   rosuvastatin (CRESTOR) 10 MG tablet, TAKE 1 TABLET BY MOUTH EVERY MONDAY, TUESDAY, WEDNESDAY, THURS, AND FRIDAY FOR 20 DAYS, Disp: 90 tablet, Rfl: 1   tadalafil (CIALIS) 5 MG tablet, TAKE 1 TABLET BY MOUTH ONCE A DAY., Disp: 30 tablet, Rfl: 2   UNABLE TO FIND, Take 1 capsule by mouth daily at 2 am. Med Name: testosterone, Disp: , Rfl:    No Known Allergies   Review of Systems  Constitutional: Negative.   HENT: Negative.    Eyes:  Negative for  blurred vision.  Respiratory: Negative.  Negative for shortness of breath.   Cardiovascular:  Negative for chest pain and palpitations.  Gastrointestinal: Negative.   Endocrine: Negative for polydipsia, polyphagia and polyuria.  Skin: Negative.   Allergic/Immunologic: Negative.   Neurological: Negative.   Psychiatric/Behavioral: Negative.  Negative for decreased concentration.      Today's Vitals   01/25/23 0954  BP: 106/78  Pulse: 85  Temp: 97.9 F (36.6 C)  SpO2: 98%  Weight: 228 lb 6.4 oz (103.6 kg)  Height: 6' (1.829 m)   Body mass index is 30.98 kg/m.  Wt Readings from Last 3 Encounters:  01/25/23 228 lb 6.4 oz (103.6 kg)  12/01/22 225 lb (102.1 kg)  11/28/22 225 lb 9.6 oz (102.3 kg)    The ASCVD Risk score (Arnett DK, et al., 2019) failed to calculate for the following reasons:   The valid total cholesterol range is 130 to 320 mg/dL  Objective:  Physical Exam Vitals and nursing note reviewed.  Constitutional:      Appearance: Normal appearance.  HENT:     Head: Normocephalic and atraumatic.  Eyes:     Extraocular Movements: Extraocular movements intact.  Cardiovascular:     Rate and Rhythm: Normal rate and regular rhythm.     Heart sounds: Normal heart sounds.  Pulmonary:     Effort: Pulmonary effort is normal.     Breath sounds: Normal breath sounds.  Musculoskeletal:     Cervical back: Normal range of motion.  Skin:    General: Skin is warm.  Neurological:     General: No focal deficit present.     Mental Status: He is alert.  Psychiatric:        Mood and Affect: Mood normal.         Assessment And Plan:  Type 2 diabetes mellitus with stage 2 chronic kidney disease, with long-term current use of insulin (HCC) Assessment & Plan: Chronic, his sugars have started to increase. Pt advised that he may need to resume Januvia 100mg  daily OR change Synjardy XR to 12.5mg /1000mg  twice daily. He is encouraged to make necessary dietary changes as well. If dose  of Synjardy is increased, he will rto in four weeks for re-evaluation. If Januvia is added, he will rto in 3 months for re-evaluation.   Orders: -     Hemoglobin A1c -     BMP8+EGFR  Parenchymal renal hypertension, stage 1 through stage 4 or unspecified chronic kidney disease Assessment & Plan: Chronic, well controlled.  He will continue with olmesartan/hct 20/12.25mg  daily. He is reminded to follow a low sodium diet.    Pure hypercholesterolemia Assessment & Plan: Chronic,LDL goal < 70.  He will c/w rosuvastatin 10mg  daily.  Lab Results  Component Value Date   LDLCALC 62 11/28/2022      Class 1 obesity due to excess calories  with serious comorbidity and body mass index (BMI) of 30.0 to 30.9 in adult Assessment & Plan: His BMI is acceptable for his demographic. He is encouraged to aim for at least 150 minutes of exercise per week, including at least two days of strength training.    She is encouraged to strive for BMI less than 30 to decrease cardiac risk. Advised to aim for at least 150 minutes of exercise per week.   Return in 3 months (on 04/27/2023), or DM CHECK.  Patient was given opportunity to ask questions. Patient verbalized understanding of the plan and was able to repeat key elements of the plan. All questions were answered to their satisfaction.    I, [name], MD, have reviewed all documentation for this visit. The documentation on 02/06/23 for the exam, diagnosis, procedures, and orders are all accurate and complete.   IF YOU HAVE BEEN REFERRED TO A SPECIALIST, IT MAY TAKE 1-2 WEEKS TO SCHEDULE/PROCESS THE REFERRAL. IF YOU HAVE NOT HEARD FROM US/SPECIALIST IN TWO WEEKS, PLEASE GIVE [place] A CALL AT [phone].

## 2023-01-25 NOTE — Patient Instructions (Signed)

## 2023-01-26 LAB — BMP8+EGFR
BUN/Creatinine Ratio: 13 (ref 10–24)
BUN: 19 mg/dL (ref 8–27)
CO2: 23 mmol/L (ref 20–29)
Calcium: 9.5 mg/dL (ref 8.6–10.2)
Chloride: 97 mmol/L (ref 96–106)
Creatinine, Ser: 1.42 mg/dL — ABNORMAL HIGH (ref 0.76–1.27)
Glucose: 192 mg/dL — ABNORMAL HIGH (ref 70–99)
Potassium: 4.9 mmol/L (ref 3.5–5.2)
Sodium: 137 mmol/L (ref 134–144)
eGFR: 53 mL/min/{1.73_m2} — ABNORMAL LOW (ref >59)

## 2023-01-26 LAB — HEMOGLOBIN A1C
Est. average glucose Bld gHb Est-mCnc: 183 mg/dL
Hgb A1c MFr Bld: 8 % — ABNORMAL HIGH (ref 4.8–5.6)

## 2023-01-27 ENCOUNTER — Ambulatory Visit: Payer: Self-pay

## 2023-01-27 NOTE — Patient Outreach (Signed)
  Care Coordination   Follow Up Visit Note   01/27/2023 Name: Andres [name] MRN: [id number] DOB: March 14, 1951  Andres [name] is a 72 y.o. year old male who sees Dorothyann Peng, MD for primary care. I spoke with  Jonah L Mccarrick by phone today.  What matters to the patients health and wellness today?  Patient would like to work on lowering his A1c.     Goals Addressed               This Visit's Progress     Patient Stated     To lower A1c<6.5 % (pt-stated)        Care Coordination Interventions: Evaluation of current treatment plan related to type 2 diabetes mellitus  and patient's adherence to plan as established by provider Review of patient status, including review of consultants reports, relevant laboratory and other test results, and medications completed Educated patient about taking Synjardy, educated patient with rationale provided to increase daily water intake to 48-64 oz daily unless otherwise directed Reviewed and discussed PCP may consider increasing his Synjardy pending results of his A1c Discussed Catie Clearance [facility] will assist with resources for PAP for Synjardy as determined by Dr. Allyne Gee regarding dosage changes Sent in basket message to Catie [facility] requesting she outreach to patient regarding PAP for Synjardy  Assessed patient's understanding of A1c goal: <7% Lab Results  Component Value Date   HGBA1C 8.0 (H) 01/25/2023      Interventions Today    Flowsheet Row Most Recent Value  Chronic Disease   Chronic disease during today's visit Diabetes, Chronic Kidney Disease/End Stage Renal Disease (ESRD)  General Interventions   General Interventions Discussed/Reviewed General Interventions Discussed, General Interventions Reviewed, Labs, Doctor Visits  Doctor Visits Discussed/Reviewed Doctor Visits Discussed, Doctor Visits Reviewed, PCP  Education Interventions   Education Provided Provided Education  Provided Verbal Education On Labs, Medication, When to see the  doctor  Labs Reviewed Hgb A1c, Kidney Function  Pharmacy Interventions   Pharmacy Dicussed/Reviewed Pharmacy Topics Discussed, Pharmacy Topics Reviewed, Medications and their functions, Affording Medications          SDOH assessments and interventions completed:  No     Care Coordination Interventions:  Yes, provided   Follow up plan: Follow up call scheduled for 02/14/23 @11 :00 AM    Encounter Outcome:  Pt. Visit Completed

## 2023-01-27 NOTE — Patient Instructions (Signed)
Visit Information  Thank you for taking time to visit with me today. Please don't hesitate to contact me if I can be of assistance to you.   Following are the goals we discussed today:   Goals Addressed               This Visit's Progress     Patient Stated     To lower A1c<6.5 % (pt-stated)        Care Coordination Interventions: Evaluation of current treatment plan related to type 2 diabetes mellitus  and patient's adherence to plan as established by provider Review of patient status, including review of consultants reports, relevant laboratory and other test results, and medications completed Educated patient about taking Synjardy, educated patient with rationale provided to increase daily water intake to 48-64 oz daily unless otherwise directed Reviewed and discussed PCP may consider increasing his Synjardy pending results of his A1c Discussed Catie Clearance [facility] will assist with resources for PAP for Synjardy as determined by Dr. Allyne Gee regarding dosage changes Sent in basket message to Catie [facility] requesting she outreach to patient regarding PAP for Synjardy  Assessed patient's understanding of A1c goal: <7% Lab Results  Component Value Date   HGBA1C 8.0 (H) 01/25/2023          Our next appointment is by telephone on 02/14/23 at 11:00 AM  Please call the care guide team at [phone] if you need to cancel or reschedule your appointment.   If you are experiencing a Mental Health or Behavioral Health Crisis or need someone to talk to, please call 1-800-273-TALK (toll free, 24 hour hotline)  Patient verbalizes understanding of instructions and care plan provided today and agrees to view in MyChart. Active MyChart status and patient understanding of how to access instructions and care plan via MyChart confirmed with patient.     Delsa Sale, RN, BSN, CCM Care Management Coordinator [facility] Care Management  Direct Phone: [phone]

## 2023-01-31 ENCOUNTER — Other Ambulatory Visit (HOSPITAL_COMMUNITY): Payer: Self-pay

## 2023-01-31 ENCOUNTER — Other Ambulatory Visit: Payer: Self-pay | Admitting: Pharmacist

## 2023-01-31 NOTE — Progress Notes (Signed)
Care Coordination Call  Received message from RN CM that patient is unsure what are next steps related to diabetes management. Notes that [facility] had previously applied for [facility] Cares assistance for Synjardy XR 25/1000 mg daily, but that there was a conversation about maximizing metformin component, so changing to Synjardy XR 12.11/998 mg twice daily.   Per last lab results, Dr. Allyne Gee was interested in adding Januvia vs Ozempic. Discussed this with patient, he is uncomfortable with the idea of a weekly injection. Agree with Dr. Allyne Gee that GLP1 would be preferred given improved benefit, CV benefit, weight loss. Could consider Rybelsus if patient is against injections.   Patient notes that he prefers to maximize metformin dose first. Reviewed income, he is over income for Medicare Extra Help but meets income for [facility] for [place]. Will collaborate with CPhT team to outreach [employer] to follow up on status of previously submitted application.   Catie Eppie Gibson, PharmD, BCACP, CPP Clinical Pharmacist [facility] [phone]

## 2023-02-03 NOTE — Telephone Encounter (Signed)
Error

## 2023-02-06 DIAGNOSIS — I129 Hypertensive chronic kidney disease with stage 1 through stage 4 chronic kidney disease, or unspecified chronic kidney disease: Secondary | ICD-10-CM | POA: Insufficient documentation

## 2023-02-06 NOTE — Assessment & Plan Note (Signed)
Chronic,LDL goal < 70.  He will c/w rosuvastatin 10mg  daily.  Lab Results  Component Value Date   LDLCALC 62 11/28/2022

## 2023-02-06 NOTE — Assessment & Plan Note (Signed)
Chronic, his sugars have started to increase. Pt advised that he may need to resume Januvia 100mg  daily OR change Synjardy XR to 12.5mg /1000mg  twice daily. He is encouraged to make necessary dietary changes as well. If dose of Synjardy is increased, he will rto in four weeks for re-evaluation. If Januvia is added, he will rto in 3 months for re-evaluation.

## 2023-02-06 NOTE — Assessment & Plan Note (Signed)
His BMI is acceptable for his demographic. He is encouraged to aim for at least 150 minutes of exercise per week, including at least two days of strength training.

## 2023-02-06 NOTE — Assessment & Plan Note (Signed)
Chronic, well controlled.  He will continue with olmesartan/hct 20/12.25mg  daily. He is reminded to follow a low sodium diet.

## 2023-02-10 ENCOUNTER — Telehealth: Payer: Self-pay

## 2023-02-10 NOTE — Telephone Encounter (Signed)
I INITIATED PAP FOR BI-CARE SYNJARDY  Melanee Spry CPhT Rx Patient [facility]ocate [phone]

## 2023-02-10 NOTE — Telephone Encounter (Signed)
-----   Message from [employer] sent at 02/06/2023 10:26 AM EDT ----- I can't find the paperwork anywhere in the office. Could you please re-mail the BI Cares app to him? I'll call and let him know.   Thanks! ----- Message ----- From: Earnest Conroy, CPhT Sent: 01/31/2023   3:49 PM EDT To: Alden Hipp, RPH-CPP  Pt was denied because they said he checked he had Medicaid. They will need a denial letter from [facility] saying he does not have it. Or resubmitted app with it corrected. I asked if he has not apply for medicaid and can not produce a letter what would he have to do. Because he said he did they still may required that letter after it is corrected. ----- Message ----- From: Alden Hipp, RPH-CPP Sent: 01/31/2023  10:31 AM EDT To: Rx Med Assistance Team  When someone next calls BI, could someone check status of enrollment for Synjardy for this patient? Appears [employer] sent paperwork in on 6/12.   THANKS!  Catie

## 2023-02-14 ENCOUNTER — Ambulatory Visit: Payer: Self-pay

## 2023-02-14 NOTE — Patient Instructions (Signed)
Visit Information  Thank you for taking time to visit with me today. Please don't hesitate to contact me if I can be of assistance to you.   Following are the goals we discussed today:   Goals Addressed               This Visit's Progress     Patient Stated     To lower A1c<6.5 % (pt-stated)        Care Coordination Interventions: Evaluation of current treatment plan related to type 2 diabetes mellitus  and patient's adherence to plan as established by provider Review of patient status, including review of consultants reports, relevant laboratory and other test results, and medications completed Discussed with patient PCP added Januvia (Dr. Allyne Gee provided samples), to his current regimen and increased his dose of Synjardy Discussed patient is working with [employer] RPH-CPP for assistance with PAP Discussed he will continue to notify Dr. Allyne Gee when additional samples are needed prior to his PAP approval  Assessed patient's understanding of A1c goal: <7% Lab Results  Component Value Date   HGBA1C 8.0 (H) 01/25/2023          Our next appointment is by telephone on 04/07/23 at 11:00 AM  Please call the care guide team at [phone] if you need to cancel or reschedule your appointment.   If you are experiencing a Mental Health or Behavioral Health Crisis or need someone to talk to, please call 1-800-273-TALK (toll free, 24 hour hotline)  Patient verbalizes understanding of instructions and care plan provided today and agrees to view in MyChart. Active MyChart status and patient understanding of how to access instructions and care plan via MyChart confirmed with patient.     Delsa Sale, RN, BSN, CCM Care Management Coordinator [facility] Care Management  Direct Phone: [phone]

## 2023-02-14 NOTE — Telephone Encounter (Signed)
PAP application for Synjardy [facility])   has been mailed to pt home.   I will fax PCP pages once I receive pt pages.    PLEASE BE ADVISED

## 2023-02-14 NOTE — Patient Outreach (Signed)
  Care Coordination   Follow Up Visit Note   02/14/2023 Name: Andres [name] MRN: [id number] DOB: 1950-10-31  Andres [name] is a 72 y.o. year old male who sees Dorothyann Peng, MD for primary care. I spoke with  Andres [name] by phone today.  What matters to the patients health and wellness today?  Patient would like to work on lowering his A1c.     Goals Addressed               This Visit's Progress     Patient Stated     To lower A1c<6.5 % (pt-stated)        Care Coordination Interventions: Evaluation of current treatment plan related to type 2 diabetes mellitus  and patient's adherence to plan as established by provider Review of patient status, including review of consultants reports, relevant laboratory and other test results, and medications completed Discussed with patient PCP added Januvia (Dr. Allyne Gee provided samples), to his current regimen and increased his dose of Synjardy Discussed patient is working with [employer] RPH-CPP for assistance with PAP Discussed he will continue to notify Dr. Allyne Gee when additional samples are needed prior to his PAP approval  Assessed patient's understanding of A1c goal: <7% Lab Results  Component Value Date   HGBA1C 8.0 (H) 01/25/2023     Interventions Today    Flowsheet Row Most Recent Value  Chronic Disease   Chronic disease during today's visit Diabetes  General Interventions   General Interventions Discussed/Reviewed General Interventions Discussed, General Interventions Reviewed, Doctor Visits, Labs  Doctor Visits Discussed/Reviewed Doctor Visits Discussed, Doctor Visits Reviewed, PCP  Exercise Interventions   Exercise Discussed/Reviewed Physical Activity  Physical Activity Discussed/Reviewed Physical Activity Reviewed, Physical Activity Discussed, PREP  Education Interventions   Education Provided Provided Education, Provided Printed Education  Provided Verbal Education On Blood Sugar Monitoring, When to see the  doctor, Medication  Labs Reviewed Hgb A1c  Pharmacy Interventions   Pharmacy Dicussed/Reviewed Pharmacy Topics Reviewed, Pharmacy Topics Discussed, Medications and their functions, Affording Medications          SDOH assessments and interventions completed:  No     Care Coordination Interventions:  Yes, provided   Follow up plan: Follow up call scheduled for 04/07/23 @11 :00 AM    Encounter Outcome:  Pt. Visit Completed

## 2023-02-27 ENCOUNTER — Other Ambulatory Visit: Payer: Self-pay

## 2023-02-27 ENCOUNTER — Telehealth: Payer: Self-pay

## 2023-02-27 MED ORDER — SITAGLIPTIN PHOSPHATE 100 MG PO TABS: 100.0000 mg | ORAL_TABLET | Freq: Every day | ORAL | 1 refills | Status: DC

## 2023-02-27 NOTE — Telephone Encounter (Signed)
Patient given two samples of synjardy. Patient also requesting samples of another medication that he does not know the name of. He will bring box by office.

## 2023-02-28 NOTE — Telephone Encounter (Signed)
RECEIVED PT PAGES for SYNJARDY( BI CARES ([employer]) AND FAXED TO PROVIDER ROBYN [name] OFFICE   PLEASE BE ADVISED

## 2023-03-08 NOTE — Telephone Encounter (Signed)
PAP: Application for Kirk Ruths has been submitted to PAP Companies: [employer], via fax  PLEASED BE ADVISE

## 2023-03-10 NOTE — Telephone Encounter (Signed)
PAP: Patient assistance application for Kirk Ruths has been approved by PAP Companies: [employer] from 03/09/2023 to 07/11/2023. Medication should be delivered to PAP Delivery: Home For further shipping updates, please contact [employer] (BI Cares) at [phone] Pt ID is: NO ID    PLEASE BE ADVISED LETTER OF APPROVAL IS IN MEDIA IN CHART

## 2023-03-14 ENCOUNTER — Other Ambulatory Visit: Payer: Medicare PPO | Admitting: Pharmacist

## 2023-03-14 NOTE — Patient Instructions (Addendum)
Dryden,   It was great talking to you today!  When you receive the order, start Synjardy XR 12.11/998 mg twice daily.   Check your blood sugars twice daily:  1) Fasting, first thing in the morning before breakfast and  2) 2 hours after your largest meal.   For a goal A1c of less than 7%, goal fasting readings are less than 130 and goal 2 hour after meal readings are less than 180.    Take care!  Catie Eppie Gibson, PharmD, BCACP, CPP Clinical Pharmacist [facility] [phone]

## 2023-03-14 NOTE — Progress Notes (Signed)
03/14/2023 Name: Andres [name] MRN: [id number] DOB: 11-Aug-1950  Chief Complaint  Patient presents with   Medication Management   Diabetes    Andres [name] is a 72 y.o. year old male who presented for a telephone visit.   They were referred to the pharmacist by their PCP for assistance in managing diabetes.    Subjective:  Care Team: Primary Care Provider: Dorothyann Peng, MD ; Next Scheduled Visit: 04/05/23  Medication Access/Adherence  Current Pharmacy:  [facility] - [place], [place] - [address] [place] [address] Phone: [phone] Fax: [phone]  [facility] - [place], [place] - [address] [place] [address] Phone: [phone] Fax: [phone]  [facility] - [place], [place] - [address] [place] [address] Phone: [phone] Fax: [phone]   Patient reports affordability concerns with their medications: No  Patient reports access/transportation concerns to their pharmacy: No  Patient reports adherence concerns with their medications:  No     Diabetes:  Current medications: Synjardy XR 25/1000 mg daily - reports he has been out for about a week, prescribed Januvia 100 mg daily but notes he does not plan to continue to fill due to the cost   Current glucose readings: reports a fasting of 115 today, has not checked other readings.    Current medication access support: approved for [place] assistance.   Hypertension:  Current medications: olmesartan/hydrochlorothiazide 20/12.5 mg daily,   Hyperlipidemia/ASCVD Risk Reduction  Current lipid lowering medications: rosuvastatin 10 mg daily  Antiplatelet regimen: aspirin 81 mg daily    Objective:  Lab Results  Component Value Date   HGBA1C 8.0 (H) 01/25/2023    Lab Results  Component Value Date   CREATININE 1.42 (H) 01/25/2023   BUN 19 01/25/2023   NA 137 01/25/2023   K 4.9 01/25/2023   CL 97  01/25/2023   CO2 23 01/25/2023    Lab Results  Component Value Date   CHOL 119 11/28/2022   HDL 31 (L) 11/28/2022   LDLCALC 62 11/28/2022   TRIG 147 11/28/2022   CHOLHDL 3.8 11/28/2022    Medications Reviewed Today     Reviewed by Alden Hipp, RPH-CPP (Pharmacist) on 03/14/23 at 1550  Med List Status: <None>   Medication Order Taking? Sig Documenting Provider Last Dose Status Informant  Accu-Chek FastClix Lancets MISC [id number]  One tab po qd dx; e11.65 Dorothyann Peng, MD  Active   ACCU-CHEK GUIDE test strip [id number]  Use to check BS qd dx: E11.65 Dorothyann Peng, MD  Active   Arginine 500 MG CAPS [id number]  Take by mouth. Daily [provider]  Active Self  aspirin 81 MG tablet [id number]  Take 81 mg by mouth daily. [provider]  Active   cholecalciferol (VITAMIN D) 1000 UNITS tablet [id number]  Take 4,000 Units by mouth daily. [provider]  Active   Cyanocobalamin (VITAMIN B 12) 250 MCG LOZG [id number]  Take 1 tablet by mouth every other day. [provider]  Active   Empagliflozin-metFORMIN HCl ER (SYNJARDY XR) 25-1000 MG TB24 [id number] Yes Take 1 tablet by mouth daily. Dorothyann Peng, MD Taking Active   Ferrous Sulfate (IRON PO) [id number]  Take 1 capsule by mouth daily at 2 am. [provider]  Active   fluticasone (CUTIVATE) 0.05 % cream [id number]  Apply topically 2 (two) times daily as needed. Dorothyann Peng, MD  Active   levocetirizine (XYZAL) 5 MG tablet  [id number]  Take 1 tablet (5 mg total) by mouth every evening. Dorothyann Peng, MD  Active   Magnesium Oxide 420 MG TABS [id number]  Take 1 tablet by mouth daily. [provider]  Active Self  Multiple Vitamin (MULTIVITAMIN) tablet [id number]  Take 1 tablet by mouth daily. [provider]  Active   olmesartan-hydrochlorothiazide (BENICAR HCT) 20-12.5 MG tablet [id number]  Take 1 tablet by mouth daily. Dorothyann Peng, MD  Active   rosuvastatin (CRESTOR) 10 MG  tablet [id number]  TAKE 1 TABLET BY MOUTH EVERY MONDAY, TUESDAY, WEDNESDAY, THURS, AND FRIDAY FOR 20 DAYS Dorothyann Peng, MD  Active   sitaGLIPtin (JANUVIA) 100 MG tablet [id number] Yes Take 1 tablet (100 mg total) by mouth daily. Dorothyann Peng, MD Taking Active   tadalafil (CIALIS) 5 MG tablet [id number]  TAKE 1 TABLET BY MOUTH ONCE A DAY. Dorothyann Peng, MD  Active   UNABLE TO FIND [id number]  Take 1 capsule by mouth daily at 2 am. Med Name: testosterone [provider]  Active               Assessment/Plan:   Diabetes: - Currently uncontrolled but anticipate improvement with increased metformin component of Synjardy. Could pursue Januvia assistance, but would prefer Rybelsus (patient not interested in injections) due to improved benefit.  - Reviewed goal A1c, goal fasting, and goal 2 hour post prandial glucose - Recommend to continue current regimen at this time. Follow blood sugars/A1c after Synjardy XR 12.11/998 mg twice daily is received.  - Recommend to check glucose twice daily, fasting and 2 hour post prandial   Hypertension: - Currently controlled - Recommend to continue current regimen at this time   Hyperlipidemia/ASCVD Risk Reduction: - Currently controlled.  - Recommend to continue current regimen at this time   Follow Up Plan: phone call in 6 weeks  Catie TClearance Coots, PharmD, BCACP, CPP Clinical Pharmacist [facility] [phone]

## 2023-03-23 ENCOUNTER — Other Ambulatory Visit: Payer: Self-pay | Admitting: Pharmacist

## 2023-03-23 NOTE — Progress Notes (Signed)
Care Coordination Call  Received call from patient. He noted he called the [employer] number listed on his approval letter and was told by the representative that there was an issue with his application and additional information was needed. Will collaborate with CPhT team to contact [employer].

## 2023-04-03 NOTE — Progress Notes (Addendum)
Care Coordination Call  Collaborated with pharmacy technician team. Confirmed patient's enrollment. [employer] noted we could call back on Monday for shipping information.   Contacted [employer]. Waited on hold for ~1 hour. Confirmed Synjardy 12.11/998 mg. They note that there is not a prescription on file. Provided verbal prescription. They note the order will arrive in 5-7 business days.   Will collaborate with clinic staff to check if we have samples of Synjardy to give to patient while awaing assistance supply.   Catie Eppie Gibson, PharmD, BCACP, CPP Clinical Pharmacist [facility] [phone]

## 2023-04-05 ENCOUNTER — Encounter: Payer: Self-pay | Admitting: Internal Medicine

## 2023-04-05 ENCOUNTER — Ambulatory Visit: Payer: Medicare PPO | Admitting: Internal Medicine

## 2023-04-05 VITALS — BP 124/78 | HR 78 | Temp 98.4°F | Ht 72.0 in | Wt 229.0 lb

## 2023-04-05 DIAGNOSIS — E7800 Pure hypercholesterolemia, unspecified: Secondary | ICD-10-CM

## 2023-04-05 DIAGNOSIS — E6609 Other obesity due to excess calories: Secondary | ICD-10-CM

## 2023-04-05 DIAGNOSIS — M79662 Pain in left lower leg: Secondary | ICD-10-CM | POA: Diagnosis not present

## 2023-04-05 DIAGNOSIS — N182 Chronic kidney disease, stage 2 (mild): Secondary | ICD-10-CM

## 2023-04-05 DIAGNOSIS — Z6831 Body mass index (BMI) 31.0-31.9, adult: Secondary | ICD-10-CM

## 2023-04-05 DIAGNOSIS — Z23 Encounter for immunization: Secondary | ICD-10-CM | POA: Diagnosis not present

## 2023-04-05 DIAGNOSIS — M791 Myalgia, unspecified site: Secondary | ICD-10-CM

## 2023-04-05 DIAGNOSIS — E1122 Type 2 diabetes mellitus with diabetic chronic kidney disease: Secondary | ICD-10-CM

## 2023-04-05 DIAGNOSIS — I129 Hypertensive chronic kidney disease with stage 1 through stage 4 chronic kidney disease, or unspecified chronic kidney disease: Secondary | ICD-10-CM

## 2023-04-05 DIAGNOSIS — Z794 Long term (current) use of insulin: Secondary | ICD-10-CM

## 2023-04-05 MED ORDER — SYNJARDY 12.5-1000 MG PO TABS: 1.0000 | ORAL_TABLET | Freq: Two times a day (BID) | ORAL | Status: DC

## 2023-04-05 NOTE — Patient Instructions (Addendum)
Rybelsus Take with 4 ounces of water Wait 30 minutes prior to taking other meds/eating breakfast  Synjardy 12.5mg /1000mg  taken twice daily at breakfast/dinner  Type 2 Diabetes Mellitus, Diagnosis, Adult Type 2 diabetes (type 2 diabetes mellitus) is a long-term (chronic) disease. It may happen when there is one or both of these problems: The pancreas does not make enough insulin. The body does not react in a normal way to insulin that it makes. Insulin lets sugars go into cells in your body. If you have type 2 diabetes, sugars cannot get into your cells. Sugars build up in the blood. This causes high blood sugar. What are the causes? The exact cause of this condition is not known. What increases the risk? Having type 2 diabetes in your family. Being overweight or very overweight. Not being active. Your body not reacting in a normal way to the insulin it makes. Having higher than normal blood sugar over time. Having a type of diabetes when you were pregnant. Having a condition that causes small fluid-filled sacs on your ovaries. What are the signs or symptoms? At first, you may have no symptoms. You will get symptoms slowly. They may include: More thirst than normal. More hunger than normal. Needing to pee more than normal. Losing weight without trying. Feeling tired. Feeling weak. Seeing things blurry. Dark patches on your skin. How is this treated? This condition may be treated by a diabetes expert. You may need to: Follow an eating plan made by a food expert (dietitian). Get regular exercise. Find ways to deal with stress. Check blood sugar as often as told. Take medicines. Your doctor will set treatment goals for you. Your blood sugar should be at these levels: Before meals: 80-130 mg/dL (4.0-9.8 mmol/L). After meals: below 180 mg/dL (10 mmol/L). Over the last 2-3 months: less than 7%. Follow these instructions at home: Medicines Take your diabetes medicines or insulin  every day. Take medicines as told to help you prevent other problems caused by this condition. You may need: Aspirin. Medicine to lower cholesterol. Medicine to control blood pressure. Questions to ask your doctor Should I meet with a diabetes educator? What medicines do I need, and when should I take them? What will I need to treat my condition at home? When should I check my blood sugar? Where can I find a support group? Who can I call if I have questions? When is my next doctor visit? General instructions Take over-the-counter and prescription medicines only as told by your doctor. Keep all follow-up visits. Where to find more information For help and guidance and more information about diabetes, please go to: American Diabetes Association (ADA): www.diabetes.org American Association of Diabetes Care and Education Specialists (ADCES): www.diabeteseducator.org International Diabetes Federation (IDF): DCOnly.dk Contact a doctor if: Your blood sugar is at or above 240 mg/dL (11.9 mmol/L) for 2 days in a row. You have been sick for 2 days or more, and you are not getting better. You have had a fever for 2 days or more, and you are not getting better. You have any of these problems for more than 6 hours: You cannot eat or drink. You feel like you may vomit. You vomit. You have watery poop (diarrhea). Get help right away if: Your blood sugar is lower than 54 mg/dL (3 mmol/L). You feel mixed up (confused). You have trouble thinking clearly. You have trouble breathing. You have medium or large ketone levels in your pee. These symptoms may be an emergency. Get help right  away. Call your local emergency services (911 in the [place] Do not wait to see if the symptoms will go away. Do not drive yourself to the hospital. Summary Type 2 diabetes is a long-term disease. Your pancreas may not make enough insulin, or your body may not react in a normal way to insulin that it makes. This  condition is treated with an eating plan, lifestyle changes, and medicines. Your doctor will set treatment goals for you. These will help you keep your blood sugar in a healthy range. Keep all follow-up visits. This information is not intended to replace advice given to you by your health care provider. Make sure you discuss any questions you have with your health care provider. Document Revised: 09/21/2020 Document Reviewed: 09/21/2020 Elsevier Patient Education  2024 [employer].

## 2023-04-05 NOTE — Progress Notes (Unsigned)
I,[name], CMA,acting as a Neurosurgeon for [name], MD.,have documented all relevant documentation on the behalf of [name], MD,as directed by  [name], MD while in the presence of [name], MD.  Subjective:  Patient ID: Andres [name] , male    DOB: [birth year] , 72 y.o.   MRN: [id number]  Chief Complaint  Patient presents with   Diabetes    HPI  He presents for DM/HTN follow-up. He reports compliance with meds. He denies headaches, chest pain and shortness of breath. Patient reports he was sick about 2 months ago and he does feel better but he is still having a lot of sneezing and mucus in his throat. He reports sometimes his voice becomes hoarse.    Diabetes He presents for his follow-up diabetic visit. He has type 2 diabetes mellitus. His disease course has been stable. There are no hypoglycemic associated symptoms. Pertinent negatives for diabetes include no blurred vision, no chest pain, no polydipsia, no polyphagia and no polyuria. There are no hypoglycemic complications. Diabetic complications include nephropathy. Risk factors for coronary artery disease include diabetes mellitus, dyslipidemia, hypertension and male sex. He is following a diabetic diet. His breakfast blood glucose is taken between 8-9 am. His breakfast blood glucose range is generally 90-110 mg/dl. An ACE inhibitor/angiotensin II receptor blocker is being taken. Eye exam is not current.  Hypertension This is a chronic problem. The current episode started more than 1 year ago. The problem has been gradually improving since onset. The problem is controlled. Pertinent negatives include no blurred vision, chest pain, palpitations or shortness of breath. Past treatments include angiotensin blockers and diuretics. The current treatment provides moderate improvement.     Past Medical History:  Diagnosis Date   Diabetes (HCC)    High cholesterol    Hypertension    states under control  with med., has been on med. x 5 yr.   Seasonal allergies    Umbilical hernia 09/2014     Family History  Problem Relation Age of Onset   Healthy Mother    Early death Father      Current Outpatient Medications:    Accu-Chek FastClix Lancets MISC, One tab po qd dx; e11.65, Disp: 100 each, Rfl: 11   ACCU-CHEK GUIDE test strip, Use to check BS qd dx: E11.65, Disp: 100 each, Rfl: 11   Arginine 500 MG CAPS, Take by mouth. Daily, Disp: , Rfl:    aspirin 81 MG tablet, Take 81 mg by mouth daily., Disp: , Rfl:    cholecalciferol (VITAMIN D) 1000 UNITS tablet, Take 4,000 Units by mouth daily., Disp: , Rfl:    Cyanocobalamin (VITAMIN B 12) 250 MCG LOZG, Take 1 tablet by mouth every other day., Disp: , Rfl:    Empagliflozin-metFORMIN HCl ER (SYNJARDY XR) 25-1000 MG TB24, Take 1 tablet by mouth daily., Disp: 30 tablet, Rfl:    Ferrous Sulfate (IRON PO), Take 1 capsule by mouth daily at 2 am., Disp: , Rfl:    fluticasone (CUTIVATE) 0.05 % cream, Apply topically 2 (two) times daily as needed., Disp: 60 g, Rfl: 1   levocetirizine (XYZAL) 5 MG tablet, Take 1 tablet (5 mg total) by mouth every evening., Disp: 90 tablet, Rfl: 2   Magnesium Oxide 420 MG TABS, Take 1 tablet by mouth daily., Disp: , Rfl:    Multiple Vitamin (MULTIVITAMIN) tablet, Take 1 tablet by mouth daily., Disp: , Rfl:    olmesartan-hydrochlorothiazide (BENICAR HCT) 20-12.5 MG tablet,  Take 1 tablet by mouth daily., Disp: 90 tablet, Rfl: 2   rosuvastatin (CRESTOR) 10 MG tablet, TAKE 1 TABLET BY MOUTH EVERY MONDAY, TUESDAY, WEDNESDAY, THURS, AND FRIDAY FOR 20 DAYS, Disp: 90 tablet, Rfl: 1   sitaGLIPtin (JANUVIA) 100 MG tablet, Take 1 tablet (100 mg total) by mouth daily., Disp: 90 tablet, Rfl: 1   tadalafil (CIALIS) 5 MG tablet, TAKE 1 TABLET BY MOUTH ONCE A DAY., Disp: 30 tablet, Rfl: 2   UNABLE TO FIND, Take 1 capsule by mouth daily at 2 am. Med Name: testosterone, Disp: , Rfl:    No Known Allergies   Review of Systems   Constitutional: Negative.   HENT: Negative.    Eyes:  Negative for blurred vision.  Respiratory:  Negative for shortness of breath.   Cardiovascular:  Negative for chest pain and palpitations.  Gastrointestinal: Negative.   Endocrine: Negative.  Negative for polydipsia, polyphagia and polyuria.  Genitourinary: Negative.   Skin: Negative.   Allergic/Immunologic: Negative.   Neurological: Negative.   Hematological: Negative.      Today's Vitals   04/05/23 0909  Temp: 98.4 F (36.9 C)  Weight: 229 lb (103.9 kg)  Height: 6' (1.829 m)   Body mass index is 31.06 kg/m.  Wt Readings from Last 3 Encounters:  04/05/23 229 lb (103.9 kg)  01/25/23 228 lb 6.4 oz (103.6 kg)  12/01/22 225 lb (102.1 kg)     Objective:  Physical Exam      Assessment And Plan:  Type 2 diabetes mellitus with stage 2 chronic kidney disease, with long-term current use of insulin (HCC)  Parenchymal renal hypertension, stage 1 through stage 4 or unspecified chronic kidney disease  Pure hypercholesterolemia     No follow-ups on file.  Patient was given opportunity to ask questions. Patient verbalized understanding of the plan and was able to repeat key elements of the plan. All questions were answered to their satisfaction.  [name], MD  I, [name], MD, have reviewed all documentation for this visit. The documentation on 04/05/23 for the exam, diagnosis, procedures, and orders are all accurate and complete.   IF YOU HAVE BEEN REFERRED TO A SPECIALIST, IT MAY TAKE 1-2 WEEKS TO SCHEDULE/PROCESS THE REFERRAL. IF YOU HAVE NOT HEARD FROM US/SPECIALIST IN TWO WEEKS, PLEASE GIVE [place] A CALL AT [phone].   THE PATIENT IS ENCOURAGED TO PRACTICE SOCIAL DISTANCING DUE TO THE COVID-19 PANDEMIC.

## 2023-04-06 ENCOUNTER — Other Ambulatory Visit: Payer: Self-pay | Admitting: Internal Medicine

## 2023-04-06 DIAGNOSIS — M791 Myalgia, unspecified site: Secondary | ICD-10-CM | POA: Insufficient documentation

## 2023-04-06 DIAGNOSIS — M79662 Pain in left lower leg: Secondary | ICD-10-CM | POA: Insufficient documentation

## 2023-04-06 LAB — CK: Total CK: 128 U/L (ref 41–331)

## 2023-04-06 LAB — D-DIMER, QUANTITATIVE: D-DIMER: 1.75 mg/L FEU — ABNORMAL HIGH (ref 0.00–0.49)

## 2023-04-06 NOTE — Assessment & Plan Note (Signed)
He is encouraged to initially strive for BMI less than 30 to decrease cardiac risk. He is advised to exercise no less than 150 minutes per week.

## 2023-04-06 NOTE — Assessment & Plan Note (Signed)
He is at risk due to [place]. I will check D-dimer and consider lower extremity venous ultrasound.

## 2023-04-06 NOTE — Assessment & Plan Note (Signed)
Chronic, controlled. He will continue with olmesartan/hct 20/12.5mg  daily. He is encouraged to follow low sodium diet.

## 2023-04-06 NOTE — Assessment & Plan Note (Signed)
Chronic, I will check labs at his next visit.  He is now on Synjardy 12.5mg /1000mg   twice daily. He was given samples of Rybelsus to take upon awakening. This will replace the Januvia. He denies family history of thyroid cancer. Advised to take Rybelsus upon awakening and to wait 30 minutes prior to eating breakfast and/or taking other meds. Possible side effects were discussed with the patient. He will f/u in four to six weeks.

## 2023-04-07 ENCOUNTER — Other Ambulatory Visit: Payer: Self-pay

## 2023-04-07 ENCOUNTER — Other Ambulatory Visit: Payer: Self-pay | Admitting: Internal Medicine

## 2023-04-07 ENCOUNTER — Ambulatory Visit (HOSPITAL_COMMUNITY)
Admission: RE | Admit: 2023-04-07 | Discharge: 2023-04-07 | Disposition: A | Discharge status: Home / Self Care | Payer: Medicare PPO | Source: Ambulatory Visit | Attending: Internal Medicine | Admitting: Internal Medicine

## 2023-04-07 ENCOUNTER — Ambulatory Visit: Payer: Self-pay

## 2023-04-07 DIAGNOSIS — M79662 Pain in left lower leg: Secondary | ICD-10-CM

## 2023-04-07 NOTE — Patient Instructions (Signed)
Visit Information  Thank you for taking time to visit with me today. Please don't hesitate to contact me if I can be of assistance to you.   Following are the goals we discussed today:   Goals Addressed             This Visit's Progress    To have calf pain further evaluated       Care Coordination Interventions: Evaluation of current treatment plan related to pain of left calf and patient's adherence to plan as established by provider Educated patient about basic disease process related to DVT, including potential complications and treatment if found to have a DVT Reviewed and discussed with patient his upcoming scheduled [place] scheduled at [facility] for evaluation of DVT scheduled for today at 12:30 PM, determined patient is traveling back from [place] and feels he will arrive in time for his appointment Reviewed and discussed with patient his next scheduled follow up with Dr. Allyne Gee scheduled for 05/24/23 @10 :40 AM        Our next appointment is by telephone on 05/26/23 at 12:00 PM  Please call the care guide team at [phone] if you need to cancel or reschedule your appointment.   If you are experiencing a Mental Health or Behavioral Health Crisis or need someone to talk to, please call 1-800-273-TALK (toll free, 24 hour hotline)  Patient verbalizes understanding of instructions and care plan provided today and agrees to view in MyChart. Active MyChart status and patient understanding of how to access instructions and care plan via MyChart confirmed with patient.     Delsa Sale RN BSN [employer]  [facility], [facility] Nurse Care Coordinator  Direct Dial: [phone] Website: [email or web address]

## 2023-04-07 NOTE — Patient Outreach (Signed)
Care Coordination   Follow Up Visit Note   04/07/2023 Name: CANYON LANGSTON MRN: [id number] DOB: February 23, 1951  Ivan Anchors Rotert is a 72 y.o. year old male who sees Dorothyann Peng, MD for primary care. I spoke with  Jerard L Delcarlo by phone today.  What matters to the patients health and wellness today?  Patient would like to have his left calf pain evaluated to rule out a DVT.     Goals Addressed             This Visit's Progress    To have calf pain further evaluated       Care Coordination Interventions: Evaluation of current treatment plan related to pain of left calf and patient's adherence to plan as established by provider Educated patient about basic disease process related to DVT, including potential complications and treatment if found to have a DVT Reviewed and discussed with patient his upcoming scheduled [place] scheduled at [facility] for evaluation of DVT scheduled for today at 12:30 PM, determined patient is traveling back from [place] and feels he will arrive in time for his appointment Reviewed and discussed with patient his next scheduled follow up with Dr. Allyne Gee scheduled for 05/24/23 @10 :40 AM    Interventions Today    Flowsheet Row Most Recent Value  Chronic Disease   Chronic disease during today's visit Other  [DVT]  General Interventions   General Interventions Discussed/Reviewed General Interventions Discussed, General Interventions Reviewed, Doctor Visits  Doctor Visits Discussed/Reviewed Doctor Visits Discussed, Doctor Visits Reviewed, PCP  Education Interventions   Education Provided Provided Education  Provided Verbal Education On When to see the doctor          SDOH assessments and interventions completed:  No     Care Coordination Interventions:  Yes, provided   Follow up plan: Follow up call scheduled for 05/26/23 @12 :00 PM     Encounter Outcome:  Patient Visit Completed

## 2023-04-13 DIAGNOSIS — R3912 Poor urinary stream: Secondary | ICD-10-CM | POA: Diagnosis not present

## 2023-04-13 DIAGNOSIS — N401 Enlarged prostate with lower urinary tract symptoms: Secondary | ICD-10-CM | POA: Diagnosis not present

## 2023-04-13 DIAGNOSIS — Z125 Encounter for screening for malignant neoplasm of prostate: Secondary | ICD-10-CM | POA: Diagnosis not present

## 2023-04-13 DIAGNOSIS — N5201 Erectile dysfunction due to arterial insufficiency: Secondary | ICD-10-CM | POA: Diagnosis not present

## 2023-05-09 ENCOUNTER — Other Ambulatory Visit: Payer: Medicare PPO

## 2023-05-09 ENCOUNTER — Other Ambulatory Visit (INDEPENDENT_AMBULATORY_CARE_PROVIDER_SITE_OTHER): Payer: Medicare PPO | Admitting: Pharmacist

## 2023-05-09 ENCOUNTER — Encounter: Payer: Self-pay | Admitting: Pharmacist

## 2023-05-09 DIAGNOSIS — N182 Chronic kidney disease, stage 2 (mild): Secondary | ICD-10-CM

## 2023-05-09 DIAGNOSIS — E1122 Type 2 diabetes mellitus with diabetic chronic kidney disease: Secondary | ICD-10-CM

## 2023-05-09 DIAGNOSIS — Z794 Long term (current) use of insulin: Secondary | ICD-10-CM

## 2023-05-09 MED ORDER — FREESTYLE LIBRE 3 PLUS SENSOR MISC: 3 refills | Status: AC

## 2023-05-09 MED ORDER — ROSUVASTATIN CALCIUM 10 MG PO TABS: ORAL_TABLET | ORAL | 3 refills | Status: DC

## 2023-05-09 NOTE — Progress Notes (Signed)
error 

## 2023-05-09 NOTE — Progress Notes (Signed)
05/09/2023 Name: Andres [name] MRN: [id number] DOB: 08-Oct-1950  Chief Complaint  Patient presents with   Medication Management   Diabetes    Andres [name] is a 72 y.o. year old male who presented for a telephone visit.   They were referred to the pharmacist by their PCP for assistance in managing diabetes.    Subjective:  Care Team: Primary Care Provider: Dorothyann Peng, MD ; Next Scheduled Visit: 05/24/23  Medication Access/Adherence  Current Pharmacy:  [facility] - [place], [place] - [address] [place] [address] Phone: [phone] Fax: [phone]  [facility] - [place], [place] - [address] [place] [address] Phone: [phone] Fax: [phone]  [facility] [address], [place] - [address] [place] [address] [place] [address] [place] [address] Phone: [phone] Fax: [phone]   Patient reports affordability concerns with their medications: Yes  Patient reports access/transportation concerns to their pharmacy: No  Patient reports adherence concerns with their medications:  No     Diabetes:  Current medications: Synjardy 12.11/998 mg twice daily, Rybelsus 3 mg daily per samples   Current glucose readings: has not been checking, notes he needs to get a new battery  Denies any GI upset, nausea, constipation since starting Rybelsus. Notes he is almost out of samples.   Medication access: approved for Synjardy through 07/11/23.  Hypertension:  Current medications: olmesartan/hydrochlorothiazide 20/12.5 mg daily  Patient has an automated, upper arm home BP cuff but is not checking, notes he needs batteries   Hyperlipidemia/ASCVD Risk Reduction  Current lipid lowering medications: rosuvastatin 10 mg 5 days weekly. Denies any muscle aches or pains with this medication  Antiplatelet regimen: aspirin 81 mg daily  Objective:  Lab Results  Component Value Date   HGBA1C 8.0 (H)  01/25/2023    Lab Results  Component Value Date   CREATININE 1.42 (H) 01/25/2023   BUN 19 01/25/2023   NA 137 01/25/2023   K 4.9 01/25/2023   CL 97 01/25/2023   CO2 23 01/25/2023    Lab Results  Component Value Date   CHOL 119 11/28/2022   HDL 31 (L) 11/28/2022   LDLCALC 62 11/28/2022   TRIG 147 11/28/2022   CHOLHDL 3.8 11/28/2022    Medications Reviewed Today     Reviewed by Alden Hipp, RPH-CPP (Pharmacist) on 05/09/23 at 1554  Med List Status: <None>   Medication Order Taking? Sig Documenting Provider Last Dose Status Informant  Accu-Chek FastClix Lancets MISC [id number] No One tab po qd dx; e11.65 Dorothyann Peng, MD Taking Active   ACCU-CHEK GUIDE test strip [id number] No Use to check BS qd dx: E11.65 Dorothyann Peng, MD Taking Active   Arginine 500 MG CAPS [id number] No Take by mouth. Daily [provider] Taking Active Self  aspirin 81 MG tablet [id number] No Take 81 mg by mouth daily. [provider] Taking Active   cholecalciferol (VITAMIN D) 1000 UNITS tablet [id number] No Take 4,000 Units by mouth daily. [provider] Taking Active   Continuous Glucose Sensor (FREESTYLE LIBRE 3 PLUS SENSOR) MISC [id number] No Change sensor every 15 days. Dorothyann Peng, MD Taking Active   Cyanocobalamin (VITAMIN B 12) 250 MCG LOZG [id number] No Take 1 tablet by mouth every other day. [provider] Taking Active   Empagliflozin-metFORMIN HCl (SYNJARDY) 12.11-998 MG TABS [id number] No Take 1 tablet by mouth 2 (two) times daily. Dorothyann Peng, MD Taking Active   fluticasone (CUTIVATE) 0.05 % cream  [id number] No Apply topically 2 (two) times daily as needed. Dorothyann Peng, MD Taking Active   levocetirizine (XYZAL) 5 MG tablet [id number] No Take 1 tablet (5 mg total) by mouth every evening. Dorothyann Peng, MD Taking Active   Multiple Vitamin (MULTIVITAMIN) tablet [id number] No Take 1 tablet by mouth daily. [provider] Taking Active    olmesartan-hydrochlorothiazide (BENICAR HCT) 20-12.5 MG tablet [id number] No Take 1 tablet by mouth daily. Dorothyann Peng, MD Taking Active   rosuvastatin (CRESTOR) 10 MG tablet [id number]  TAKE 1 TABLET BY MOUTH EVERY MONDAY, TUESDAY, WEDNESDAY, THURS, AND Drue Flirt, MD  Active   Semaglutide (RYBELSUS) 3 MG TABS [id number] No Take 3 mg by mouth daily. [provider] Taking Active   tadalafil (CIALIS) 5 MG tablet [id number] No TAKE 1 TABLET BY MOUTH ONCE A DAY. Dorothyann Peng, MD Taking Active   UNABLE TO FIND [id number]  Take 1 capsule by mouth daily at 2 am. Med Name: testosterone [provider]  Active               Assessment/Plan:   Diabetes: - Currently uncontrolled per last A1c but anticipate improvement with addition of Rybelsus.  - Reviewed goal A1c, goal fasting, and goal 2 hour post prandial glucose - Recommend to continue Synjardy and Rybelsus. Completed patient and provider portion of Novo Nordisk application for Rybelsus, will pass to CPhT team for follow up.  - Patient inquired about CGM. Script for Valders 3 CGM sent to [place], it is not covered under his Part D. Medicare will not cover under Part B as he is not on insulin and not experiencing hypoglycemia. Will continue with periodic blood sugar checks.  - Will collect patient portion of Synjardy reapplication when seen by Dr. Allyne Gee in a few weeks.    Hypertension: - Currently controlled - Reviewed long term cardiovascular and renal outcomes of uncontrolled blood pressure - Reviewed appropriate blood pressure monitoring technique and reviewed goal blood pressure. Recommended to check home blood pressure and heart rate periodically. Patient agrees to pick up batteries.  - Recommend to continue current regimen at this time  Hyperlipidemia/ASCVD Risk Reduction: - Currently controlled. Discussed option to increase atorvastatin to daily administration to facilitate easier patient  administration. Patient declines.  - Recommend to continue current regimen at this time   Follow Up Plan: phone call in ~ 6 weeks  Catie Eppie Gibson, PharmD, BCACP, CPP Clinical Pharmacist [facility] [phone]

## 2023-05-24 ENCOUNTER — Ambulatory Visit (INDEPENDENT_AMBULATORY_CARE_PROVIDER_SITE_OTHER): Payer: Medicare PPO | Admitting: Internal Medicine

## 2023-05-24 ENCOUNTER — Other Ambulatory Visit: Payer: Self-pay | Admitting: Pharmacist

## 2023-05-24 ENCOUNTER — Encounter: Payer: Self-pay | Admitting: Internal Medicine

## 2023-05-24 VITALS — BP 104/68 | HR 66 | Temp 98.0°F | Ht 72.0 in | Wt 225.6 lb

## 2023-05-24 DIAGNOSIS — Z794 Long term (current) use of insulin: Secondary | ICD-10-CM

## 2023-05-24 DIAGNOSIS — E6609 Other obesity due to excess calories: Secondary | ICD-10-CM | POA: Diagnosis not present

## 2023-05-24 DIAGNOSIS — Z6830 Body mass index (BMI) 30.0-30.9, adult: Secondary | ICD-10-CM

## 2023-05-24 DIAGNOSIS — E66811 Obesity, class 1: Secondary | ICD-10-CM | POA: Diagnosis not present

## 2023-05-24 DIAGNOSIS — I129 Hypertensive chronic kidney disease with stage 1 through stage 4 chronic kidney disease, or unspecified chronic kidney disease: Secondary | ICD-10-CM | POA: Diagnosis not present

## 2023-05-24 DIAGNOSIS — E1122 Type 2 diabetes mellitus with diabetic chronic kidney disease: Secondary | ICD-10-CM

## 2023-05-24 DIAGNOSIS — N182 Chronic kidney disease, stage 2 (mild): Secondary | ICD-10-CM

## 2023-05-24 MED ORDER — SYNJARDY 12.5-1000 MG PO TABS: 1.0000 | ORAL_TABLET | Freq: Two times a day (BID) | ORAL | 3 refills | Status: DC

## 2023-05-24 NOTE — Patient Instructions (Signed)

## 2023-05-24 NOTE — Progress Notes (Signed)
Pharmacy Medication Assistance Program Note    05/24/2023  Patient ID: Andres [name], male   DOB: 11-25-1950, 72 y.o.   MRN: [id number]     05/24/2023  Outreach Medication One  Initial Outreach Date (Medication One) 05/24/2023  Manufacturer Medication One [employer] Drugs Rybelsus  Dose of Rybelsus 7 mg  Type of Assistance Manufacturer Assistance  Date Application Sent to Patient 05/24/2023  Application Items Requested Application  Date Application Sent to Prescriber 05/24/2023  Name of Prescriber Dorothyann Peng  Date Application Received From Patient 05/24/2023  Application Items Received From Patient Application  Date Application Received From Provider 05/24/2023  Date Application Submitted to Manufacturer 05/24/2023  Method Application Sent to Manufacturer Online          05/24/2023  Outreach Medication Two  Initial Outreach Date (Medication Two) 05/24/2023  Manufacturer Medication Two Boehringer Ingelheim  [employer] Drugs Synjardy  Dose of Synjardy 12.11/998  Type of Radiographer, therapeutic Assistance  Date Application Sent to Patient 05/24/2023  Application Items Requested Application  Date Application Sent to Prescriber 05/24/2023  Name of Prescriber Dorothyann Peng  Date Application Received From Patient 05/24/2023  Application Items Received From Patient Application  Date Application Received From Provider 05/24/2023  Method Application Sent to Manufacturer Fax  Date Application Submitted to Manufacturer 05/24/2023       Novo Nordisk 2025 reapplication previously submitted 10/29. 2024 application submitted online today. 2025 BI Cares application faxed today. Order sent to [facility].   Catie Eppie Gibson, PharmD, BCACP, CPP Clinical Pharmacist [facility] [phone]

## 2023-05-24 NOTE — Progress Notes (Signed)
I,Andres [name], CMA,acting as a Neurosurgeon for Andres Aliment, MD.,have documented all relevant documentation on the behalf of Andres Aliment, MD,as directed by  Andres Aliment, MD while in the presence of Andres Aliment, MD.  Subjective:  Patient ID: Andres [name] , male    DOB: 18-Oct-1950 , 72 y.o.   MRN: [id number]  Chief Complaint  Patient presents with   Diabetes   Hypertension    HPI  He presents for DM/HTN follow-up. He is now on [facility]. He reports compliance with meds. He denies headaches, chest pain and shortness of breath. He denies having any specific questions or concerns.   Accucheck- 121  Diabetes He presents for his follow-up diabetic visit. He has type 2 diabetes mellitus. His disease course has been stable. There are no hypoglycemic associated symptoms. Pertinent negatives for diabetes include no blurred vision, no chest pain, no polydipsia, no polyphagia and no polyuria. There are no hypoglycemic complications. Diabetic complications include nephropathy. Risk factors for coronary artery disease include diabetes mellitus, dyslipidemia, hypertension and male sex. He is following a diabetic diet. His breakfast blood glucose is taken between 8-9 am. His breakfast blood glucose range is generally 90-110 mg/dl. An ACE inhibitor/angiotensin II receptor blocker is being taken. Eye exam is not current.  Hypertension This is a chronic problem. The current episode started more than 1 year ago. The problem has been gradually improving since onset. The problem is controlled. Pertinent negatives include no blurred vision, chest pain, palpitations or shortness of breath. Past treatments include angiotensin blockers and diuretics. The current treatment provides moderate improvement.     Past Medical History:  Diagnosis Date   Diabetes (HCC)    High cholesterol    Hypertension    states under control with med., has been on med. x 5 yr.   Seasonal allergies    Umbilical hernia  09/2014     Family History  Problem Relation Age of Onset   Healthy Mother    Early death Father      Current Outpatient Medications:    Accu-Chek FastClix Lancets MISC, One tab po qd dx; e11.65, Disp: 100 each, Rfl: 11   ACCU-CHEK GUIDE test strip, Use to check BS qd dx: E11.65, Disp: 100 each, Rfl: 11   Arginine 500 MG CAPS, Take by mouth. Daily, Disp: , Rfl:    aspirin 81 MG tablet, Take 81 mg by mouth daily., Disp: , Rfl:    cholecalciferol (VITAMIN D) 1000 UNITS tablet, Take 4,000 Units by mouth daily., Disp: , Rfl:    Continuous Glucose Sensor (FREESTYLE LIBRE 3 PLUS SENSOR) MISC, Change sensor every 15 days., Disp: 6 each, Rfl: 3   Cyanocobalamin (VITAMIN B 12) 250 MCG LOZG, Take 1 tablet by mouth every other day., Disp: , Rfl:    Empagliflozin-metFORMIN HCl (SYNJARDY) 12.11-998 MG TABS, Take 1 tablet by mouth 2 (two) times daily., Disp: 180 tablet, Rfl: 3   fluticasone (CUTIVATE) 0.05 % cream, Apply topically 2 (two) times daily as needed., Disp: 60 g, Rfl: 1   levocetirizine (XYZAL) 5 MG tablet, Take 1 tablet (5 mg total) by mouth every evening., Disp: 90 tablet, Rfl: 2   Multiple Vitamin (MULTIVITAMIN) tablet, Take 1 tablet by mouth daily., Disp: , Rfl:    olmesartan-hydrochlorothiazide (BENICAR HCT) 20-12.5 MG tablet, Take 1 tablet by mouth once daily, Disp: 90 tablet, Rfl: 0   rosuvastatin (CRESTOR) 10 MG tablet, TAKE 1 TABLET BY MOUTH EVERY MONDAY, TUESDAY, WEDNESDAY, THURS, AND FRIDAY,  Disp: 64 tablet, Rfl: 3   Semaglutide (RYBELSUS) 3 MG TABS, Take 3 mg by mouth daily., Disp: , Rfl:    tadalafil (CIALIS) 5 MG tablet, TAKE 1 TABLET BY MOUTH ONCE A DAY., Disp: 30 tablet, Rfl: 2   UNABLE TO FIND, Take 1 capsule by mouth daily at 2 am. Med Name: testosterone, Disp: , Rfl:    No Known Allergies   Review of Systems  Constitutional: Negative.   HENT: Negative.    Eyes:  Negative for blurred vision.  Respiratory: Negative.  Negative for shortness of breath.   Cardiovascular:  Negative.  Negative for chest pain and palpitations.  Gastrointestinal: Negative.   Endocrine: Negative for polydipsia, polyphagia and polyuria.  Skin: Negative.   Allergic/Immunologic: Negative.   Neurological: Negative.   Hematological: Negative.      Today's Vitals   05/24/23 1053  BP: 104/68  Pulse: 66  Temp: 98 F (36.7 C)  SpO2: 98%  Weight: 225 lb 9.6 oz (102.3 kg)  Height: 6' (1.829 m)   Body mass index is 30.6 kg/m.  Wt Readings from Last 3 Encounters:  05/24/23 225 lb 9.6 oz (102.3 kg)  04/05/23 229 lb (103.9 kg)  01/25/23 228 lb 6.4 oz (103.6 kg)     Objective:  Physical Exam Vitals and nursing note reviewed.  Constitutional:      Appearance: Normal appearance.  HENT:     Head: Normocephalic and atraumatic.  Eyes:     Extraocular Movements: Extraocular movements intact.  Cardiovascular:     Rate and Rhythm: Normal rate and regular rhythm.     Heart sounds: Normal heart sounds.  Pulmonary:     Effort: Pulmonary effort is normal.     Breath sounds: Normal breath sounds.  Musculoskeletal:     Cervical back: Normal range of motion.  Skin:    General: Skin is warm.  Neurological:     General: No focal deficit present.     Mental Status: He is alert.  Psychiatric:        Mood and Affect: Mood normal.       Assessment And Plan:  Type 2 diabetes mellitus with stage 2 chronic kidney disease, with long-term current use of insulin (HCC) Assessment & Plan: Chornic, he is now on Synjardy 12.5mg /1000mg  twice daily and Rybelsus 3mg  daily. CCM Pharmacy input is appreciated. He will f/u in four months for re-evaluation.   Orders: -     CMP14+EGFR -     Hemoglobin A1c  Hypertensive nephropathy Assessment & Plan: Chronic, well controlled. He will continue with olmesartan/hct 20/12.5mg  daily. He is encouraged to follow low sodium diet.   Orders: -     CMP14+EGFR  Class 1 obesity due to excess calories with serious comorbidity and body mass index (BMI) of  30.0 to 30.9 in adult Assessment & Plan: He is encouraged to initially strive for BMI less than 30 to decrease cardiac risk. He is advised to exercise no less than 150 minutes per week.     She is encouraged to strive for BMI less than 30 to decrease cardiac risk. Advised to aim for at least 150 minutes of exercise per week.    Return in 3 months (on 08/24/2023), or dm check.  Patient was given opportunity to ask questions. Patient verbalized understanding of the plan and was able to repeat key elements of the plan. All questions were answered to their satisfaction.    I, Andres Aliment, MD, have reviewed all documentation for this  visit. The documentation on 05/24/23 for the exam, diagnosis, procedures, and orders are all accurate and complete.   IF YOU HAVE BEEN REFERRED TO A SPECIALIST, IT MAY TAKE 1-2 WEEKS TO SCHEDULE/PROCESS THE REFERRAL. IF YOU HAVE NOT HEARD FROM US/SPECIALIST IN TWO WEEKS, PLEASE GIVE [place] A CALL AT [phone].   THE PATIENT IS ENCOURAGED TO PRACTICE SOCIAL DISTANCING DUE TO THE COVID-19 PANDEMIC.

## 2023-05-25 ENCOUNTER — Other Ambulatory Visit: Payer: Self-pay | Admitting: Internal Medicine

## 2023-05-25 DIAGNOSIS — I129 Hypertensive chronic kidney disease with stage 1 through stage 4 chronic kidney disease, or unspecified chronic kidney disease: Secondary | ICD-10-CM

## 2023-05-25 LAB — CMP14+EGFR
ALT: 16 [IU]/L (ref 0–44)
AST: 22 [IU]/L (ref 0–40)
Albumin: 4.3 g/dL (ref 3.9–4.9)
Alkaline Phosphatase: 59 [IU]/L (ref 44–121)
BUN/Creatinine Ratio: 14 (ref 10–24)
BUN: 17 mg/dL (ref 8–27)
Bilirubin Total: 0.3 mg/dL (ref 0.0–1.2)
CO2: 20 mmol/L (ref 20–29)
Calcium: 9.5 mg/dL (ref 8.6–10.2)
Chloride: 97 mmol/L (ref 96–106)
Creatinine, Ser: 1.25 mg/dL (ref 0.76–1.27)
Globulin, Total: 3.2 g/dL (ref 1.5–4.5)
Glucose: 106 mg/dL — ABNORMAL HIGH (ref 70–99)
Potassium: 4.5 mmol/L (ref 3.5–5.2)
Sodium: 137 mmol/L (ref 134–144)
Total Protein: 7.5 g/dL (ref 6.0–8.5)
eGFR: 62 mL/min/{1.73_m2} (ref >59)

## 2023-05-25 LAB — HEMOGLOBIN A1C
Est. average glucose Bld gHb Est-mCnc: 171 mg/dL
Hgb A1c MFr Bld: 7.6 % — ABNORMAL HIGH (ref 4.8–5.6)

## 2023-05-26 ENCOUNTER — Ambulatory Visit: Payer: Self-pay

## 2023-05-26 NOTE — Patient Instructions (Signed)
Visit Information  Thank you for taking time to visit with me today. Please don't hesitate to contact me if I can be of assistance to you.   Following are the goals we discussed today:   Goals Addressed               This Visit's Progress     Patient Stated     To lower A1c<6.5 % (pt-stated)   On track     Care Coordination Interventions: Provided education to patient about basic DM disease process Reviewed medications with patient and discussed importance of medication adherence Review of patient status, including review of consultants reports, relevant laboratory and other test results, and medications completed Counseled on Diabetic diet, my plate method, portion control, 150 minutes of moderate intensity exercise/week Positive reinforcement given to patient for making efforts to improve his diabetes Reviewed patient's BMI, educated on healthy weight verses current BMI showing obesity, educated patient regarding increased health risks with increased BMI Assessed patient's understanding of A1c goal: <7.0 % Lab Results  Component Value Date   HGBA1C 7.6 (H) 05/24/2023          Our next appointment is by telephone on 09/07/23 at 1:00 PM  Please call the care guide team at [phone] if you need to cancel or reschedule your appointment.   If you are experiencing a Mental Health or Behavioral Health Crisis or need someone to talk to, please call 1-800-273-TALK (toll free, 24 hour hotline)  Patient verbalizes understanding of instructions and care plan provided today and agrees to view in MyChart. Active MyChart status and patient understanding of how to access instructions and care plan via MyChart confirmed with patient.     Delsa Sale RN BSN CCM [place]  [facility], [facility] Nurse Care Coordinator  Direct Dial: [phone] Website: [email or web address]

## 2023-05-26 NOTE — Patient Outreach (Signed)
  Care Coordination   Follow Up Visit Note   05/26/2023 Name: Andres [name] MRN: [id number] DOB: 1950/11/08  Andres [name] is a 72 y.o. year old male who sees Andres Peng, MD for primary care. I spoke with  Andres [name] by phone today.  What matters to the patients health and wellness today?  Patient would like to exercise more routinely.     Goals Addressed               This Visit's Progress     Patient Stated     To lower A1c<6.5 % (pt-stated)   On track     Care Coordination Interventions: Provided education to patient about basic DM disease process Reviewed medications with patient and discussed importance of medication adherence Review of patient status, including review of consultants reports, relevant laboratory and other test results, and medications completed Counseled on Diabetic diet, my plate method, portion control, 150 minutes of moderate intensity exercise/week Positive reinforcement given to patient for making efforts to improve his diabetes Reviewed patient's BMI, educated on healthy weight verses current BMI showing obesity, educated patient regarding increased health risks with increased BMI Assessed patient's understanding of A1c goal: <7.0 % Lab Results  Component Value Date   HGBA1C 7.6 (H) 05/24/2023          Interventions Today    Flowsheet Row Most Recent Value  Chronic Disease   Chronic disease during today's visit Diabetes, Other  [Obesity]  General Interventions   General Interventions Discussed/Reviewed General Interventions Discussed, General Interventions Reviewed, Labs, Doctor Visits  Doctor Visits Discussed/Reviewed Doctor Visits Discussed, Doctor Visits Reviewed, PCP  Exercise Interventions   Exercise Discussed/Reviewed Physical Activity, Exercise Reviewed, Exercise Discussed  Physical Activity Discussed/Reviewed Physical Activity Reviewed, Physical Activity Discussed  Education Interventions   Education Provided Provided  Education, Provided Printed Education  Provided Verbal Education On Nutrition, Labs, Exercise, Medication, When to see the doctor, Eye Care, Foot Care  Labs Reviewed Kidney Function, Hgb A1c  Nutrition Interventions   Nutrition Discussed/Reviewed Nutrition Discussed, Nutrition Reviewed, Portion sizes  Pharmacy Interventions   Pharmacy Dicussed/Reviewed Pharmacy Topics Discussed, Pharmacy Topics Reviewed          SDOH assessments and interventions completed:  No     Care Coordination Interventions:  Yes, provided   Follow up plan: Follow up call scheduled for 09/07/23 @1 :00 PM    Encounter Outcome:  Patient Visit Completed

## 2023-05-31 ENCOUNTER — Other Ambulatory Visit (HOSPITAL_COMMUNITY): Payer: Self-pay

## 2023-05-31 ENCOUNTER — Telehealth: Payer: Self-pay

## 2023-05-31 NOTE — Telephone Encounter (Signed)
Received fax from [employer] stating that they were unable to verify pt's insurance information, filled out form and faxed to [employer] with copy of front and back of pt's insurance card.

## 2023-06-03 NOTE — Assessment & Plan Note (Signed)
He is encouraged to initially strive for BMI less than 30 to decrease cardiac risk. He is advised to exercise no less than 150 minutes per week.

## 2023-06-03 NOTE — Assessment & Plan Note (Signed)
Chronic, well controlled. He will continue with olmesartan/hct 20/12.5mg  daily. He is encouraged to follow low sodium diet.

## 2023-06-03 NOTE — Assessment & Plan Note (Signed)
Chornic, he is now on Synjardy 12.5mg /1000mg  twice daily and Rybelsus 3mg  daily. CCM Pharmacy input is appreciated. He will f/u in four months for re-evaluation.

## 2023-06-05 NOTE — Telephone Encounter (Signed)
PAP: Patient assistance application for Rybelsus has been approved by PAP Companies: Novo[employer]ord[employer] from 06/02/2023 to 07/11/2023. Medication should be delivered to PAP Delivery: Provider's office For further shipping updates, please contact [employer] at [phone] Pt ID is: [id number]

## 2023-06-16 ENCOUNTER — Other Ambulatory Visit: Payer: Self-pay | Admitting: Pharmacist

## 2023-06-16 NOTE — Progress Notes (Signed)
Pharmacy Medication Assistance Program Note    06/16/2023  Patient ID: Andres [name], male   DOB: 1951-07-11, 72 y.o.   MRN: [id number]     05/24/2023 06/16/2023  Outreach Medication One  Initial Outreach Date (Medication One) 05/24/2023   Manufacturer Medication One [employer] Drugs Rybelsus   Dose of Rybelsus 7 mg   Type of Forensic scientist Assistance  Date Application Sent to Patient 05/24/2023 06/16/2023  Application Items Requested Application Application  Date Application Sent to Prescriber 05/24/2023 06/16/2023  Name of Prescriber Robyn Gillian Shields  Date Application Received From Patient 05/24/2023 06/16/2023  Application Items Received From Patient Application   Date Application Received From Provider 05/24/2023 06/16/2023  Date Application Submitted to Manufacturer 05/24/2023 06/16/2023  Method Application Sent to Manufacturer Online Online       Catie Eppie Gibson, PharmD, BCACP, CPP Clinical Pharmacist [facility] [phone]

## 2023-06-20 ENCOUNTER — Telehealth: Payer: Self-pay

## 2023-06-20 NOTE — Telephone Encounter (Signed)
Patient was called and notified that 2 boxes of Rybelsus 7mg  was ready to pick up- Patient assistance.

## 2023-06-23 ENCOUNTER — Telehealth: Payer: Self-pay

## 2023-06-23 NOTE — Telephone Encounter (Signed)
Patient picked up patient assistance for Rybelsus 7MG  on 06/22/23. Bottle of 30 tablets. Lot number : [id number].

## 2023-06-26 NOTE — Progress Notes (Signed)
Pharmacy Medication Assistance Program Note    06/26/2023  Patient ID: Andres [name], male  DOB: 09/21/1950, 72 y.o.  MRN:  [id number]     05/24/2023 06/26/2023  Outreach Medication Two  Initial Outreach Date (Medication Two) 05/24/2023   Manufacturer Medication Two [employer]gel[employer] Boehringer Ingelheim  [employer] Drugs Synjardy Synjardy  Dose of Synjardy 12.11/998 12.11/998  Type of Forensic scientist Assistance  Date Application Sent to Patient 05/24/2023   Application Items Requested Application   Date Application Sent to Prescriber 05/24/2023   Name of Prescriber Dorothyann Peng   Date Application Received From Patient 05/24/2023   Application Items Received From Patient Application   Date Application Received From Provider 05/24/2023 05/24/2023  Method Application Sent to Manufacturer Fax Fax  Date Application Submitted to Manufacturer 05/24/2023 05/24/2023  Patient Assistance Determination  Approved  Approval Start Date  07/12/2023  Patient Notification Method  MyChart    Approved for Jardiance.   Catie Eppie Gibson, PharmD, BCACP, CPP Clinical Pharmacist [facility] [phone]

## 2023-06-26 NOTE — Addendum Note (Signed)
Addended by: Nilda Simmer T on: 06/26/2023 12:39 PM   Modules accepted: Orders

## 2023-06-26 NOTE — Progress Notes (Signed)
Received a fax from [employer] that they could not verify patient's insurance.  Faxed completed insurance verification and copy of insurance card to [employer].

## 2023-07-10 DIAGNOSIS — E119 Type 2 diabetes mellitus without complications: Secondary | ICD-10-CM | POA: Diagnosis not present

## 2023-07-10 LAB — HM DIABETES EYE EXAM

## 2023-09-05 ENCOUNTER — Ambulatory Visit (INDEPENDENT_AMBULATORY_CARE_PROVIDER_SITE_OTHER): Payer: Medicare HMO | Admitting: Internal Medicine

## 2023-09-05 ENCOUNTER — Encounter: Payer: Self-pay | Admitting: Internal Medicine

## 2023-09-05 VITALS — BP 110/78 | HR 64 | Temp 98.1°F | Ht 72.0 in | Wt 224.2 lb

## 2023-09-05 DIAGNOSIS — Z6830 Body mass index (BMI) 30.0-30.9, adult: Secondary | ICD-10-CM

## 2023-09-05 DIAGNOSIS — Z794 Long term (current) use of insulin: Secondary | ICD-10-CM

## 2023-09-05 DIAGNOSIS — E7800 Pure hypercholesterolemia, unspecified: Secondary | ICD-10-CM

## 2023-09-05 DIAGNOSIS — E6609 Other obesity due to excess calories: Secondary | ICD-10-CM

## 2023-09-05 DIAGNOSIS — I129 Hypertensive chronic kidney disease with stage 1 through stage 4 chronic kidney disease, or unspecified chronic kidney disease: Secondary | ICD-10-CM | POA: Diagnosis not present

## 2023-09-05 DIAGNOSIS — E1122 Type 2 diabetes mellitus with diabetic chronic kidney disease: Secondary | ICD-10-CM

## 2023-09-05 DIAGNOSIS — E66811 Obesity, class 1: Secondary | ICD-10-CM | POA: Diagnosis not present

## 2023-09-05 DIAGNOSIS — N182 Chronic kidney disease, stage 2 (mild): Secondary | ICD-10-CM

## 2023-09-05 NOTE — Assessment & Plan Note (Signed)
 Chronic, well controlled. He will continue with olmesartan/hct 20/12.5mg  daily. He is encouraged to follow low sodium diet.

## 2023-09-05 NOTE — Assessment & Plan Note (Signed)
 Chronic,LDL goal < 70.  He will c/w rosuvastatin 10mg  daily.  Lab Results  Component Value Date   LDLCALC 62 11/28/2022

## 2023-09-05 NOTE — Assessment & Plan Note (Addendum)
 Chronic, he is now on Synjardy 25mg /1000mg  XR daily and Rybelsus 3mg  daily. CCM Pharmacy input is appreciated. I reassured him that I work very closely with the Pharmacy team and I approve all medication changes. I advised him that he should take TWO 3mg  Rybelsus tablets until he runs out, and then transition to the 7mg  daily.  He did not increase Rybelsus to 7mg  as d/w Catie because he states he did not have a clear understanding on why she was changing the dose.  Additionally, pt advised that Synjardy dose was changed to optimize the metformin dose since his blood sugar/A1c had been elevated. I will check labs as below, and adjust meds as needed.  All questions were answered to his satisfaction.   He will f/u in four months for re-evaluation.

## 2023-09-05 NOTE — Patient Instructions (Signed)

## 2023-09-05 NOTE — Progress Notes (Signed)
 I,Andres [name], CMA,acting as a Neurosurgeon for Andres Aliment, MD.,have documented all relevant documentation on the behalf of Andres Aliment, MD,as directed by  Andres Aliment, MD while in the presence of Andres Aliment, MD.  Subjective:  Patient ID: Andres [name] , male    DOB: 10-Oct-1950 , 73 y.o.   MRN: [id number]  Chief Complaint  Patient presents with   Diabetes   Hypertension   Hyperlipidemia    HPI  He presents for DM/HTN follow-up. He reports compliance with meds. He denies having any headaches, chest pain and shortness of breath.  He is currently taking Rybelsus 3mg . He is not sure why he received Rybelsus 7mg  from pharmacist. He wants to get clear instruction on which dose he should take. He did not want to begin taking 7mg  dose without an okay from his doctor.   Letter sent to [facility] for DM eye exam report.   Diabetes He presents for his follow-up diabetic visit. He has type 2 diabetes mellitus. His disease course has been stable. There are no hypoglycemic associated symptoms. Pertinent negatives for diabetes include no blurred vision, no chest pain, no polydipsia, no polyphagia and no polyuria. There are no hypoglycemic complications. Diabetic complications include nephropathy. Risk factors for coronary artery disease include diabetes mellitus, dyslipidemia, hypertension and male sex. He is following a diabetic diet. His breakfast blood glucose is taken between 8-9 am. His breakfast blood glucose range is generally 90-110 mg/dl. An ACE inhibitor/angiotensin II receptor blocker is being taken. Eye exam is not current.  Hypertension This is a chronic problem. The current episode started more than 1 year ago. The problem has been gradually improving since onset. The problem is controlled. Pertinent negatives include no blurred vision, chest pain, palpitations or shortness of breath. Past treatments include angiotensin blockers and diuretics. The current treatment provides  moderate improvement.     Past Medical History:  Diagnosis Date   Diabetes (HCC)    High cholesterol    Hypertension    states under control with med., has been on med. x 5 yr.   Seasonal allergies    Umbilical hernia 09/2014     Family History  Problem Relation Age of Onset   Healthy Mother    Early death Father      Current Outpatient Medications:    Accu-Chek FastClix Lancets MISC, One tab po qd dx; e11.65, Disp: 100 each, Rfl: 11   ACCU-CHEK GUIDE test strip, Use to check BS qd dx: E11.65, Disp: 100 each, Rfl: 11   Arginine 500 MG CAPS, Take by mouth. Daily, Disp: , Rfl:    aspirin 81 MG tablet, Take 81 mg by mouth daily., Disp: , Rfl:    cholecalciferol (VITAMIN D) 1000 UNITS tablet, Take 4,000 Units by mouth daily., Disp: , Rfl:    Continuous Glucose Sensor (FREESTYLE LIBRE 3 PLUS SENSOR) MISC, Change sensor every 15 days., Disp: 6 each, Rfl: 3   Cyanocobalamin (VITAMIN B 12) 250 MCG LOZG, Take 1 tablet by mouth every other day., Disp: , Rfl:    Empagliflozin-metFORMIN HCl (SYNJARDY) 12.11-998 MG TABS, Take 1 tablet by mouth 2 (two) times daily., Disp: 180 tablet, Rfl: 3   fluticasone (CUTIVATE) 0.05 % cream, Apply topically 2 (two) times daily as needed., Disp: 60 g, Rfl: 1   levocetirizine (XYZAL) 5 MG tablet, Take 1 tablet (5 mg total) by mouth every evening., Disp: 90 tablet, Rfl: 2   Multiple Vitamin (MULTIVITAMIN) tablet, Take 1  tablet by mouth daily., Disp: , Rfl:    olmesartan-hydrochlorothiazide (BENICAR HCT) 20-12.5 MG tablet, Take 1 tablet by mouth once daily, Disp: 90 tablet, Rfl: 0   rosuvastatin (CRESTOR) 10 MG tablet, TAKE 1 TABLET BY MOUTH EVERY MONDAY, TUESDAY, WEDNESDAY, THURS, AND FRIDAY, Disp: 64 tablet, Rfl: 3   Semaglutide (RYBELSUS) 7 MG TABS, Take 7 mg by mouth daily., Disp: , Rfl:    tadalafil (CIALIS) 5 MG tablet, TAKE 1 TABLET BY MOUTH ONCE A DAY., Disp: 30 tablet, Rfl: 2   UNABLE TO FIND, Take 1 capsule by mouth daily at 2 am. Med Name:  testosterone, Disp: , Rfl:    No Known Allergies   Review of Systems  Constitutional: Negative.   HENT: Negative.    Eyes:  Negative for blurred vision.  Respiratory: Negative.  Negative for shortness of breath.   Cardiovascular: Negative.  Negative for chest pain and palpitations.  Gastrointestinal: Negative.   Endocrine: Negative for polydipsia, polyphagia and polyuria.  Genitourinary: Negative.   Skin: Negative.   Allergic/Immunologic: Negative.   Neurological: Negative.   Hematological: Negative.      Today's Vitals   09/05/23 1140  BP: 110/78  Pulse: 64  Temp: 98.1 F (36.7 C)  SpO2: 98%  Weight: 224 lb 3.2 oz (101.7 kg)  Height: 6' (1.829 m)   Body mass index is 30.41 kg/m.  Wt Readings from Last 3 Encounters:  09/05/23 224 lb 3.2 oz (101.7 kg)  05/24/23 225 lb 9.6 oz (102.3 kg)  04/05/23 229 lb (103.9 kg)     Objective:  Physical Exam Vitals and nursing note reviewed.  Constitutional:      Appearance: Normal appearance.  HENT:     Head: Normocephalic and atraumatic.  Eyes:     Extraocular Movements: Extraocular movements intact.  Cardiovascular:     Rate and Rhythm: Normal rate and regular rhythm.     Heart sounds: Normal heart sounds.  Pulmonary:     Effort: Pulmonary effort is normal.     Breath sounds: Normal breath sounds.  Musculoskeletal:     Cervical back: Normal range of motion.  Skin:    General: Skin is warm.  Neurological:     General: No focal deficit present.     Mental Status: He is alert.  Psychiatric:        Mood and Affect: Mood normal.         Assessment And Plan:  Type 2 diabetes mellitus with stage 2 chronic kidney disease, with long-term current use of insulin (HCC) Assessment & Plan: Chronic, he is now on Synjardy 25mg /1000mg  XR daily and Rybelsus 3mg  daily. CCM Pharmacy input is appreciated. I reassured him that I work very closely with the Pharmacy team and I approve all medication changes. I advised him that he should  take TWO 3mg  Rybelsus tablets until he runs out, and then transition to the 7mg  daily.  He did not increase Rybelsus to 7mg  as d/w Catie because he states he did not have a clear understanding on why she was changing the dose.  Additionally, pt advised that Synjardy dose was changed to optimize the metformin dose since his blood sugar/A1c had been elevated. I will check labs as below, and adjust meds as needed.  All questions were answered to his satisfaction.   He will f/u in four months for re-evaluation.   Orders: -     CMP14+EGFR -     Hemoglobin A1c  Hypertensive nephropathy Assessment & Plan: Chronic, well controlled.  He will continue with olmesartan/hct 20/12.5mg  daily. He is encouraged to follow low sodium diet.    Pure hypercholesterolemia Assessment & Plan: Chronic,LDL goal < 70.  He will c/w rosuvastatin 10mg  daily.  Lab Results  Component Value Date   LDLCALC 62 11/28/2022      Class 1 obesity due to excess calories with serious comorbidity and body mass index (BMI) of 30.0 to 30.9 in adult Assessment & Plan: He is encouraged to initially strive for BMI less than 30 to decrease cardiac risk. He is advised to exercise no less than 150 minutes per week.     He is encouraged to strive for BMI less than 30 to decrease cardiac risk. Advised to aim for at least 150 minutes of exercise per week.    Return if symptoms worsen or fail to improve.  Patient was given opportunity to ask questions. Patient verbalized understanding of the plan and was able to repeat key elements of the plan. All questions were answered to their satisfaction.    I, Andres Aliment, MD, have reviewed all documentation for this visit. The documentation on 09/05/23 for the exam, diagnosis, procedures, and orders are all accurate and complete.   IF YOU HAVE BEEN REFERRED TO A SPECIALIST, IT MAY TAKE 1-2 WEEKS TO SCHEDULE/PROCESS THE REFERRAL. IF YOU HAVE NOT HEARD FROM US/SPECIALIST IN TWO WEEKS, PLEASE  GIVE [place] A CALL AT [phone].   THE PATIENT IS ENCOURAGED TO PRACTICE SOCIAL DISTANCING DUE TO THE COVID-19 PANDEMIC.

## 2023-09-05 NOTE — Assessment & Plan Note (Signed)
 He is encouraged to initially strive for BMI less than 30 to decrease cardiac risk. He is advised to exercise no less than 150 minutes per week.

## 2023-09-06 ENCOUNTER — Telehealth: Payer: Self-pay | Admitting: Pharmacist

## 2023-09-06 DIAGNOSIS — E1122 Type 2 diabetes mellitus with diabetic chronic kidney disease: Secondary | ICD-10-CM

## 2023-09-06 LAB — CMP14+EGFR
ALT: 21 [IU]/L (ref 0–44)
AST: 22 [IU]/L (ref 0–40)
Albumin: 4.4 g/dL (ref 3.9–4.9)
Alkaline Phosphatase: 67 [IU]/L (ref 44–121)
BUN/Creatinine Ratio: 11 (ref 10–24)
BUN: 15 mg/dL (ref 8–27)
Bilirubin Total: 0.3 mg/dL (ref 0.0–1.2)
CO2: 23 mmol/L (ref 20–29)
Calcium: 9.7 mg/dL (ref 8.6–10.2)
Chloride: 99 mmol/L (ref 96–106)
Creatinine, Ser: 1.32 mg/dL — ABNORMAL HIGH (ref 0.76–1.27)
Globulin, Total: 3 g/dL (ref 1.5–4.5)
Glucose: 89 mg/dL (ref 70–99)
Potassium: 4.8 mmol/L (ref 3.5–5.2)
Sodium: 138 mmol/L (ref 134–144)
Total Protein: 7.4 g/dL (ref 6.0–8.5)
eGFR: 58 mL/min/{1.73_m2} — ABNORMAL LOW (ref >59)

## 2023-09-06 LAB — HEMOGLOBIN A1C
Est. average glucose Bld gHb Est-mCnc: 151 mg/dL
Hgb A1c MFr Bld: 6.9 % — ABNORMAL HIGH (ref 4.8–5.6)

## 2023-09-06 MED ORDER — RYBELSUS 7 MG PO TABS
7.0000 mg | ORAL_TABLET | Freq: Every day | ORAL | 1 refills | Status: DC
Start: 2023-09-06 — End: 2023-12-05

## 2023-09-06 NOTE — Progress Notes (Signed)
   09/06/2023  Patient ID: Andres [name], male   DOB: May 15, 1951, 73 y.o.   MRN: [id number]  Patient was in for a follow up appointment with Dr. Allyne Gee earlier this week and left several forms/letters from patient assistance programs for me to review.  Patient was called to get some background information.  After speaking with our Medication Assistance Team, it was determined the patient has been approved to receive Synjardy 12.11/998 and Rybelsus 7mg  through the end of the year.    Patient said He has received Synjardy but has not received Rybelsus.  Novo Nordisk has had severe issues with shipping delays.  Novo Nordisk was called with the Patient on conference.  A free trial coupon was obtained and a new prescription was pended to be sent to the Patient's Pharmacy.   Plan: Will follow up.  Beecher Mcardle, PharmD, BCACP Clinical Pharmacist [phone]

## 2023-09-07 ENCOUNTER — Ambulatory Visit: Payer: Self-pay

## 2023-09-07 NOTE — Patient Outreach (Signed)
 Care Coordination   09/07/2023 Name: ISAI GOTTLIEB MRN: [id number] DOB: Aug 28, 1950   Care Coordination Outreach Attempts:  An unsuccessful outreach was attempted for an appointment today.  Follow Up Plan:  Additional outreach attempts will be made to offer the patient complex care management information and services.   Encounter Outcome:  No Answer   Care Coordination Interventions:  No, not indicated    Delsa Sale RN BSN CCM [place]  [facility] Nurse Care Coordinator  Direct Dial: [phone] Website: [email or web address]

## 2023-10-03 ENCOUNTER — Telehealth: Payer: Self-pay

## 2023-10-03 NOTE — Telephone Encounter (Signed)
 Called patient to inform patient assistance is ready for pick up, VM FULL. Mychart message sent.

## 2023-10-04 ENCOUNTER — Telehealth: Payer: Self-pay

## 2023-10-04 NOTE — Telephone Encounter (Signed)
 PT PICKED UP RYBELSUS

## 2023-10-14 ENCOUNTER — Other Ambulatory Visit: Payer: Self-pay | Admitting: Internal Medicine

## 2023-10-14 DIAGNOSIS — I129 Hypertensive chronic kidney disease with stage 1 through stage 4 chronic kidney disease, or unspecified chronic kidney disease: Secondary | ICD-10-CM

## 2023-11-08 ENCOUNTER — Ambulatory Visit: Payer: Medicare HMO

## 2023-11-08 VITALS — BP 108/60 | HR 86 | Temp 98.2°F | Ht 72.0 in | Wt 219.8 lb

## 2023-11-08 DIAGNOSIS — Z0000 Encounter for general adult medical examination without abnormal findings: Secondary | ICD-10-CM

## 2023-11-08 NOTE — Progress Notes (Signed)
 Subjective:   Andres [name] is a 73 y.o. who presents for a Medicare Wellness preventive visit.  Visit Complete: In person    Persons Participating in Visit: Patient.  AWV Questionnaire: No: Patient Medicare AWV questionnaire was not completed prior to this visit.  Cardiac Risk Factors include: advanced age (>68men, >8 women);diabetes mellitus;hypertension;male gender     Objective:    Today's Vitals   11/08/23 1613  BP: 108/60  Pulse: 86  Temp: 98.2 F (36.8 C)  TempSrc: Oral  SpO2: 93%  Weight: 219 lb 12.8 oz (99.7 kg)  Height: 6' (1.829 m)   Body mass index is 29.81 kg/m.     11/08/2023    4:26 PM 12/01/2022   11:01 AM 11/25/2021   12:20 PM 11/12/2020   10:30 AM 11/07/2019   10:36 AM 10/30/2018   10:10 AM 09/25/2014    6:30 AM  Advanced Directives  Does Patient Have a Medical Advance Directive? No No No Yes Yes No Yes  Type of Advance Directive    Living will Living will  Healthcare Power of [place];Living will  Does patient want to make changes to medical advance directive?       No - Patient declined  Copy of Healthcare Power of Attorney in Chart?       No - copy requested  Would patient like information on creating a medical advance directive? No - Patient declined     Yes (MAU/Ambulatory/Procedural Areas - Information given)     Current Medications (verified) Outpatient Encounter Medications as of 11/08/2023  Medication Sig   Accu-Chek FastClix Lancets MISC One tab po qd dx; e11.65   ACCU-CHEK GUIDE test strip Use to check BS qd dx: E11.65   Arginine 500 MG CAPS Take by mouth daily. Daily   aspirin 81 MG tablet Take 81 mg by mouth daily.   cholecalciferol (VITAMIN D) 1000 UNITS tablet Take 4,000 Units by mouth daily.   Cyanocobalamin  (VITAMIN B 12) 250 MCG LOZG Take 1 tablet by mouth daily.   Empagliflozin-metFORMIN  HCl (SYNJARDY ) 12.11-998 MG TABS Take 1 tablet by mouth 2 (two) times daily.   fluticasone  (CUTIVATE ) 0.05 % cream Apply topically 2 (two)  times daily as needed.   levocetirizine (XYZAL ) 5 MG tablet Take 1 tablet (5 mg total) by mouth every evening.   Multiple Vitamin (MULTIVITAMIN) tablet Take 1 tablet by mouth daily.   olmesartan -hydrochlorothiazide (BENICAR  HCT) 20-12.5 MG tablet Take 1 tablet by mouth once daily   rosuvastatin  (CRESTOR ) 10 MG tablet TAKE 1 TABLET BY MOUTH EVERY MONDAY, TUESDAY, WEDNESDAY, THURS, AND FRIDAY   Semaglutide  (RYBELSUS ) 7 MG TABS Take 1 tablet (7 mg total) by mouth daily.   tadalafil  (CIALIS ) 5 MG tablet TAKE 1 TABLET BY MOUTH ONCE A DAY.   UNABLE TO FIND Take 1 capsule by mouth daily at 2 am. [place]   Continuous Glucose Sensor (FREESTYLE LIBRE 3 PLUS SENSOR) MISC Change sensor every 15 days. (Patient not taking: Reported on 11/08/2023)   No facility-administered encounter medications on file as of 11/08/2023.    Allergies (verified) Patient has no known allergies.   History: Past Medical History:  Diagnosis Date   Diabetes (HCC)    High cholesterol    Hypertension    states under control with med., has been on med. x 5 yr.   Seasonal allergies    Umbilical hernia 09/2014   Past Surgical History:  Procedure Laterality Date   INSERTION OF MESH N/A 09/25/2014   Procedure: INSERTION  OF MESH;  Surgeon: Dareen Ebbing, MD;  Location: [facility];  Service: General;  Laterality: N/A;   ORIF PATELLA FRACTURE Right 06/16/2008   TOTAL HIP ARTHROPLASTY Left 12/17/2007   UMBILICAL HERNIA REPAIR N/A 09/25/2014   Procedure: UMBILICAL HERNIA REPAIR WITH MESH;  Surgeon: Dareen Ebbing, MD;  Location: [facility];  Service: General;  Laterality: N/A;   Family History  Problem Relation Age of Onset   Healthy Mother    Early death Father    Social History   Socioeconomic History   Marital status: Legally Separated    Spouse name: Not on file   Number of children: Not on file   Years of education: Not on file   Highest education level: Not on file  Occupational History    Occupation: retired  Tobacco Use   Smoking status: Former    Current packs/day: 0.00    Average packs/day: 0.5 packs/day for 22.0 years (11.0 ttl pk-yrs)    Types: Cigarettes    Start date: 07/10/1989    Quit date: 07/11/2011    Years since quitting: 12.3   Smokeless tobacco: Never   Tobacco comments:    he has quit  Vaping Use   Vaping status: Never Used  Substance and Sexual Activity   Alcohol use: No   Drug use: No   Sexual activity: Yes  Other Topics Concern   Not on file  Social History Narrative   Not on file   Social Drivers of Health   Financial Resource Strain: Low Risk  (11/08/2023)   Overall Financial Resource Strain (CARDIA)    Difficulty of Paying Living Expenses: Not hard at all  Food Insecurity: No Food Insecurity (11/08/2023)   Hunger Vital Sign    Worried About Running Out of Food in the Last Year: Never true    Ran Out of Food in the Last Year: Never true  Transportation Needs: No Transportation Needs (11/08/2023)   PRAPARE - Administrator, Civil Service (Medical): No    Lack of Transportation (Non-Medical): No  Physical Activity: Insufficiently Active (11/08/2023)   Exercise Vital Sign    Days of Exercise per Week: 2 days    Minutes of Exercise per Session: 50 min  Stress: No Stress Concern Present (11/08/2023)   [employer] of Occupational Health - Occupational Stress Questionnaire    Feeling of Stress : Not at all  Social Connections: Moderately Integrated (11/08/2023)   Social Connection and Isolation Panel [NHANES]    Frequency of Communication with Friends and Family: More than three times a week    Frequency of Social Gatherings with Friends and Family: Twice a week    Attends Religious Services: More than 4 times per year    Active Member of [employer] or Organizations: Not on file    Attends Engineer, structural: More than 4 times per year    Marital Status: Divorced    Tobacco Counseling Counseling given: Not  Answered Tobacco comments: he has quit    Clinical Intake:  Pre-visit preparation completed: Yes  Pain : No/denies pain     Nutritional Status: BMI 25 -29 Overweight Nutritional Risks: None Diabetes: Yes CBG done?: No Did pt. bring in CBG monitor from home?: No  Lab Results  Component Value Date   HGBA1C 6.9 (H) 09/05/2023   HGBA1C 7.6 (H) 05/24/2023   HGBA1C 8.0 (H) 01/25/2023     How often do you need to have someone help you when you  read instructions, pamphlets, or other written materials from your doctor or pharmacy?: 1 - Never  Interpreter Needed?: No  Information entered by :: NAllen LPN   Activities of Daily Living     11/08/2023    4:19 PM 12/01/2022   11:02 AM  In your present state of health, do you have any difficulty performing the following activities:  Hearing? 0 0  Vision? 0 0  Difficulty concentrating or making decisions? 0 0  Walking or climbing stairs? 0 0  Dressing or bathing? 0 0  Doing errands, shopping? 0 0  Preparing Food and eating ? N N  Using the Toilet? N N  In the past six months, have you accidently leaked urine? N N  Do you have problems with loss of bowel control? N N  Managing your Medications? N N  Managing your Finances? N N  Housekeeping or managing your Housekeeping? N N    Patient Care Team: Cleave Curling, MD as PCP - General (Internal Medicine) Amanda Jungling Joyceann No, MD as PCP - Cardiology (Cardiology) Nathen Balder Skeeter Dukes, RN as [facility] Jannelle Memory, RPH-CPP (Pharmacist) Nathen Balder, Skeeter Dukes, RN  Indicate any recent Medical Services you may have received from other than [facility] providers in the past year (date may be approximate).     Assessment:   This is a routine wellness examination for Andres [name].  Hearing/Vision screen Hearing Screening - Comments:: Denies hearing issues Vision Screening - Comments:: Regular eye exams, [facility]   Goals Addressed             This Visit's Progress    Patient Stated        11/08/2023, stay healthy       Depression Screen     11/08/2023    4:28 PM 09/05/2023   11:45 AM 09/05/2023   11:41 AM 12/01/2022   11:02 AM 11/28/2022    9:55 AM 08/01/2022   10:39 AM 11/25/2021   12:21 PM  PHQ 2/9 Scores  PHQ - 2 Score 0 0 0 0 0 0 0  PHQ- 9 Score 0 0 0  0      Fall Risk     11/08/2023    4:28 PM 09/05/2023   11:41 AM 01/25/2023    9:55 AM 12/01/2022   11:02 AM 11/28/2022    9:54 AM  Fall Risk   Falls in the past year? 0 0 0 0 0  Number falls in past yr: 0 0 0 0 0  Injury with Fall? 0 0 0 0 0  Risk for fall due to : Medication side effect No Fall Risks No Fall Risks Medication side effect No Fall Risks  Follow up Falls prevention discussed;Falls evaluation completed Falls evaluation completed Falls evaluation completed Falls prevention discussed;Education provided;Falls evaluation completed Falls evaluation completed    MEDICARE RISK AT HOME:  Medicare Risk at Home Any stairs in or around the home?: Yes If so, are there any without handrails?: Yes Home free of loose throw rugs in walkways, pet beds, electrical cords, etc?: Yes Adequate lighting in your home to reduce risk of falls?: Yes Life alert?: No Use of a cane, walker or w/c?: No Grab bars in the bathroom?: No Shower chair or bench in shower?: No Elevated toilet seat or a handicapped toilet?: Yes  TIMED UP AND GO:  Was the test performed?  Yes  Length of time to ambulate 10 feet: 5 sec Gait steady and fast without use of assistive device  Cognitive Function:  6CIT completed        11/08/2023    4:29 PM 12/01/2022   11:03 AM 11/25/2021   12:22 PM 11/12/2020   10:32 AM 11/07/2019   10:38 AM  6CIT Screen  What Year? 0 points 0 points 0 points 0 points 0 points  What month? 0 points 0 points 0 points 0 points 0 points  What time? 0 points 3 points 0 points 0 points 0 points  Count back from 20 0 points 2 points 0 points 0 points 0 points  Months in reverse 2 points 0 points 0 points 0 points  0 points  Repeat phrase 6 points 2 points 2 points 0 points 2 points  Total Score 8 points 7 points 2 points 0 points 2 points    Immunizations Immunization History  Administered Date(s) Administered   Fluad Quad(high Dose 65+) 05/08/2020, 03/22/2021, 03/30/2022   Fluad Trivalent(High Dose 65+) 04/05/2023   Influenza, High Dose Seasonal PF 04/11/2018, 04/01/2019   Moderna Covid-19 Vaccine Bivalent Booster 79yrs & up 09/07/2021   Moderna SARS-COV2 Booster Vaccination 05/22/2020   Moderna Sars-Covid-2 Vaccination 08/17/2019, 09/17/2019   PNEUMOCOCCAL CONJUGATE-20 08/02/2021   Pneumococcal Polysaccharide-23 07/23/2018   Tdap 04/17/2019   Zoster Recombinant(Shingrix) 04/19/2021, 08/30/2021    Screening Tests Health Maintenance  Topic Date Due   COVID-19 Vaccine (4 - 2024-25 season) 03/12/2023   Diabetic kidney evaluation - Urine ACR  11/28/2023   FOOT EXAM  11/28/2023   INFLUENZA VACCINE  02/09/2024   HEMOGLOBIN A1C  03/04/2024   OPHTHALMOLOGY EXAM  07/09/2024   Diabetic kidney evaluation - eGFR measurement  09/04/2024   Medicare Annual Wellness (AWV)  11/07/2024   DTaP/Tdap/Td (2 - Td or Tdap) 04/16/2029   Colonoscopy  01/21/2032   Pneumonia Vaccine 83+ Years old  Completed   Hepatitis C Screening  Completed   Zoster Vaccines- Shingrix  Completed   HPV VACCINES  Aged Out   Meningococcal B Vaccine  Aged Out    Health Maintenance  Health Maintenance Due  Topic Date Due   COVID-19 Vaccine (4 - 2024-25 season) 03/12/2023   Diabetic kidney evaluation - Urine ACR  11/28/2023   Health Maintenance Items Addressed: Due for covid vaccine  Additional Screening:  Vision Screening: Recommended annual ophthalmology exams for early detection of glaucoma and other disorders of the eye.  Dental Screening: Recommended annual dental exams for proper oral hygiene  Community Resource Referral / Chronic Care Management: CRR required this visit?  No   CCM required this visit?   No     Plan:     I have personally reviewed and noted the following in the patient's chart:   Medical and social history Use of alcohol, tobacco or illicit drugs  Current medications and supplements including opioid prescriptions. Patient is not currently taking opioid prescriptions. Functional ability and status Nutritional status Physical activity Advanced directives List of other physicians Hospitalizations, surgeries, and ER visits in previous 12 months Vitals Screenings to include cognitive, depression, and falls Referrals and appointments  In addition, I have reviewed and discussed with patient certain preventive protocols, quality metrics, and best practice recommendations. A written personalized care plan for preventive services as well as general preventive health recommendations were provided to patient.     Areatha Beecham, LPN   10/17/8117   After Visit Summary: (In Person-Printed) AVS printed and given to the patient  Notes: PCP Follow Up Recommendations: Been having slight headaches since increase in rybelsus . Could that be the reason for  the headaches. Did not have them with the 4 mg.

## 2023-11-08 NOTE — Patient Instructions (Signed)
 Andres [name] , Thank you for taking time to come for your Medicare Wellness Visit. I appreciate your ongoing commitment to your health goals. Please review the following plan we discussed and let me know if I can assist you in the future.   Referrals/Orders/Follow-Ups/Clinician Recommendations: headaches since increase in rybelsus .  This is a list of the screening recommended for you and due dates:  Health Maintenance  Topic Date Due   COVID-19 Vaccine (4 - 2024-25 season) 03/12/2023   Yearly kidney health urinalysis for diabetes  11/28/2023   Complete foot exam   11/28/2023   Flu Shot  02/09/2024   Hemoglobin A1C  03/04/2024   Eye exam for diabetics  07/09/2024   Yearly kidney function blood test for diabetes  09/04/2024   Medicare Annual Wellness Visit  11/07/2024   DTaP/Tdap/Td vaccine (2 - Td or Tdap) 04/16/2029   Colon Cancer Screening  01/21/2032   Pneumonia Vaccine  Completed   Hepatitis C Screening  Completed   Zoster (Shingles) Vaccine  Completed   HPV Vaccine  Aged Out   Meningitis B Vaccine  Aged Out    Advanced directives: (Provided) Advance directive discussed with you today. I have provided a copy for you to complete at home and have notarized. Once this is complete, please bring a copy in to our office so we can scan it into your chart.   Next Medicare Annual Wellness Visit scheduled for next year: Yes  insert Preventive Care attachment Insert FALL PREVENTION attachment if needed

## 2023-11-09 ENCOUNTER — Telehealth: Payer: Self-pay

## 2023-11-09 NOTE — Telephone Encounter (Signed)
 Patient drop off a letter regarding a balance of 909.00 for [facility] 04/07/23. Called Account Rep Maryjean Sniff and she stated that the bill was coming from a hospital visit at [facility] she would not give me details regarding the visit or the second insurance that was bill because patient states he does not have a secondary insurance. I called patient and advise him to give her a call regarding this matter.

## 2023-11-14 ENCOUNTER — Other Ambulatory Visit: Payer: Self-pay | Admitting: Internal Medicine

## 2023-11-14 DIAGNOSIS — R0982 Postnasal drip: Secondary | ICD-10-CM

## 2023-12-04 ENCOUNTER — Other Ambulatory Visit: Payer: Self-pay | Admitting: Internal Medicine

## 2023-12-05 ENCOUNTER — Encounter: Payer: Self-pay | Admitting: Internal Medicine

## 2023-12-05 ENCOUNTER — Ambulatory Visit: Payer: Self-pay | Admitting: Internal Medicine

## 2023-12-05 ENCOUNTER — Telehealth: Payer: Self-pay | Admitting: Pharmacist

## 2023-12-05 ENCOUNTER — Ambulatory Visit (INDEPENDENT_AMBULATORY_CARE_PROVIDER_SITE_OTHER): Payer: Medicare PPO | Admitting: Internal Medicine

## 2023-12-05 ENCOUNTER — Other Ambulatory Visit: Payer: Self-pay

## 2023-12-05 VITALS — BP 112/72 | HR 94 | Temp 98.1°F | Ht 72.0 in | Wt 219.0 lb

## 2023-12-05 DIAGNOSIS — I129 Hypertensive chronic kidney disease with stage 1 through stage 4 chronic kidney disease, or unspecified chronic kidney disease: Secondary | ICD-10-CM

## 2023-12-05 DIAGNOSIS — N182 Chronic kidney disease, stage 2 (mild): Secondary | ICD-10-CM | POA: Diagnosis not present

## 2023-12-05 DIAGNOSIS — Z125 Encounter for screening for malignant neoplasm of prostate: Secondary | ICD-10-CM

## 2023-12-05 DIAGNOSIS — E1122 Type 2 diabetes mellitus with diabetic chronic kidney disease: Secondary | ICD-10-CM

## 2023-12-05 DIAGNOSIS — Z794 Long term (current) use of insulin: Secondary | ICD-10-CM | POA: Diagnosis not present

## 2023-12-05 DIAGNOSIS — Z0000 Encounter for general adult medical examination without abnormal findings: Secondary | ICD-10-CM | POA: Diagnosis not present

## 2023-12-05 DIAGNOSIS — E7800 Pure hypercholesterolemia, unspecified: Secondary | ICD-10-CM | POA: Diagnosis not present

## 2023-12-05 LAB — POCT URINALYSIS DIPSTICK
Bilirubin, UA: NEGATIVE
Blood, UA: NEGATIVE
Glucose, UA: POSITIVE — AB
Ketones, UA: NEGATIVE
Leukocytes, UA: NEGATIVE
Nitrite, UA: NEGATIVE
Protein, UA: NEGATIVE
Spec Grav, UA: 1.015 (ref 1.010–1.025)
Urobilinogen, UA: 0.2 U/dL
pH, UA: 6 (ref 5.0–8.0)

## 2023-12-05 MED ORDER — RYBELSUS 7 MG PO TABS: 7.0000 mg | ORAL_TABLET | Freq: Every day | ORAL | 1 refills | Status: DC

## 2023-12-05 NOTE — Assessment & Plan Note (Addendum)
 Chronic, diabetic foot exam was performed. Type 2 diabetes well-managed. Temporary glucose increase likely due to antibiotics. No hypoglycemia. Resolved tingling in toes post-antibiotic. No significant medication side effects. - Provide Rybelsus  3 mg samples, pt states he is about to run out of current supply of patient assistance supply - Instruct to take two 3 mg tablets if 7 mg unavailable. - Check Rybelsus  approval status with Baruch Likens. - Advise hydration, especially during exercise. - Discuss potential glucose increase with Rybelsus  dosage change.

## 2023-12-05 NOTE — Progress Notes (Signed)
 I,Andres [name], CMA,acting as a Neurosurgeon for Andres Dung, MD.,have documented all relevant documentation on the behalf of Andres Dung, MD,as directed by  Andres Dung, MD while in the presence of Andres Dung, MD.  Subjective:   Patient ID: Andres [name] , male    DOB: 14-Apr-1951 , 73 y.o.   MRN: [id number]  Chief Complaint  Patient presents with   Annual Exam    Patient presents today for annual exam. He reports compliance with medications. Denies headache, chest pain & sob. He has no specific questions or concerns.  He completes prostate exams with Dr Anthoney Kipper.    Diabetes   Hypertension   Hyperlipidemia    HPI Discussed the use of AI scribe software for clinical note transcription with the patient, who gave verbal consent to proceed.  History of Present Illness Andres [name] is a 73 year old male with diabetes who presents for a physical and diabetes check.  His blood sugar levels have been stable, with recent readings of 112 mg/dL last night and 409 mg/dL this morning, attributed to fasting. He recently discontinued hydrocodone and antibiotics prescribed by his dentist after a tooth extraction, which he believes may have influenced his blood sugar levels. During the antibiotic course, he experienced tingling and itching in his toes, which resolved after stopping the medication.  He is currently taking Rybelsus  7 mg, rosuvastatin  10 mg Monday through Friday, olmesartan  20/12.5 mg for blood pressure, and Synjardy  12.11/998 mg twice a day. He needs a refill for Rybelsus  and is currently using samples. He is not using the [place] system for glucose monitoring due to insurance coverage issues.  He exercises, though not as much as he would like, and walks in his driveway due to traffic concerns in his neighborhood. He experienced significant sweating and possible dehydration during a recent exercise session, which he attributes to medication. He drinks water after exercise,  especially after significant sweating.  He has an upcoming urology appointment in July, which may be moved to June. He also mentions a previous eye doctor report from February, which he believes should be in his chart.  No current symptoms or concerns. No low blood sugar episodes. He experienced diarrhea while on antibiotics.   He presents for DM/HTN follow-up. He is now on Synjardy . He reports compliance with meds. He denies headaches, chest pain and shortness of breath. He denies having any specific questions or concerns.   Accucheck- 121  Diabetes He presents for his follow-up diabetic visit. He has type 2 diabetes mellitus. His disease course has been stable. There are no hypoglycemic associated symptoms. Pertinent negatives for diabetes include no blurred vision, no chest pain, no polydipsia, no polyphagia and no polyuria. There are no hypoglycemic complications. Diabetic complications include nephropathy. Risk factors for coronary artery disease include diabetes mellitus, dyslipidemia, hypertension and male sex. He is following a diabetic diet. His breakfast blood glucose is taken between 8-9 am. His breakfast blood glucose range is generally 90-110 mg/dl. An ACE inhibitor/angiotensin II receptor blocker is being taken. Eye exam is not current.  Hypertension This is a chronic problem. The current episode started more than 1 year ago. The problem has been gradually improving since onset. The problem is controlled. Pertinent negatives include no blurred vision, chest pain, palpitations or shortness of breath. Past treatments include angiotensin blockers and diuretics. The current treatment provides moderate improvement. Identifiable causes of hypertension include chronic renal disease.  Hyperlipidemia This is a chronic problem. The  current episode started more than 1 year ago. Exacerbating diseases include chronic renal disease and diabetes. Pertinent negatives include no chest pain or shortness  of breath. Current antihyperlipidemic treatment includes statins. Compliance problems include adherence to exercise.  Risk factors for coronary artery disease include hypertension, male sex, diabetes mellitus and dyslipidemia.     Past Medical History:  Diagnosis Date   Diabetes (HCC)    High cholesterol    Hypertension    states under control with med., has been on med. x 5 yr.   Seasonal allergies    Umbilical hernia 09/2014     Family History  Problem Relation Age of Onset   Healthy Mother    Early death Father      Current Outpatient Medications:    Accu-Chek FastClix Lancets MISC, One tab po qd dx; e11.65, Disp: 100 each, Rfl: 11   ACCU-CHEK GUIDE test strip, Use to check BS qd dx: E11.65, Disp: 100 each, Rfl: 11   Arginine 500 MG CAPS, Take by mouth daily. Daily, Disp: , Rfl:    aspirin 81 MG tablet, Take 81 mg by mouth daily., Disp: , Rfl:    cholecalciferol (VITAMIN D) 1000 UNITS tablet, Take 4,000 Units by mouth daily., Disp: , Rfl:    Cyanocobalamin  (VITAMIN B 12) 250 MCG LOZG, Take 1 tablet by mouth daily., Disp: , Rfl:    Empagliflozin-metFORMIN  HCl (SYNJARDY ) 12.11-998 MG TABS, Take 1 tablet by mouth 2 (two) times daily., Disp: 180 tablet, Rfl: 3   levocetirizine (XYZAL ) 5 MG tablet, TAKE ONE TABLET BY MOUTH IN THE EVENING., Disp: 90 tablet, Rfl: 1   Multiple Vitamin (MULTIVITAMIN) tablet, Take 1 tablet by mouth daily., Disp: , Rfl:    olmesartan -hydrochlorothiazide (BENICAR  HCT) 20-12.5 MG tablet, Take 1 tablet by mouth once daily, Disp: 90 tablet, Rfl: 0   rosuvastatin  (CRESTOR ) 10 MG tablet, TAKE 1 TABLET BY MOUTH EVERY MONDAY, TUESDAY, WEDNESDAY, THURS, AND FRIDAY, Disp: 64 tablet, Rfl: 3   tadalafil  (CIALIS ) 5 MG tablet, TAKE 1 TABLET BY MOUTH ONCE A DAY., Disp: 30 tablet, Rfl: 2   UNABLE TO FIND, Take 1 capsule by mouth daily at 2 am. Manson Seitz, Disp: , Rfl:    Continuous Glucose Sensor (FREESTYLE LIBRE 3 PLUS SENSOR) MISC, Change sensor every 15 days. (Patient not  taking: Reported on 11/08/2023), Disp: 6 each, Rfl: 3   fluticasone  (CUTIVATE ) 0.05 % cream, APPLY TO THE AFFECTED AREA(S) TOPICALLY TWICE DAILY., Disp: 60 g, Rfl: 1   GENERIC EXTERNAL MEDICATION, Take 1 capsule by mouth daily. [place] from [facility], Disp: , Rfl:    Semaglutide  (RYBELSUS ) 7 MG TABS, Take 1 tablet (7 mg total) by mouth daily., Disp: 90 tablet, Rfl: 1   No Known Allergies   Men's preventive visit. Patient Health Questionnaire (PHQ-2) is  Flowsheet Row Office Visit from 12/05/2023 in [facility]  PHQ-2 Total Score 0     . Patient is on a diabetic diet. Marital status: Legally Separated. Relevant history for alcohol use is:  Social History   Substance and Sexual Activity  Alcohol Use No  . Relevant history for tobacco use is:  Social History   Tobacco Use  Smoking Status Former   Current packs/day: 0.00   Average packs/day: 0.5 packs/day for 22.0 years (11.0 ttl pk-yrs)   Types: Cigarettes   Start date: 07/10/1989   Quit date: 07/11/2011   Years since quitting: 12.4   Passive exposure: Never  Smokeless Tobacco Never  Tobacco  Comments   he has quit  .   Review of Systems  Constitutional: Negative.   HENT: Negative.    Eyes:  Negative for blurred vision.  Respiratory:  Negative for shortness of breath.   Cardiovascular: Negative.  Negative for chest pain and palpitations.  Endocrine: Negative for polydipsia, polyphagia and polyuria.  Genitourinary: Negative.   Skin: Negative.   Allergic/Immunologic: Negative.   Neurological: Negative.   Hematological: Negative.      Today's Vitals   12/05/23 0932  BP: 112/72  Pulse: 94  Temp: 98.1 F (36.7 C)  SpO2: 98%  Weight: 219 lb (99.3 kg)  Height: 6' (1.829 m)   Body mass index is 29.7 kg/m.  Wt Readings from Last 3 Encounters:  12/05/23 219 lb (99.3 kg)  11/08/23 219 lb 12.8 oz (99.7 kg)  09/05/23 224 lb 3.2 oz (101.7 kg)    Objective:  Physical Exam Vitals and nursing  note reviewed.  Constitutional:      Appearance: Normal appearance.  HENT:     Head: Normocephalic and atraumatic.     Right Ear: Tympanic membrane, ear canal and external ear normal.     Left Ear: Tympanic membrane, ear canal and external ear normal.     Nose: Nose normal.     Mouth/Throat:     Mouth: Mucous membranes are moist.     Pharynx: Oropharynx is clear.  Eyes:     Extraocular Movements: Extraocular movements intact.     Conjunctiva/sclera: Conjunctivae normal.     Pupils: Pupils are equal, round, and reactive to light.  Cardiovascular:     Rate and Rhythm: Normal rate and regular rhythm.     Pulses: Normal pulses.          Dorsalis pedis pulses are 2+ on the right side and 2+ on the left side.     Heart sounds: Normal heart sounds.  Pulmonary:     Effort: Pulmonary effort is normal.     Breath sounds: Normal breath sounds.  Chest:  Breasts:    Right: Normal. No swelling, bleeding, inverted nipple, mass or nipple discharge.     Left: Normal. No swelling, bleeding, inverted nipple, mass or nipple discharge.  Abdominal:     General: Bowel sounds are normal.     Palpations: Abdomen is soft.  Genitourinary:    Comments: Deferred  Musculoskeletal:        General: Normal range of motion.     Cervical back: Normal range of motion and neck supple.  Feet:     Right foot:     Protective Sensation: 5 sites tested.  5 sites sensed.     Skin integrity: Dry skin present.     Toenail Condition: Right toenails are long.     Left foot:     Protective Sensation: 5 sites tested.  5 sites sensed.     Skin integrity: Dry skin present.     Toenail Condition: Left toenails are long.  Skin:    General: Skin is warm.  Neurological:     General: No focal deficit present.     Mental Status: He is alert.  Psychiatric:        Mood and Affect: Mood normal.        Behavior: Behavior normal.         Assessment And Plan:    Encounter for general adult medical examination w/o  abnormal findings Assessment & Plan: A full exam was performed.  DRE deferred, per patient request.  He is followed by Urology for prostate exams.  He is advised to get 30-45 minutes of regular exercise, no less than four to five days per week. Both weight-bearing and aerobic exercises are recommended.  He is advised to follow a healthy diet with at least six fruits/veggies per day, decrease intake of red meat and other saturated fats and to increase fish intake to twice weekly.  Meats/fish should not be fried -- baked, boiled or broiled is preferable. It is also important to cut back on your sugar intake.  Be sure to read labels - try to avoid anything with added sugar, high fructose corn syrup or other sweeteners.  If you must use a sweetener, you can try stevia or monkfruit.  It is also important to avoid artificially sweetened foods/beverages and diet drinks. Lastly, wear SPF 50 sunscreen on exposed skin and when in direct sunlight for an extended period of time.  Be sure to avoid fast food restaurants and aim for at least 60 ounces of water daily.       Type 2 diabetes mellitus with stage 2 chronic kidney disease, with long-term current use of insulin (HCC) Assessment & Plan: Chronic, diabetic foot exam was performed. Type 2 diabetes well-managed. Temporary glucose increase likely due to antibiotics. No hypoglycemia. Resolved tingling in toes post-antibiotic. No significant medication side effects. - Provide Rybelsus  3 mg samples, pt states he is about to run out of current supply of patient assistance supply - Instruct to take two 3 mg tablets if 7 mg unavailable. - Check Rybelsus  approval status with Andres [name]. - Advise hydration, especially during exercise. - Discuss potential glucose increase with Rybelsus  dosage change.  Orders: -     CMP14+EGFR -     Lipid panel -     Hemoglobin A1c -     CBC -     EKG 12-Lead -     POCT urinalysis dipstick -     Microalbumin / creatinine urine  ratio  Hypertensive nephropathy Assessment & Plan: Chronic, controlled. EKG performed, NSR w/ frequent PACs, nonspecific T abnormality. He is encouraged to stay well hydrated and to take meds as prescribed - check electrolytes - Follow low sodium diet - Follow up in six months  Orders: -     CMP14+EGFR -     EKG 12-Lead -     POCT urinalysis dipstick -     Microalbumin / creatinine urine ratio  Pure hypercholesterolemia Assessment & Plan: Chronic,LDL goal < 70.  He will c/w rosuvastatin  10mg  daily. He is encouraged to follow a heart healthy lifestyle.     Orders: -     CMP14+EGFR -     Lipid panel  Prostate cancer screening -     PSA Total (Reflex To Free)    ADDENDUM: Pharmacist spoke with patient AFTER visit who later confirmed that he did have patient assistance supply of Rybelsus  7mg .  Return for 1 year HM, 4 month dm f/u.Andres [name] Patient was given opportunity to ask questions. Patient verbalized understanding of the plan and was able to repeat key elements of the plan. All questions were answered to their satisfaction.   I, Andres Dung, MD, have reviewed all documentation for this visit. The documentation on 12/05/23 for the exam, diagnosis, procedures, and orders are all accurate and complete.

## 2023-12-05 NOTE — Patient Instructions (Signed)
 Health Maintenance, Male  Adopting a healthy lifestyle and getting preventive care are important in promoting health and wellness. Ask your health care provider about:  The right schedule for you to have regular tests and exams.  Things you can do on your own to prevent diseases and keep yourself healthy.  What should I know about diet, weight, and exercise?  Eat a healthy diet    Eat a diet that includes plenty of vegetables, fruits, low-fat dairy products, and lean protein.  Do not eat a lot of foods that are high in solid fats, added sugars, or sodium.  Maintain a healthy weight  Body mass index (BMI) is a measurement that can be used to identify possible weight problems. It estimates body fat based on height and weight. Your health care provider can help determine your BMI and help you achieve or maintain a healthy weight.  Get regular exercise  Get regular exercise. This is one of the most important things you can do for your health. Most adults should:  Exercise for at least 150 minutes each week. The exercise should increase your heart rate and make you sweat (moderate-intensity exercise).  Do strengthening exercises at least twice a week. This is in addition to the moderate-intensity exercise.  Spend less time sitting. Even light physical activity can be beneficial.  Watch cholesterol and blood lipids  Have your blood tested for lipids and cholesterol at 71 years of age, then have this test every 5 years.  You may need to have your cholesterol levels checked more often if:  Your lipid or cholesterol levels are high.  You are older than 71 years of age.  You are at high risk for heart disease.  What should I know about cancer screening?  Many types of cancers can be detected early and may often be prevented. Depending on your health history and family history, you may need to have cancer screening at various ages. This may include screening for:  Colorectal cancer.  Prostate cancer.  Skin cancer.  Lung  cancer.  What should I know about heart disease, diabetes, and high blood pressure?  Blood pressure and heart disease  High blood pressure causes heart disease and increases the risk of stroke. This is more likely to develop in people who have high blood pressure readings or are overweight.  Talk with your health care provider about your target blood pressure readings.  Have your blood pressure checked:  Every 3-5 years if you are 9-95 years of age.  Every year if you are 85 years old or older.  If you are between the ages of 29 and 29 and are a current or former smoker, ask your health care provider if you should have a one-time screening for abdominal aortic aneurysm (AAA).  Diabetes  Have regular diabetes screenings. This checks your fasting blood sugar level. Have the screening done:  Once every three years after age 23 if you are at a normal weight and have a low risk for diabetes.  More often and at a younger age if you are overweight or have a high risk for diabetes.  What should I know about preventing infection?  Hepatitis B  If you have a higher risk for hepatitis B, you should be screened for this virus. Talk with your health care provider to find out if you are at risk for hepatitis B infection.  Hepatitis C  Blood testing is recommended for:  Everyone born from [birth year] through 1963.  Anyone  with known risk factors for hepatitis C.  Sexually transmitted infections (STIs)  You should be screened each year for STIs, including gonorrhea and chlamydia, if:  You are sexually active and are younger than 71 years of age.  You are older than 71 years of age and your health care provider tells you that you are at risk for this type of infection.  Your sexual activity has changed since you were last screened, and you are at increased risk for chlamydia or gonorrhea. Ask your health care provider if you are at risk.  Ask your health care provider about whether you are at high risk for HIV. Your health care provider  may recommend a prescription medicine to help prevent HIV infection. If you choose to take medicine to prevent HIV, you should first get tested for HIV. You should then be tested every 3 months for as long as you are taking the medicine.  Follow these instructions at home:  Alcohol use  Do not drink alcohol if your health care provider tells you not to drink.  If you drink alcohol:  Limit how much you have to 0-2 drinks a day.  Know how much alcohol is in your drink. In the [place] one drink equals one 12 oz bottle of beer (355 mL), one 5 oz glass of wine (148 mL), or one 1 oz glass of hard liquor (44 mL).  Lifestyle  Do not use any products that contain nicotine or tobacco. These products include cigarettes, chewing tobacco, and vaping devices, such as e-cigarettes. If you need help quitting, ask your health care provider.  Do not use street drugs.  Do not share needles.  Ask your health care provider for help if you need support or information about quitting drugs.  General instructions  Schedule regular health, dental, and eye exams.  Stay current with your vaccines.  Tell your health care provider if:  You often feel depressed.  You have ever been abused or do not feel safe at home.  Summary  Adopting a healthy lifestyle and getting preventive care are important in promoting health and wellness.  Follow your health care provider's instructions about healthy diet, exercising, and getting tested or screened for diseases.  Follow your health care provider's instructions on monitoring your cholesterol and blood pressure.  This information is not intended to replace advice given to you by your health care provider. Make sure you discuss any questions you have with your health care provider.  Document Revised: 11/16/2020 Document Reviewed: 11/16/2020  Elsevier Patient Education  2024 [employer].

## 2023-12-05 NOTE — Progress Notes (Signed)
   12/05/2023  Patient ID: Andres [name], male   DOB: 01-Jun-1951, 73 y.o.   MRN: [id number]  Received message from Dr. Elnita Hai that the Patient was in the office saying that he did not receive Rybelsus .  Nettie Barb, CPhT and I both called [employer] and were informed that Rybelsus  7 mg was delivered to the office on 10/03/23.  The Patient was called and told to pick up his Patient Assistance medication on 10/06/23.    Patient was called.  He said He did receive the shipment but was confused about if the next shipment would come to his home or to the provider practice.  He was informed that His refill will release on 12/22/23 and will be shipped from [facility] to the office for pick up.  He communicated understanding.  Patient was seen today and had labs drawn will follow up on lab work in 1 week.   Geronimo Krabbe, PharmD, BCACP Clinical Pharmacist [phone]

## 2023-12-05 NOTE — Assessment & Plan Note (Signed)
 A full exam was performed.  DRE deferred, per patient request.  He is followed by Urology for prostate exams.  He is advised to get 30-45 minutes of regular exercise, no less than four to five days per week. Both weight-bearing and aerobic exercises are recommended.  He is advised to follow a healthy diet with at least six fruits/veggies per day, decrease intake of red meat and other saturated fats and to increase fish intake to twice weekly.  Meats/fish should not be fried -- baked, boiled or broiled is preferable. It is also important to cut back on your sugar intake.  Be sure to read labels - try to avoid anything with added sugar, high fructose corn syrup or other sweeteners.  If you must use a sweetener, you can try stevia or monkfruit.  It is also important to avoid artificially sweetened foods/beverages and diet drinks. Lastly, wear SPF 50 sunscreen on exposed skin and when in direct sunlight for an extended period of time.  Be sure to avoid fast food restaurants and aim for at least 60 ounces of water daily.

## 2023-12-06 LAB — CMP14+EGFR
ALT: 16 IU/L (ref 0–44)
AST: 19 IU/L (ref 0–40)
Albumin: 4.4 g/dL (ref 3.9–4.9)
Alkaline Phosphatase: 69 IU/L (ref 44–121)
BUN/Creatinine Ratio: 11 (ref 10–24)
BUN: 14 mg/dL (ref 8–27)
Bilirubin Total: 0.5 mg/dL (ref 0.0–1.2)
CO2: 22 mmol/L (ref 20–29)
Calcium: 9.8 mg/dL (ref 8.6–10.2)
Chloride: 96 mmol/L (ref 96–106)
Creatinine, Ser: 1.23 mg/dL (ref 0.76–1.27)
Globulin, Total: 3.1 g/dL (ref 1.5–4.5)
Glucose: 94 mg/dL (ref 70–99)
Potassium: 4.8 mmol/L (ref 3.5–5.2)
Sodium: 136 mmol/L (ref 134–144)
Total Protein: 7.5 g/dL (ref 6.0–8.5)
eGFR: 63 mL/min/{1.73_m2} (ref >59)

## 2023-12-06 LAB — MICROALBUMIN / CREATININE URINE RATIO
Creatinine, Urine: 107.5 mg/dL
Microalb/Creat Ratio: 47 mg/g{creat} — ABNORMAL HIGH (ref 0–29)
Microalbumin, Urine: 51 ug/mL

## 2023-12-06 LAB — PSA TOTAL (REFLEX TO FREE): Prostate Specific Ag, Serum: 2.8 ng/mL (ref 0.0–4.0)

## 2023-12-06 LAB — CBC
Hematocrit: 44.2 % (ref 37.5–51.0)
Hemoglobin: 14.4 g/dL (ref 13.0–17.7)
MCH: 30.6 pg (ref 26.6–33.0)
MCHC: 32.6 g/dL (ref 31.5–35.7)
MCV: 94 fL (ref 79–97)
Platelets: 235 10*3/uL (ref 150–450)
RBC: 4.71 x10E6/uL (ref 4.14–5.80)
RDW: 13.1 % (ref 11.6–15.4)
WBC: 8.3 10*3/uL (ref 3.4–10.8)

## 2023-12-06 LAB — LIPID PANEL
Chol/HDL Ratio: 3.1 ratio (ref 0.0–5.0)
Cholesterol, Total: 89 mg/dL — ABNORMAL LOW (ref 100–199)
HDL: 29 mg/dL — ABNORMAL LOW (ref >39)
LDL Chol Calc (NIH): 40 mg/dL (ref 0–99)
Triglycerides: 108 mg/dL (ref 0–149)
VLDL Cholesterol Cal: 20 mg/dL (ref 5–40)

## 2023-12-06 LAB — HEMOGLOBIN A1C
Est. average glucose Bld gHb Est-mCnc: 134 mg/dL
Hgb A1c MFr Bld: 6.3 % — ABNORMAL HIGH (ref 4.8–5.6)

## 2023-12-07 ENCOUNTER — Other Ambulatory Visit: Payer: Self-pay

## 2023-12-07 NOTE — Patient Instructions (Signed)
 Visit Information  Thank you for taking time to visit with me today.    Following is a copy of your care plan:   Goals Addressed               This Visit's Progress     Patient Stated     COMPLETED: To lower A1c<6.5 % (pt-stated)        Care Coordination Interventions: Provided education to patient about basic DM disease process Reviewed medications with patient and discussed importance of medication adherence, completed medication reconciliation with no discrepancies noted Review of patient status, including review of consultants reports, relevant laboratory and other test results, and medications completed Advised patient of nurse case closure due to patient is managing his chronic health conditions and staying healthy  Routed note to PCP  Lab Results  Component Value Date   HGBA1C 6.3 (H) 12/05/2023         Other     COMPLETED: To have calf pain further evaluated        Care Coordination Interventions: Evaluation of current treatment plan related to pain of left calf and patient's adherence to plan as established by provider Determined patient's symptoms of left calf pain have subsided with no ongoing reported symptoms or concerns at this time Instructed patient to keep his doctor informed of new symptoms or concerns             Please call 1-800-273-TALK (toll free, 24 hour hotline) if you are experiencing a Mental Health or Behavioral Health Crisis or need someone to talk to.  Patient verbalizes understanding of instructions and care plan provided today and agrees to view in MyChart. Active MyChart status and patient understanding of how to access instructions and care plan via MyChart confirmed with patient.     Louanne Roussel RN BSN CCM [place]  [facility] [place], [facility] Nurse Care Coordinator  Direct Dial: [phone] Website: [email or web address]

## 2023-12-07 NOTE — Patient Outreach (Signed)
 Complex Care Management   Visit Note  12/07/2023  Name:  Andres [name] MRN: [id number] DOB: 1950/10/26  Situation: Referral received for Complex Care Management related to type 2 Diabetes with stage 2 chronic kidney disease; Hypertensive Nephropathy, Pure Hypercholesterolemia. I obtained verbal consent from Patient.  Visit completed with patient on the phone.  Background:   Past Medical History:  Diagnosis Date   Diabetes (HCC)    High cholesterol    Hypertension    states under control with med., has been on med. x 5 yr.   Seasonal allergies    Umbilical hernia 09/2014    Assessment: Patient Reported Symptoms:  Cognitive Cognitive Status: Alert and oriented to person, place, and time Cognitive/Intellectual Conditions Management [RPT]: None reported or documented in medical history or problem list   Health Maintenance Behaviors: Annual physical exam, Sleep adequate, Immunizations, Social activities, Healthy diet, Spiritual practice(s) Healing Pattern: Fast Health Facilitated by: Healthy diet, Prayer/meditation, Rest  Neurological Neurological Review of Symptoms: No symptoms reported    HEENT HEENT Symptoms Reported: Mouth or teeth pain HEENT Conditions: Tooth problem(s) Tooth Problems:  (tooth extraction) HEENT Management Strategies: Routine screening HEENT Self-Management Outcome: 4 (good) Tooth problem(s)  Cardiovascular Cardiovascular Symptoms Reported: No symptoms reported Cardiovascular Conditions: Pure Hypercholesterolemia; Hypertensive Nephropathy Cardiovascular Management Strategies: Medication therapy, Routine Screening, Diet modification Cardiovascular Self-Management Outcome: 4 (good)    Respiratory Respiratory Symptoms Reported: No symptoms reported    Endocrine Patient reports the following symptoms related to hypoglycemia or hyperglycemia : No symptoms reported Is patient diabetic?: Yes Is patient checking blood sugars at home?: Yes Endocrine Conditions:  Diabetes Endocrine Management Strategies: Medication therapy, Routine screening, Diet modification Endocrine Self-Management Outcome: 4 (good)  Gastrointestinal Gastrointestinal Symptoms Reported: No symptoms reported      Genitourinary Genitourinary Symptoms Reported: No symptoms reported Genitourinary Conditions: Chronic Kidney disease, stage 2 Genitourinary Management Strategies: Medication therapy, Fluid Modification; Routine Screening Genitourinary Self-Management Outcome: 4 (good)    Integumentary Integumentary Symptoms Reported: No symptoms reported    Musculoskeletal Musculoskelatal Symptoms Reviewed: No symptoms reported   Falls in the past year?: No Number of falls in past year: 1 or less Was there an injury with Fall?: No Fall Risk Category Calculator: 0 Patient Fall Risk Level: Low Fall Risk Patient at Risk for Falls Due to: No Fall Risks Fall risk Follow up: Falls evaluation completed, Education provided  Psychosocial Psychosocial Symptoms Reported: No symptoms reported   Major Change/Loss/Stressor/Fears (CP): Denies Techniques to Cope with Loss/Stress/Change: Spiritual practice(s), Diversional activities Quality of Family Relationships: supportive, involved, helpful Do you feel physically threatened by others?: No      12/07/2023   12:56 PM  Depression screen PHQ 2/9  Decreased Interest 0  Down, Depressed, Hopeless 0  PHQ - 2 Score 0    There were no vitals filed for this visit.  Medications Reviewed Today     Reviewed by Kaylene Pascal, RN (Registered Nurse) on 12/07/23 at 1250  Med List Status: <None>   Medication Order Taking? Sig Documenting Provider Last Dose Status Informant  Accu-Chek FastClix Lancets MISC [id number]  One tab po qd dx; e11.65 Cleave Curling, MD  Active   ACCU-CHEK GUIDE test strip [id number]  Use to check BS qd dx: E11.65 Cleave Curling, MD  Active   Arginine 500 MG CAPS [id number] Yes Take by mouth daily. Daily [provider] Taking Active Self  aspirin 81 MG tablet [id number] Yes Take 81 mg by mouth daily. [provider] Taking Active  cholecalciferol (VITAMIN D) 1000 UNITS tablet [id number] Yes Take 4,000 Units by mouth daily. [provider] Taking Active   Continuous Glucose Sensor (FREESTYLE LIBRE 3 PLUS SENSOR) MISC [id number]  Change sensor every 15 days.  Patient not taking: Reported on 11/08/2023   Cleave Curling, MD  Consider Medication Status and Discontinue (Patient Preference)   Cyanocobalamin  (VITAMIN B 12) 250 MCG LOZG [id number] Yes Take 1 tablet by mouth daily. [provider] Taking Active   Empagliflozin-metFORMIN  HCl (SYNJARDY ) 12.11-998 MG TABS [id number] Yes Take 1 tablet by mouth 2 (two) times daily. Cleave Curling, MD Taking Active   fluticasone  (CUTIVATE ) 0.05 % cream [id number] Yes APPLY TO THE AFFECTED AREA(S) TOPICALLY TWICE DAILY. Cleave Curling, MD Taking Active   [facility] EXTERNAL MEDICATION [id number] Yes Take 1 capsule by mouth daily. Manson Seitz from [facility] [provider] Taking Active   levocetirizine (XYZAL ) 5 MG tablet [id number] Yes TAKE ONE TABLET BY MOUTH IN THE EVENING. Cleave Curling, MD Taking Active   Multiple Vitamin (MULTIVITAMIN) tablet [id number] Yes Take 1 tablet by mouth daily. [provider] Taking Active   olmesartan -hydrochlorothiazide (BENICAR  HCT) 20-12.5 MG tablet [id number] Yes Take 1 tablet by mouth once daily Cleave Curling, MD Taking Active   rosuvastatin  (CRESTOR ) 10 MG tablet [id number] Yes TAKE 1 TABLET BY MOUTH EVERY MONDAY, TUESDAY, WEDNESDAY, THURS, AND FRIDAY [name], Robyn, MD Taking Active   Semaglutide  (RYBELSUS ) 7 MG TABS [id number] Yes Take 1 tablet (7 mg total) by mouth daily. Cleave Curling, MD Taking Active   tadalafil  (CIALIS ) 5 MG tablet [id number] Yes TAKE 1 TABLET BY MOUTH ONCE A DAY. Cleave Curling, MD Taking Active   UNABLE TO FIND [id number]  Take 1 capsule by mouth daily at 2 am. Manson Seitz [provider]  Consider Medication Status and Discontinue (Patient Preference)             Recommendation:   PCP Follow-up  Follow Up Plan:   Closing From:  Complex Care Management  Louanne Roussel RN BSN CCM [facility]  [facility], [facility] Nurse Care Coordinator  Direct Dial: [phone] Website: [email or web address]

## 2023-12-11 NOTE — Assessment & Plan Note (Signed)
 Chronic,LDL goal < 70.  He will c/w rosuvastatin  10mg  daily. He is encouraged to follow a heart healthy lifestyle.

## 2023-12-11 NOTE — Assessment & Plan Note (Signed)
 Chronic, controlled. EKG performed, NSR w/ frequent PACs, nonspecific T abnormality. He is encouraged to stay well hydrated and to take meds as prescribed - check electrolytes - Follow low sodium diet - Follow up in six months

## 2023-12-18 ENCOUNTER — Telehealth: Payer: Self-pay | Admitting: Pharmacist

## 2023-12-18 DIAGNOSIS — E7800 Pure hypercholesterolemia, unspecified: Secondary | ICD-10-CM

## 2023-12-18 MED ORDER — ROSUVASTATIN CALCIUM 10 MG PO TABS: ORAL_TABLET | ORAL | 3 refills | Status: DC

## 2023-12-18 NOTE — Progress Notes (Signed)
   12/18/2023  Patient ID: Andres [name], male   DOB: 08/20/1950, 73 y.o.   MRN: [id number] Pharmacy Quality Measure Review  This patient is appearing on a report for being at risk of failing the adherence measure for cholesterol (statin) medications this calendar year.   Medication: Rosuvastatin  10 mg daily  Last fill date: 12/07/23 for 30 day supply  Called Patient. He confirmed he is NOT receiving pill packs and that he was unsure as to why [place] is not filling the 90 day supply that was sent. Prescription was sent for a 90 day supply in October of last year. Will resend 90 day supply today.  Geronimo Krabbe, PharmD, BCACP Clinical Pharmacist [phone]

## 2023-12-28 ENCOUNTER — Other Ambulatory Visit: Payer: Self-pay | Admitting: Internal Medicine

## 2024-01-09 ENCOUNTER — Telehealth: Payer: Self-pay | Admitting: Pharmacist

## 2024-01-09 DIAGNOSIS — E1122 Type 2 diabetes mellitus with diabetic chronic kidney disease: Secondary | ICD-10-CM

## 2024-01-09 NOTE — Progress Notes (Signed)
   01/09/2024  Patient ID: Andres [name], male   DOB: 1950-12-23, 73 y.o.   MRN: [id number]  Received a message from Richerd Lemmings, CMA inquiring about Andres [name]  7 mg tablets.  A call was placed to [facility] today.  The Representative confirmed the Patient's medication should arrive this week. He will receive four bottles of [name]  7 mg tablets.  Plan: Follow up on shipment delivery next week.   Cassius DOROTHA Brought, PharmD, BCACP Clinical Pharmacist [phone]

## 2024-01-17 ENCOUNTER — Other Ambulatory Visit: Payer: Self-pay | Admitting: Internal Medicine

## 2024-01-17 DIAGNOSIS — I129 Hypertensive chronic kidney disease with stage 1 through stage 4 chronic kidney disease, or unspecified chronic kidney disease: Secondary | ICD-10-CM

## 2024-04-15 ENCOUNTER — Ambulatory Visit: Admitting: Internal Medicine

## 2024-04-15 ENCOUNTER — Encounter: Payer: Self-pay | Admitting: Internal Medicine

## 2024-04-15 VITALS — BP 100/60 | HR 94 | Temp 98.6°F | Ht 72.0 in | Wt 209.0 lb

## 2024-04-15 DIAGNOSIS — K219 Gastro-esophageal reflux disease without esophagitis: Secondary | ICD-10-CM | POA: Diagnosis not present

## 2024-04-15 DIAGNOSIS — R634 Abnormal weight loss: Secondary | ICD-10-CM

## 2024-04-15 DIAGNOSIS — E1122 Type 2 diabetes mellitus with diabetic chronic kidney disease: Secondary | ICD-10-CM | POA: Diagnosis not present

## 2024-04-15 DIAGNOSIS — N182 Chronic kidney disease, stage 2 (mild): Secondary | ICD-10-CM | POA: Diagnosis not present

## 2024-04-15 DIAGNOSIS — E7800 Pure hypercholesterolemia, unspecified: Secondary | ICD-10-CM | POA: Diagnosis not present

## 2024-04-15 DIAGNOSIS — I129 Hypertensive chronic kidney disease with stage 1 through stage 4 chronic kidney disease, or unspecified chronic kidney disease: Secondary | ICD-10-CM | POA: Diagnosis not present

## 2024-04-15 DIAGNOSIS — Z23 Encounter for immunization: Secondary | ICD-10-CM | POA: Diagnosis not present

## 2024-04-15 DIAGNOSIS — Z794 Long term (current) use of insulin: Secondary | ICD-10-CM

## 2024-04-15 LAB — CMP14+EGFR
ALT: 15 IU/L (ref 0–44)
AST: 17 IU/L (ref 0–40)
Albumin: 4.3 g/dL (ref 3.8–4.8)
Alkaline Phosphatase: 70 IU/L (ref 47–123)
BUN/Creatinine Ratio: 13 (ref 10–24)
BUN: 16 mg/dL (ref 8–27)
Bilirubin Total: 0.3 mg/dL (ref 0.0–1.2)
CO2: 23 mmol/L (ref 20–29)
Calcium: 9.5 mg/dL (ref 8.6–10.2)
Chloride: 96 mmol/L (ref 96–106)
Creatinine, Ser: 1.24 mg/dL (ref 0.76–1.27)
Globulin, Total: 3 g/dL (ref 1.5–4.5)
Glucose: 93 mg/dL (ref 70–99)
Potassium: 4.5 mmol/L (ref 3.5–5.2)
Sodium: 136 mmol/L (ref 134–144)
Total Protein: 7.3 g/dL (ref 6.0–8.5)
eGFR: 62 mL/min/1.73 (ref >59)

## 2024-04-15 LAB — CBC
Hematocrit: 43.4 % (ref 37.5–51.0)
Hemoglobin: 13.6 g/dL (ref 13.0–17.7)
MCH: 29.6 pg (ref 26.6–33.0)
MCHC: 31.3 g/dL — ABNORMAL LOW (ref 31.5–35.7)
MCV: 95 fL (ref 79–97)
Platelets: 251 x10E3/uL (ref 150–450)
RBC: 4.59 x10E6/uL (ref 4.14–5.80)
RDW: 12.3 % (ref 11.6–15.4)
WBC: 8.5 x10E3/uL (ref 3.4–10.8)

## 2024-04-15 LAB — HEMOGLOBIN A1C
Est. average glucose Bld gHb Est-mCnc: 131 mg/dL
Hgb A1c MFr Bld: 6.2 % — ABNORMAL HIGH (ref 4.8–5.6)

## 2024-04-15 LAB — TSH: TSH: 2.38 u[IU]/mL (ref 0.450–4.500)

## 2024-04-15 MED ORDER — ROSUVASTATIN CALCIUM 10 MG PO TABS: ORAL_TABLET | ORAL | 3 refills | Status: AC

## 2024-04-15 MED ORDER — OLMESARTAN MEDOXOMIL-HCTZ 20-12.5 MG PO TABS: 1.0000 | ORAL_TABLET | Freq: Every day | ORAL | 2 refills | Status: AC

## 2024-04-15 MED ORDER — FAMOTIDINE 20 MG PO TABS: ORAL_TABLET | ORAL | 1 refills | Status: AC

## 2024-04-15 NOTE — Progress Notes (Signed)
 I,[name], CMA,acting as a Neurosurgeon for Andres LOISE Slocumb, MD.,have documented all relevant documentation on the behalf of Andres LOISE Slocumb, MD,as directed by  Andres LOISE Slocumb, MD while in the presence of Andres LOISE Slocumb, MD.  Subjective:  Patient ID: Andres [name] , male    DOB: March 15, 1951 , 73 y.o.   MRN: [id number]  Chief Complaint  Patient presents with   Diabetes    He presents for DM/HTN follow-up. He reports compliance with meds. He denies having any headaches, chest pain and shortness of breath. He is concerned about throwing up randomly. This has been going on for a month. He reports after he eats he sneezes 4 times.     Hypertension   Weight Loss    He is concerned about losing a lot of weight he stated in May he was 219. He feels like he has been losing muscle mass.     HPI Discussed the use of AI scribe software for clinical note transcription with the patient, who gave verbal consent to proceed.  History of Present Illness Andres [name] is a 73 year old male with diabetes and hypertension who presents for a diabetes and blood pressure check.  His blood sugar levels have been fluctuating, typically staying in the 110-118 mg/dL range. He did not check his blood sugar on Saturday but noted it was 114 mg/dL on Friday night. No episodes of hypoglycemia, with levels not dropping below 70 mg/dL. He is currently taking Synjardy  XR twice a day and Rybelsus  7 mg for diabetes management.  He has experienced a weight loss from 219 lbs to 209 lbs since May, which he finds concerning. He is concerned that the weight loss may be related to his blood sugar levels. He has had episodes of vomiting twice in the past month, occurring 3-4 hours after eating, without associated heartburn. He also notes sneezing four times after meals, regardless of the type of food or location.  He mentions loose stools occurring more frequently than hard stools, but he cannot determine if this started after  beginning Rybelsus . No blood in stools. He has previously completed stool cards and his blood count was normal in May.  He is physically active, having completed a 12-week exercise program at the [facility] and continues to exercise at a [employer] fitness center. He has not felt weak despite the weight loss but is concerned about losing body mass.  He is taking olmesartan /hydrochlorothiazide for blood pressure, rosuvastatin  10 mg, and aspirin. He also uses Xyzal  as needed for allergies. He receives his medications from [facility] and [facility], with some medications being more affordable at [facility].   Diabetes He presents for his follow-up diabetic visit. He has type 2 diabetes mellitus. His disease course has been stable. There are no hypoglycemic associated symptoms. Pertinent negatives for diabetes include no blurred vision, no polydipsia, no polyphagia and no polyuria. There are no hypoglycemic complications. Diabetic complications include nephropathy. Risk factors for coronary artery disease include diabetes mellitus, dyslipidemia, hypertension and male sex. He is following a diabetic diet. His breakfast blood glucose is taken between 8-9 am. His breakfast blood glucose range is generally 90-110 mg/dl. An ACE inhibitor/angiotensin II receptor blocker is being taken. Eye exam is not current.  Hypertension This is a chronic problem. The current episode started more than 1 year ago. The problem has been gradually improving since onset. The problem is controlled. Pertinent negatives include no blurred vision. Past treatments include angiotensin blockers and diuretics.  The current treatment provides moderate improvement.     Past Medical History:  Diagnosis Date   Diabetes (HCC)    High cholesterol    Hypertension    states under control with med., has been on med. x 5 yr.   Seasonal allergies    Umbilical hernia 09/2014     Family History  Problem Relation Age of Onset   Healthy Mother    Early  death Father      Current Outpatient Medications:    Accu-Chek FastClix Lancets MISC, One tab po qd dx; e11.65, Disp: 100 each, Rfl: 11   ACCU-CHEK GUIDE test strip, Use to check BS qd dx: E11.65, Disp: 100 each, Rfl: 11   Arginine 500 MG CAPS, Take by mouth daily. Daily, Disp: , Rfl:    aspirin 81 MG tablet, Take 81 mg by mouth daily., Disp: , Rfl:    cholecalciferol (VITAMIN D) 1000 UNITS tablet, Take 4,000 Units by mouth daily., Disp: , Rfl:    Continuous Glucose Sensor (FREESTYLE LIBRE 3 PLUS SENSOR) MISC, Change sensor every 15 days., Disp: 6 each, Rfl: 3   Cyanocobalamin  (VITAMIN B 12) 250 MCG LOZG, Take 1 tablet by mouth daily., Disp: , Rfl:    famotidine (PEPCID) 20 MG tablet, One tab po every day prn, Disp: 30 tablet, Rfl: 1   fluticasone  (CUTIVATE ) 0.05 % cream, APPLY TO THE AFFECTED AREA(S) TOPICALLY TWICE DAILY., Disp: 60 g, Rfl: 1   GENERIC EXTERNAL MEDICATION, Take 1 capsule by mouth daily. [place] from [employer], Disp: , Rfl:    levocetirizine (XYZAL ) 5 MG tablet, TAKE ONE TABLET BY MOUTH IN THE EVENING., Disp: 90 tablet, Rfl: 1   Multiple Vitamin (MULTIVITAMIN) tablet, Take 1 tablet by mouth daily., Disp: , Rfl:    Semaglutide  (RYBELSUS ) 7 MG TABS, Take 1 tablet (7 mg total) by mouth daily., Disp: 90 tablet, Rfl: 1   SYNJARDY  XR 12.11-998 MG TB24, Take 1 tablet by mouth twice a day., Disp: 180 tablet, Rfl: 2   tadalafil  (CIALIS ) 5 MG tablet, TAKE 1 TABLET BY MOUTH ONCE A DAY., Disp: 30 tablet, Rfl: 2   UNABLE TO FIND, Take 1 capsule by mouth daily at 2 am. Mervin Masters, Disp: , Rfl:    olmesartan -hydrochlorothiazide (BENICAR  HCT) 20-12.5 MG tablet, Take 1 tablet by mouth daily., Disp: 90 tablet, Rfl: 2   rosuvastatin  (CRESTOR ) 10 MG tablet, TAKE 1 TABLET BY MOUTH EVERY MONDAY, TUESDAY, WEDNESDAY, THURS, AND FRIDAY (Dispense 90 day supply), Disp: 75 tablet, Rfl: 3   No Known Allergies   Review of Systems  Constitutional:  Positive for unexpected weight change.  Eyes: Negative.   Negative for blurred vision.  Respiratory: Negative.    Cardiovascular: Negative.   Gastrointestinal:        He reports change in stool habits, possibly triggered by start of Rybelsus   Endocrine: Negative for polydipsia, polyphagia and polyuria.  Musculoskeletal: Negative.   Skin: Negative.   Psychiatric/Behavioral: Negative.       Today's Vitals   04/15/24 1013  BP: 100/60  Pulse: 94  Temp: 98.6 F (37 C)  TempSrc: Oral  Weight: 209 lb (94.8 kg)  Height: 6' (1.829 m)  PainSc: 0-No pain   Body mass index is 28.35 kg/m.  Wt Readings from Last 3 Encounters:  04/15/24 209 lb (94.8 kg)  12/05/23 219 lb (99.3 kg)  11/08/23 219 lb 12.8 oz (99.7 kg)    The ASCVD Risk score (Arnett DK, et al., 2019) failed to calculate for  the following reasons:   The valid total cholesterol range is 130 to 320 mg/dL  BP Readings from Last 3 Encounters:  04/15/24 100/60  12/05/23 112/72  11/08/23 108/60    Objective:  Physical Exam Vitals and nursing note reviewed.  Constitutional:      Appearance: Normal appearance.  HENT:     Head: Normocephalic and atraumatic.  Eyes:     Extraocular Movements: Extraocular movements intact.  Cardiovascular:     Rate and Rhythm: Normal rate and regular rhythm.     Heart sounds: Normal heart sounds.  Pulmonary:     Effort: Pulmonary effort is normal.     Breath sounds: Normal breath sounds.  Musculoskeletal:     Cervical back: Normal range of motion.  Skin:    General: Skin is warm.  Neurological:     General: No focal deficit present.     Mental Status: He is alert.  Psychiatric:        Mood and Affect: Mood normal.         Assessment And Plan:  Type 2 diabetes mellitus with stage 2 chronic kidney disease, with long-term current use of insulin (HCC) Assessment & Plan: Chronic, blood sugar levels stable, no hypoglycemia. - Continue Synjardy  and Rybelsus . - Monitor blood sugar levels. - Ensure adequate protein intake. - Monitor symptoms  related to food intake. - Order blood work.  Orders: -     CMP14+EGFR -     Hemoglobin A1c  Hypertensive nephropathy Assessment & Plan: Blood pressure well-controlled with olmesartan  and hydrochlorothiazide. - BP on low end, no c/o dizziness or lightheadedness - Continue olmesartan  and hydrochlorothiazide for now - Send olmesartan  prescription to [facility]. - Monitor blood pressure.  Orders: -     CMP14+EGFR -     Olmesartan  Medoxomil-HCTZ; Take 1 tablet by mouth daily.  Dispense: 90 tablet; Refill: 2  Pure hypercholesterolemia Assessment & Plan: Chronic, LDL goal is less than 70.  He is currently taking rosuvastatin . - Continue rosuvastatin . - Send rosuvastatin  prescription to [facility].  Orders: -     CMP14+EGFR  Weight loss, unintentional Assessment & Plan: Weight loss of 10 pounds since May, possibly due to Rybelsus . - Order blood work for underlying causes. - Ensure adequate protein intake. - Encourage regular exercise.  Orders: -     TSH -     CBC  Gastroesophageal reflux disease without esophagitis Assessment & Plan: Intermittent episodes, possibly related to Rybelsus  or diet. - Monitor symptoms related to food intake. - Avoid large meals and lying down after eating. - Consider reducing Rybelsus  if symptoms persist. - Start Pepcid as needed.  Orders: -     Famotidine; One tab po every day prn  Dispense: 30 tablet; Refill: 1  Need for influenza vaccination -     Flu vaccine HIGH DOSE PF(Fluzone Trivalent)  Other orders -     Rosuvastatin  Calcium ; TAKE 1 TABLET BY MOUTH EVERY MONDAY, TUESDAY, WEDNESDAY, THURS, AND FRIDAY (Dispense 90 day supply)  Dispense: 75 tablet; Refill: 3   Return for controlled DM check-4 months.  Patient was given opportunity to ask questions. Patient verbalized understanding of the plan and was able to repeat key elements of the plan. All questions were answered to their satisfaction.   I, Andres LOISE Slocumb, MD, have  reviewed all documentation for this visit. The documentation on 04/15/24 for the exam, diagnosis, procedures, and orders are all accurate and complete.   IF YOU HAVE BEEN REFERRED TO A SPECIALIST, IT MAY  TAKE 1-2 WEEKS TO SCHEDULE/PROCESS THE REFERRAL. IF YOU HAVE NOT HEARD FROM US /SPECIALIST IN TWO WEEKS, PLEASE GIVE US  A CALL AT [phone] X [phone].

## 2024-04-15 NOTE — Assessment & Plan Note (Signed)
 Weight loss of 10 pounds since May, possibly due to Rybelsus . - Order blood work for underlying causes. - Ensure adequate protein intake. - Encourage regular exercise.

## 2024-04-15 NOTE — Assessment & Plan Note (Signed)
 Chronic, LDL goal is less than 70.  He is currently taking rosuvastatin . - Continue rosuvastatin . - Send rosuvastatin  prescription to [facility].

## 2024-04-15 NOTE — Assessment & Plan Note (Signed)
 Chronic, blood sugar levels stable, no hypoglycemia. - Continue Synjardy  and Rybelsus . - Monitor blood sugar levels. - Ensure adequate protein intake. - Monitor symptoms related to food intake. - Order blood work.

## 2024-04-15 NOTE — Patient Instructions (Addendum)

## 2024-04-15 NOTE — Assessment & Plan Note (Signed)
 Blood pressure well-controlled with olmesartan  and hydrochlorothiazide. - BP on low end, no c/o dizziness or lightheadedness - Continue olmesartan  and hydrochlorothiazide for now - Send olmesartan  prescription to [facility]. - Monitor blood pressure.

## 2024-04-15 NOTE — Assessment & Plan Note (Signed)
 Intermittent episodes, possibly related to Rybelsus  or diet. - Monitor symptoms related to food intake. - Avoid large meals and lying down after eating. - Consider reducing Rybelsus  if symptoms persist. - Start Pepcid as needed.

## 2024-04-16 ENCOUNTER — Ambulatory Visit: Payer: Self-pay | Admitting: Internal Medicine

## 2024-04-23 DIAGNOSIS — R399 Unspecified symptoms and signs involving the genitourinary system: Secondary | ICD-10-CM | POA: Diagnosis not present

## 2024-04-23 DIAGNOSIS — N528 Other male erectile dysfunction: Secondary | ICD-10-CM | POA: Diagnosis not present

## 2024-04-23 DIAGNOSIS — R3912 Poor urinary stream: Secondary | ICD-10-CM | POA: Diagnosis not present

## 2024-05-15 ENCOUNTER — Encounter: Payer: Self-pay | Admitting: Internal Medicine

## 2024-05-15 ENCOUNTER — Ambulatory Visit (INDEPENDENT_AMBULATORY_CARE_PROVIDER_SITE_OTHER): Admitting: Internal Medicine

## 2024-05-15 ENCOUNTER — Ambulatory Visit: Payer: Self-pay

## 2024-05-15 VITALS — BP 110/70 | HR 105 | Temp 98.3°F | Ht 72.0 in | Wt 209.2 lb

## 2024-05-15 DIAGNOSIS — H8112 Benign paroxysmal vertigo, left ear: Secondary | ICD-10-CM

## 2024-05-15 DIAGNOSIS — H6122 Impacted cerumen, left ear: Secondary | ICD-10-CM

## 2024-05-15 DIAGNOSIS — K219 Gastro-esophageal reflux disease without esophagitis: Secondary | ICD-10-CM | POA: Diagnosis not present

## 2024-05-15 DIAGNOSIS — R112 Nausea with vomiting, unspecified: Secondary | ICD-10-CM

## 2024-05-15 MED ORDER — PANTOPRAZOLE SODIUM 40 MG PO TBEC: 40.0000 mg | DELAYED_RELEASE_TABLET | Freq: Every day | ORAL | 1 refills | Status: AC

## 2024-05-15 MED ORDER — MECLIZINE HCL 12.5 MG PO TABS: 12.5000 mg | ORAL_TABLET | Freq: Three times a day (TID) | ORAL | 0 refills | Status: DC | PRN

## 2024-05-15 MED ORDER — RYBELSUS 3 MG PO TABS: ORAL_TABLET | ORAL | Status: AC

## 2024-05-15 NOTE — Progress Notes (Signed)
 I,Andres [name], CMA,acting as a neurosurgeon for Andres LOISE Slocumb, MD.,have documented all relevant documentation on the behalf of Andres LOISE Slocumb, MD,as directed by  Andres LOISE Slocumb, MD while in the presence of Andres LOISE Slocumb, MD.  Subjective:  Patient ID: Andres [name] , male    DOB: 12/28/1950 , 73 y.o.   MRN: [id number]  Chief Complaint  Patient presents with   Dizziness    Patient presents today for dizziness & vomiting episodes. He reports a recent episode of going to his sisters home to change the faucet under the sink. He tried going under the sink soon after he felt his head  swimming he sat on the bathroom floor and waited for a little then threw up twice. He then sat around for a little, had some water. More time goes by & he throws up again, just water and phlegm. He has had 6 vomiting episodes in 3 days. Along with dizziness prior to vomiting.     HPI Discussed the use of AI scribe software for clinical note transcription with the patient, who gave verbal consent to proceed.  History of Present Illness Andres [name] is a 73 year old male who presents with nausea, vomiting, and dizziness.  He has been experiencing nausea, vomiting, and dizziness since Monday, which began while he was at his sister's house fixing her sink. The dizziness was triggered when he was lying on the floor and reaching up, described as 'really dizzy'. The patient reports that dizziness occurs when lying down and that he feels better after sitting up. He vomited twice on Monday and experienced severe dizziness, making it difficult to get up.  He recalls a previous visit on October 6th for nausea and vomiting without dizziness, for which he was prescribed famotidine, taken daily. Despite this, nausea and vomiting have persisted, which he associates with Rybelsus , taken first thing in the morning. He vomited 15 minutes after taking Rybelsus  yesterday morning.  No fever, chills, recent cold symptoms, or  cough. He drove himself to the appointment and was not dizzy while driving, but dizziness occurs when lying down. No chest pain associated with dizziness. He reports sweating, attributing it to his usual response rather than feeling hot. He has not checked his blood sugar recently.   Diabetes He presents for his follow-up diabetic visit. He has type 2 diabetes mellitus. His disease course has been stable. Hypoglycemia symptoms include dizziness. Pertinent negatives for diabetes include no blurred vision, no chest pain, no polydipsia, no polyphagia and no polyuria. There are no hypoglycemic complications. Diabetic complications include nephropathy. Risk factors for coronary artery disease include diabetes mellitus, dyslipidemia, hypertension and male sex. He is following a diabetic diet. His breakfast blood glucose is taken between 8-9 am. His breakfast blood glucose range is generally 90-110 mg/dl. An ACE inhibitor/angiotensin II receptor blocker is being taken. Eye exam is not current.  Hypertension This is a chronic problem. The current episode started more than 1 year ago. The problem has been gradually improving since onset. The problem is controlled. Pertinent negatives include no blurred vision, chest pain, palpitations or shortness of breath. Past treatments include angiotensin blockers and diuretics. The current treatment provides moderate improvement.     Past Medical History:  Diagnosis Date   Diabetes (HCC)    High cholesterol    Hypertension    states under control with med., has been on med. x 5 yr.   Seasonal allergies    Umbilical hernia 09/2014  Family History  Problem Relation Age of Onset   Healthy Mother    Early death Father      Current Outpatient Medications:    Accu-Chek FastClix Lancets MISC, One tab po qd dx; e11.65, Disp: 100 each, Rfl: 11   ACCU-CHEK GUIDE test strip, Use to check BS qd dx: E11.65, Disp: 100 each, Rfl: 11   Arginine 500 MG CAPS, Take by mouth  daily. Daily, Disp: , Rfl:    aspirin 81 MG tablet, Take 81 mg by mouth daily., Disp: , Rfl:    cholecalciferol (VITAMIN D) 1000 UNITS tablet, Take 4,000 Units by mouth daily., Disp: , Rfl:    Continuous Glucose Sensor (FREESTYLE LIBRE 3 PLUS SENSOR) MISC, Change sensor every 15 days., Disp: 6 each, Rfl: 3   Cyanocobalamin  (VITAMIN B 12) 250 MCG LOZG, Take 1 tablet by mouth daily., Disp: , Rfl:    famotidine (PEPCID) 20 MG tablet, One tab po every day prn, Disp: 30 tablet, Rfl: 1   fluticasone  (CUTIVATE ) 0.05 % cream, APPLY TO THE AFFECTED AREA(S) TOPICALLY TWICE DAILY., Disp: 60 g, Rfl: 1   GENERIC EXTERNAL MEDICATION, Take 1 capsule by mouth daily. Sea Moss from [place], Disp: , Rfl:    levocetirizine (XYZAL ) 5 MG tablet, TAKE ONE TABLET BY MOUTH IN THE EVENING., Disp: 90 tablet, Rfl: 1   meclizine (ANTIVERT) 12.5 MG tablet, Take 1 tablet (12.5 mg total) by mouth 3 (three) times daily as needed for dizziness., Disp: 30 tablet, Rfl: 0   Multiple Vitamin (MULTIVITAMIN) tablet, Take 1 tablet by mouth daily., Disp: , Rfl:    olmesartan -hydrochlorothiazide (BENICAR  HCT) 20-12.5 MG tablet, Take 1 tablet by mouth daily., Disp: 90 tablet, Rfl: 2   pantoprazole (PROTONIX) 40 MG tablet, Take 1 tablet (40 mg total) by mouth daily., Disp: 30 tablet, Rfl: 1   rosuvastatin  (CRESTOR ) 10 MG tablet, TAKE 1 TABLET BY MOUTH EVERY MONDAY, TUESDAY, WEDNESDAY, THURS, AND FRIDAY (Dispense 90 day supply), Disp: 75 tablet, Rfl: 3   Semaglutide  (RYBELSUS ) 3 MG TABS, One tab po qd, Disp: , Rfl:    SYNJARDY  XR 12.11-998 MG TB24, Take 1 tablet by mouth twice a day., Disp: 180 tablet, Rfl: 2   tadalafil  (CIALIS ) 5 MG tablet, TAKE 1 TABLET BY MOUTH ONCE A DAY., Disp: 30 tablet, Rfl: 2   UNABLE TO FIND, Take 1 capsule by mouth daily at 2 am. Mervin Masters, Disp: , Rfl:    No Known Allergies   Review of Systems  Constitutional: Negative.   HENT: Negative.    Eyes:  Negative for blurred vision.  Respiratory: Negative.  Negative for  shortness of breath.   Cardiovascular: Negative.  Negative for chest pain and palpitations.  Gastrointestinal:  Positive for nausea and vomiting.  Endocrine: Negative for polydipsia, polyphagia and polyuria.  Skin: Negative.   Allergic/Immunologic: Negative.   Neurological:  Positive for dizziness.  Hematological: Negative.      Today's Vitals   05/15/24 1412 05/15/24 1421 05/15/24 1600  BP: 108/70 110/70 110/70  Pulse: 96 93 (!) 105  Temp: 98.3 F (36.8 C)    SpO2: 98%    Weight: 209 lb 3.2 oz (94.9 kg)    Height: 6' (1.829 m)     Body mass index is 28.37 kg/m.  Wt Readings from Last 3 Encounters:  05/15/24 209 lb 3.2 oz (94.9 kg)  04/15/24 209 lb (94.8 kg)  12/05/23 219 lb (99.3 kg)     Objective:  Physical Exam Vitals and nursing note reviewed.  Constitutional:      Appearance: Normal appearance.  HENT:     Head: Normocephalic and atraumatic.     Right Ear: Tympanic membrane, ear canal and external ear normal.     Left Ear: Ear canal and external ear normal. There is impacted cerumen.  Cardiovascular:     Rate and Rhythm: Normal rate and regular rhythm.     Heart sounds: Normal heart sounds.  Pulmonary:     Effort: Pulmonary effort is normal.     Breath sounds: Normal breath sounds.  Abdominal:     General: Bowel sounds are normal.     Palpations: Abdomen is soft.  Musculoskeletal:     Cervical back: Normal range of motion.  Skin:    General: Skin is warm.  Neurological:     General: No focal deficit present.     Mental Status: He is alert.  Psychiatric:        Mood and Affect: Mood normal.         Assessment And Plan:  Benign paroxysmal positional vertigo of left ear Assessment & Plan: Acute vertigo with nausea and vomiting, likely exacerbated by Rybelsus . Differential includes inner ear issues. - Stopped Rybelsus  for one week, restart at 3 mg next Thursday. - Prescribed pantoprazole for nausea. - Instructed on positional maneuvers for vertigo. -  Advised to contact via MyChart if nausea persists before restarting Rybelsus . - Scheduled follow-up in one month. - Consider imaging if sx persist.    Nausea and vomiting, unspecified vomiting type Assessment & Plan: Possibly exacerbated by increased dose of Rybelsus . It is likely GERD has worsened. - Decrease Rybelsus  to 3mg  daily.  - Will add pantoprazole 40mg  daily.    Left ear impacted cerumen Assessment & Plan: After obtaining verbal consent, his left ear was flushed by irrigation. No TM abnormalities were noted. He tolerated procedure well without any complications.     Orders: -     Ear Lavage  Gastroesophageal reflux disease without esophagitis Assessment & Plan: It appears his symptoms have worsened on higher dose of Rybelsus . Currently taking Pepcid prn.  - D/c Pepcid and start Pantoprazole daily - Stop eating 3 hrs prior to lying down - Avoid known triggers - Hold Rybelsus  x 7 days - Resume Rybelsus  3mg  daily - Follow up in 4 weeks.    Other orders -     Pantoprazole Sodium; Take 1 tablet (40 mg total) by mouth daily.  Dispense: 30 tablet; Refill: 1 -     Rybelsus ; One tab po qd -     Meclizine HCl; Take 1 tablet (12.5 mg total) by mouth 3 (three) times daily as needed for dizziness.  Dispense: 30 tablet; Refill: 0   Return in 4 weeks (on 06/12/2024), or f/u lower dose of rybelsus .  Patient was given opportunity to ask questions. Patient verbalized understanding of the plan and was able to repeat key elements of the plan. All questions were answered to their satisfaction.   I, Andres LOISE Slocumb, MD, have reviewed all documentation for this visit. The documentation on 05/15/24 for the exam, diagnosis, procedures, and orders are all accurate and complete.   IF YOU HAVE BEEN REFERRED TO A SPECIALIST, IT MAY TAKE 1-2 WEEKS TO SCHEDULE/PROCESS THE REFERRAL. IF YOU HAVE NOT HEARD FROM US /SPECIALIST IN TWO WEEKS, PLEASE GIVE US  A CALL AT [phone].   THE PATIENT  IS ENCOURAGED TO PRACTICE SOCIAL DISTANCING DUE TO THE COVID-19 PANDEMIC.

## 2024-05-15 NOTE — Telephone Encounter (Signed)
 FYI Only or Action Required?: FYI only for provider: appointment scheduled on 05/15/24.  Patient was last seen in primary care on 04/15/2024 by [name], MD.  Called Nurse Triage reporting Dizziness and Vomiting.  Symptoms began several weeks ago.  Interventions attempted: Rest, hydration, or home remedies.  Symptoms are: rapidly improving.  Triage Disposition: See Physician Within 24 Hours  Patient/caregiver understands and will follow disposition?: Yes  Copied from CRM #[id number]. Topic: Clinical - Red Word Triage >> May 15, 2024  9:13 AM [name] wrote: Red Word that prompted transfer to Nurse Triage: Patient was seen on 04/15/2024 and mentioned symptoms of dizziness and vomiting, patient is still experiencing symptoms but they have gotten worse since the last visit. Reason for Disposition  [1] NO dizziness now AND [2] one or more stroke risk factors (i.e., hypertension, diabetes, prior stroke/TIA/heart attack)  Answer Assessment - Initial Assessment Questions Additional info: 1) Patient was evaluated in office on 04/15/24 when he mentioned intermittent dizziness and vomiting. He is calling today to schedule an appointment because his symptoms are persisting and more frequent.  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 This recurred again a few hours later when he laid on floor again, he stopped this plumbing and he was fine remainder of day. Tuesday, Next morning took his meds then vomited, no vertigo at that time. Took acid reflux medication with relief of nausea. 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 Today he does not have vertigo.   1. DESCRIPTION: Describe your dizziness.     Past two days  2. VERTIGO: Do you  feel like either you or the room is spinning or tilting?      Room spinning  3. LIGHTHEADED: Do you feel lightheaded? (e.g., somewhat faint, woozy, weak upon standing)     Spinning sensation, was woozy yesterday.  4. SEVERITY: How bad is it?  Can you walk?     Denies symptoms at this time.  5. ONSET:  When did the dizziness begin?     Several weeks ago  6. AGGRAVATING FACTORS: Does anything make it worse? (e.g., standing, change in head position)     Laying on floor  7. CAUSE: What do you think is causing the dizziness?     Unsure cause  8. RECURRENT SYMPTOM: Have you had dizziness before? If Yes, ask: When was the last time? What happened that time?     Intermittent for several weeks  9. OTHER SYMPTOMS: Do you have any other symptoms? (e.g., earache, headache, numbness, tinnitus, vomiting, weakness)     vomiting 10. PREGNANCY: Is there any chance you are pregnant? When was your last menstrual period?  Protocols used: Dizziness - Vertigo-A-AH

## 2024-05-15 NOTE — Patient Instructions (Addendum)
 Benign Positional Vertigo Vertigo is the feeling that you or your surroundings are moving when they are not. Benign positional vertigo is the most common form of vertigo. This is usually a harmless condition (benign). This condition is positional. This means that symptoms are triggered by certain movements and positions. This condition can be dangerous if it occurs while you are doing something that could cause harm to yourself or others. This includes activities such as driving or operating machinery. What are the causes? The inner ear has fluid-filled canals that help your brain sense movement and balance. When the fluid moves, the brain receives messages about your body's position. With benign positional vertigo, calcium crystals in the inner ear break free and disturb the inner ear area. This causes your brain to receive confusing messages about your body's position. What increases the risk? You are more likely to develop this condition if: You are a woman. You are 71 years of age or older. You have recently had a head injury. You have an inner ear disease. What are the signs or symptoms? Symptoms of this condition usually happen when you move your head or your eyes in different directions. Symptoms may start suddenly and usually last for less than a minute. They include: Loss of balance and falling. Feeling like you are spinning or moving. Feeling like your surroundings are spinning or moving. Nausea and vomiting. Blurred vision. Dizziness. Involuntary eye movement (nystagmus). Symptoms can be mild and cause only minor problems, or they can be severe and interfere with daily life. Episodes of benign positional vertigo may return (recur) over time. Symptoms may also improve over time. How is this diagnosed? This condition may be diagnosed based on: Your medical history. A physical exam of the head, neck, and ears. Positional tests to check for or stimulate vertigo. You may be asked to  turn your head and change positions, such as going from sitting to lying down. A health care provider will watch for symptoms of vertigo. You may be referred to a health care provider who specializes in ear, nose, and throat problems (ENT or otolaryngologist) or a provider who specializes in disorders of the nervous system (neurologist). How is this treated?  This condition may be treated in a session in which your health care provider moves your head in specific positions to help the displaced crystals in your inner ear move. Treatment for this condition may take several sessions. Surgery may be needed in severe cases, but this is rare. In some cases, benign positional vertigo may resolve on its own in 2-4 weeks. Follow these instructions at home: Safety Move slowly. Avoid sudden body or head movements or certain positions, as told by your health care provider. Avoid driving or operating machinery until your health care provider says it is safe. Avoid doing any tasks that would be dangerous to you or others if vertigo occurs. If you have trouble walking or keeping your balance, try using a cane for stability. If you feel dizzy or unstable, sit down right away. Return to your normal activities as told by your health care provider. Ask your health care provider what activities are safe for you. General instructions Take over-the-counter and prescription medicines only as told by your health care provider. Drink enough fluid to keep your urine pale yellow. Keep all follow-up visits. This is important. Contact a health care provider if: You have a fever. Your condition gets worse or you develop new symptoms. Your family or friends notice any behavioral changes.  You have nausea or vomiting that gets worse. You have numbness or a prickling and tingling sensation. Get help right away if you: Have difficulty speaking or moving. Are always dizzy or faint. Develop severe headaches. Have weakness in  your legs or arms. Have changes in your hearing or vision. Develop a stiff neck. Develop sensitivity to light. These symptoms may represent a serious problem that is an emergency. Do not wait to see if the symptoms will go away. Get medical help right away. Call your local emergency services (911 in the [place] Do not drive yourself to the hospital. Summary Vertigo is the feeling that you or your surroundings are moving when they are not. Benign positional vertigo is the most common form of vertigo. This condition is caused by calcium crystals in the inner ear that become displaced. This causes a disturbance in an area of the inner ear that helps your brain sense movement and balance. Symptoms include loss of balance and falling, feeling that you or your surroundings are moving, nausea and vomiting, and blurred vision. This condition can be diagnosed based on symptoms, a physical exam, and positional tests. Follow safety instructions as told by your health care provider and keep all follow-up visits. This is important. This information is not intended to replace advice given to you by your health care provider. Make sure you discuss any questions you have with your health care provider. Document Revised: 01/16/2023 Document Reviewed: 01/16/2023 Elsevier Patient Education  2024 [employer].

## 2024-05-16 ENCOUNTER — Telehealth: Payer: Self-pay

## 2024-05-16 NOTE — Telephone Encounter (Signed)
 PAP: Patient assistance application for Synjardy  through [employer]) has been mailed to pt's home address on file. Provider portion of application will be faxed to provider's office.

## 2024-05-22 ENCOUNTER — Encounter: Payer: Self-pay | Admitting: Pharmacist

## 2024-05-22 DIAGNOSIS — H5203 Hypermetropia, bilateral: Secondary | ICD-10-CM | POA: Diagnosis not present

## 2024-05-22 DIAGNOSIS — H52223 Regular astigmatism, bilateral: Secondary | ICD-10-CM | POA: Diagnosis not present

## 2024-05-22 DIAGNOSIS — H524 Presbyopia: Secondary | ICD-10-CM | POA: Diagnosis not present

## 2024-05-22 NOTE — Progress Notes (Deleted)
 ?  pharmacy

## 2024-05-22 NOTE — Progress Notes (Signed)
   05/22/2024  Patient ID: Andres [name], male   DOB: [birth year], 73 y.o.   MRN: [id number]  Patient assistance forms for Synjardy  XR-completed and obtained signature from the Provider. Forms were faxed to Luke Mall, CPhT for processing and scanning.  Lab Results  Component Value Date   HGBA1C 6.2 (H) 04/15/2024   HGBA1C 6.3 (H) 12/05/2023   HGBA1C 6.9 (H) 09/05/2023    Cassius DOROTHA Brought, PharmD, BCACP Clinical Pharmacist [phone]

## 2024-05-24 NOTE — Telephone Encounter (Signed)
 Received Provider portion PAP application BI (Synjardy  XR)

## 2024-05-25 ENCOUNTER — Encounter: Payer: Self-pay | Admitting: Internal Medicine

## 2024-05-25 DIAGNOSIS — R112 Nausea with vomiting, unspecified: Secondary | ICD-10-CM | POA: Insufficient documentation

## 2024-05-25 DIAGNOSIS — H8112 Benign paroxysmal vertigo, left ear: Secondary | ICD-10-CM | POA: Insufficient documentation

## 2024-05-25 DIAGNOSIS — H6122 Impacted cerumen, left ear: Secondary | ICD-10-CM | POA: Insufficient documentation

## 2024-05-25 NOTE — Assessment & Plan Note (Signed)
 It appears his symptoms have worsened on higher dose of Rybelsus . Currently taking Pepcid prn.  - D/c Pepcid and start Pantoprazole daily - Stop eating 3 hrs prior to lying down - Avoid known triggers - Hold Rybelsus  x 7 days - Resume Rybelsus  3mg  daily - Follow up in 4 weeks.

## 2024-05-25 NOTE — Assessment & Plan Note (Addendum)
 Acute vertigo with nausea and vomiting, likely exacerbated by Rybelsus . Differential includes inner ear issues. - Stopped Rybelsus  for one week, restart at 3 mg next Thursday. - Prescribed pantoprazole for nausea. - Instructed on positional maneuvers for vertigo. - Advised to contact via MyChart if nausea persists before restarting Rybelsus . - Scheduled follow-up in one month. - Consider imaging if sx persist.

## 2024-05-25 NOTE — Assessment & Plan Note (Signed)
 After obtaining verbal consent, his left ear was flushed by irrigation. No TM abnormalities were noted. He tolerated procedure well without any complications.

## 2024-05-25 NOTE — Assessment & Plan Note (Addendum)
 Possibly exacerbated by increased dose of Rybelsus . It is likely GERD has worsened. - Decrease Rybelsus  to 3mg  daily.  - Will add pantoprazole 40mg  daily.

## 2024-06-19 ENCOUNTER — Encounter: Payer: Self-pay | Admitting: Internal Medicine

## 2024-06-19 ENCOUNTER — Ambulatory Visit: Admitting: Internal Medicine

## 2024-06-19 ENCOUNTER — Encounter: Payer: Self-pay | Admitting: Pharmacist

## 2024-06-19 VITALS — BP 110/70 | HR 94 | Temp 98.3°F | Ht 72.0 in | Wt 213.8 lb

## 2024-06-19 DIAGNOSIS — I129 Hypertensive chronic kidney disease with stage 1 through stage 4 chronic kidney disease, or unspecified chronic kidney disease: Secondary | ICD-10-CM

## 2024-06-19 DIAGNOSIS — R42 Dizziness and giddiness: Secondary | ICD-10-CM

## 2024-06-19 DIAGNOSIS — E1122 Type 2 diabetes mellitus with diabetic chronic kidney disease: Secondary | ICD-10-CM

## 2024-06-19 DIAGNOSIS — Z794 Long term (current) use of insulin: Secondary | ICD-10-CM | POA: Diagnosis not present

## 2024-06-19 DIAGNOSIS — N182 Chronic kidney disease, stage 2 (mild): Secondary | ICD-10-CM | POA: Diagnosis not present

## 2024-06-19 MED ORDER — MECLIZINE HCL 12.5 MG PO TABS: 12.5000 mg | ORAL_TABLET | Freq: Two times a day (BID) | ORAL | 0 refills | Status: AC | PRN

## 2024-06-19 NOTE — Progress Notes (Unsigned)
° °  06/19/2024 Name: Andres [name] MRN: [id number] DOB: 14-Feb-1951  Patient was in clinic today to see Dr. Jarold. She wondered if he could have a refill form completed to get Rybelsus  3mg  as his dose was decreased due to nausea. Unfortunately, [facility] stopped accepting reorders on 06/09/24.  Andres [name], CPhT called the Patient earlier today about his Jardiance application. He said he did not receive it.   The Patient Portion of the Jardiance application was completed and faxed back to Andres [name], CPhT for processing and scanning.  Lab Results  Component Value Date   HGBA1C 6.2 (H) 04/15/2024   HGBA1C 6.3 (H) 12/05/2023   HGBA1C 6.9 (H) 09/05/2023     Cassius DOROTHA Brought, PharmD, BCACP Clinical Pharmacist [phone]

## 2024-06-19 NOTE — Telephone Encounter (Signed)
 Reached out to patient regarding PAP application (BI) Synjardy  and he did not receive it- will re-mail it today.

## 2024-06-19 NOTE — Patient Instructions (Signed)

## 2024-06-19 NOTE — Progress Notes (Unsigned)
 I,[name], CMA,acting as a neurosurgeon for [name], MD.,have documented all relevant documentation on the behalf of [name], MD,as directed by  [name], MD while in the presence of [name], MD.  Subjective:  Patient ID: Andres [name] , male    DOB: Mar 18, 1951 , 73 y.o.   MRN: [id number]  Chief Complaint  Patient presents with   Diabetes    Patient presents today wanting to discuss lower dose Rybelsus  changed to lower dose at previous visit. He admits medication is doing well.  He mentions being a little dizzy on [facility] Friday upon awakening. After laying there for a little then starting to move around he felt fine.    Hypertension    HPI Discussed the use of AI scribe software for clinical note transcription with the patient, who gave verbal consent to proceed.  History of Present Illness CHAYIM BIALAS is a 73 year old male with diabetes who presents for follow-up on Rybelsus  dosage adjustment.  He has experienced improvement in gastrointestinal symptoms, specifically a reduction in vomiting, after decreasing the dose of Rybelsus . Previously, the higher dose was associated with episodes of vomiting, but since the adjustment, no further episodes have occurred.  He reports that his blood glucose levels on the lower dose of Rybelsus  are averaging around 117-118 mg/dL, compared to 875 mg/dL on the higher dose. He has not experienced significant hyperglycemia on the current dosage.  He experienced a single episode of dizziness after Thanksgiving, which resolved quickly with rest. No persistent dizziness has been noted since then.  He is currently increasing his water intake, consuming approximately three bottles a day, and has switched from soda to lemonade water, which he prepares by freezing a half-gallon and consuming it as needed.  He recently received a 90-day supply of Synjardy  and has not run out of this medication. He mentions a recent call  regarding paperwork for Synjardy  assistance, which requires additional information such as his social security number.  No current vomiting or persistent dizziness.   Diabetes He presents for his follow-up diabetic visit. He has type 2 diabetes mellitus. His disease course has been stable. There are no hypoglycemic associated symptoms. Pertinent negatives for diabetes include no blurred vision, no polydipsia, no polyphagia and no polyuria. There are no hypoglycemic complications. Diabetic complications include nephropathy. Risk factors for coronary artery disease include diabetes mellitus, dyslipidemia, hypertension and male sex. He is following a diabetic diet. His breakfast blood glucose is taken between 8-9 am. His breakfast blood glucose range is generally 90-110 mg/dl. An ACE inhibitor/angiotensin II receptor blocker is being taken. Eye exam is not current.  Hypertension This is a chronic problem. The current episode started more than 1 year ago. The problem has been gradually improving since onset. The problem is controlled. Pertinent negatives include no blurred vision. Past treatments include angiotensin blockers and diuretics. The current treatment provides moderate improvement.     Past Medical History:  Diagnosis Date   Diabetes (HCC)    High cholesterol    Hypertension    states under control with med., has been on med. x 5 yr.   Seasonal allergies    Umbilical hernia 09/2014     Family History  Problem Relation Age of Onset   Healthy Mother    Early death Father      Current Outpatient Medications:    Accu-Chek FastClix Lancets MISC, One tab po qd dx; e11.65, Disp: 100 each, Rfl: 11  ACCU-CHEK GUIDE test strip, Use to check BS qd dx: E11.65, Disp: 100 each, Rfl: 11   Arginine 500 MG CAPS, Take by mouth daily. Daily, Disp: , Rfl:    aspirin 81 MG tablet, Take 81 mg by mouth daily., Disp: , Rfl:    cholecalciferol (VITAMIN D) 1000 UNITS tablet, Take 4,000 Units by mouth  daily., Disp: , Rfl:    Continuous Glucose Sensor (FREESTYLE LIBRE 3 PLUS SENSOR) MISC, Change sensor every 15 days., Disp: 6 each, Rfl: 3   Cyanocobalamin  (VITAMIN B 12) 250 MCG LOZG, Take 1 tablet by mouth daily., Disp: , Rfl:    famotidine  (PEPCID ) 20 MG tablet, One tab po every day prn, Disp: 30 tablet, Rfl: 1   fluticasone  (CUTIVATE ) 0.05 % cream, APPLY TO THE AFFECTED AREA(S) TOPICALLY TWICE DAILY., Disp: 60 g, Rfl: 1   GENERIC EXTERNAL MEDICATION, Take 1 capsule by mouth daily. Sea Moss from [place], Disp: , Rfl:    levocetirizine (XYZAL ) 5 MG tablet, TAKE ONE TABLET BY MOUTH IN THE EVENING., Disp: 90 tablet, Rfl: 1   Multiple Vitamin (MULTIVITAMIN) tablet, Take 1 tablet by mouth daily., Disp: , Rfl:    olmesartan -hydrochlorothiazide (BENICAR  HCT) 20-12.5 MG tablet, Take 1 tablet by mouth daily., Disp: 90 tablet, Rfl: 2   pantoprazole  (PROTONIX ) 40 MG tablet, Take 1 tablet (40 mg total) by mouth daily., Disp: 30 tablet, Rfl: 1   rosuvastatin  (CRESTOR ) 10 MG tablet, TAKE 1 TABLET BY MOUTH EVERY MONDAY, TUESDAY, WEDNESDAY, THURS, AND FRIDAY (Dispense 90 day supply), Disp: 75 tablet, Rfl: 3   Semaglutide  (RYBELSUS ) 3 MG TABS, One tab po qd, Disp: , Rfl:    SYNJARDY  XR 12.11-998 MG TB24, Take 1 tablet by mouth twice a day., Disp: 180 tablet, Rfl: 2   tadalafil  (CIALIS ) 20 MG tablet, SMARTSIG:0.5-1 Tablet(s) By Mouth PRN, Disp: , Rfl:    UNABLE TO FIND, Take 1 capsule by mouth daily at 2 am. Mervin Masters, Disp: , Rfl:    meclizine  (ANTIVERT ) 12.5 MG tablet, Take 1 tablet (12.5 mg total) by mouth 2 (two) times daily as needed for dizziness., Disp: 30 tablet, Rfl: 0   No Known Allergies   Review of Systems  Constitutional: Negative.   Eyes:  Negative for blurred vision.  Respiratory: Negative.    Cardiovascular: Negative.   Gastrointestinal: Negative.   Endocrine: Negative for polydipsia, polyphagia and polyuria.  Skin: Negative.   Allergic/Immunologic: Negative.   Psychiatric/Behavioral:  Negative.       Today's Vitals   06/19/24 1449  BP: 110/70  Pulse: 94  Temp: 98.3 F (36.8 C)  SpO2: 98%  Weight: 213 lb 12.8 oz (97 kg)  Height: 6' (1.829 m)   Body mass index is 29 kg/m.  Wt Readings from Last 3 Encounters:  06/19/24 213 lb 12.8 oz (97 kg)  05/15/24 209 lb 3.2 oz (94.9 kg)  04/15/24 209 lb (94.8 kg)    The ASCVD Risk score (Arnett DK, et al., 2019) failed to calculate for the following reasons:   The valid total cholesterol range is 130 to 320 mg/dL  Objective:  Physical Exam Vitals and nursing note reviewed.  Constitutional:      Appearance: Normal appearance.  HENT:     Head: Normocephalic and atraumatic.  Eyes:     Extraocular Movements: Extraocular movements intact.  Cardiovascular:     Rate and Rhythm: Normal rate and regular rhythm.     Heart sounds: Normal heart sounds.  Pulmonary:     Effort: Pulmonary  effort is normal.     Breath sounds: Normal breath sounds.  Musculoskeletal:     Cervical back: Normal range of motion.  Skin:    General: Skin is warm.  Neurological:     General: No focal deficit present.     Mental Status: He is alert.  Psychiatric:        Mood and Affect: Mood normal.         Assessment And Plan:   Assessment & Plan Type 2 diabetes mellitus with stage 2 chronic kidney disease, with long-term current use of insulin (HCC) Blood glucose controlled on lower Rybelsus  dose. Vomiting resolved. Occasional dizziness. Insurance coverage for Rybelsus  needed next year. - Continue Rybelsus  at lower dose. - Continue Synjardy  as prescribed. - Ensure adequate hydration. - Check insurance for Rybelsus  coverage. - Provide Rybelsus  samples if available. - RTO in 2 months for next DM check Hypertensive nephropathy Blood pressure well-controlled with olmesartan  and hydrochlorothiazide. - Continue olmesartan  and hydrochlorothiazide for now - Monitor blood pressure. Vertigo Sx have lessened. Will send refill of meclizine  to use  PRN.   No orders of the defined types were placed in this encounter.  Return if symptoms worsen or fail to improve.  Patient was given opportunity to ask questions. Patient verbalized understanding of the plan and was able to repeat key elements of the plan. All questions were answered to their satisfaction.    I, [name], MD, have reviewed all documentation for this visit. The documentation on 06/23/2024 for the exam, diagnosis, procedures, and orders are all accurate and complete.   IF YOU HAVE BEEN REFERRED TO A SPECIALIST, IT MAY TAKE 1-2 WEEKS TO SCHEDULE/PROCESS THE REFERRAL. IF YOU HAVE NOT HEARD FROM US /SPECIALIST IN TWO WEEKS, PLEASE GIVE US  A CALL AT [phone] X [phone].

## 2024-06-23 NOTE — Assessment & Plan Note (Signed)
 Blood pressure well-controlled with olmesartan  and hydrochlorothiazide. - Continue olmesartan  and hydrochlorothiazide for now - Monitor blood pressure.

## 2024-06-23 NOTE — Assessment & Plan Note (Signed)
 Blood glucose controlled on lower Rybelsus  dose. Vomiting resolved. Occasional dizziness. Insurance coverage for Rybelsus  needed next year. - Continue Rybelsus  at lower dose. - Continue Synjardy  as prescribed. - Ensure adequate hydration. - Check insurance for Rybelsus  coverage. - Provide Rybelsus  samples if available. - RTO in 2 months for next DM check

## 2024-06-24 NOTE — Telephone Encounter (Signed)
 PAP: Application for Synjardy  has been submitted to [employer]), via fax

## 2024-07-01 NOTE — Telephone Encounter (Signed)
 PAP: Patient assistance application for Synjardy  has been approved by PAP Companies: [employer] from 07/11/2024 to 07/10/2025. Medication should be delivered to PAP Delivery: Home. For further shipping updates, please contact [employer] ([employer]) at [phone]. Patient ID is: [id number]

## 2024-08-20 ENCOUNTER — Ambulatory Visit: Payer: Self-pay | Admitting: Internal Medicine

## 2024-12-11 ENCOUNTER — Ambulatory Visit: Payer: Self-pay

## 2024-12-11 ENCOUNTER — Ambulatory Visit

## 2024-12-30 ENCOUNTER — Encounter: Payer: Self-pay | Admitting: Internal Medicine
